# Patient Record
Sex: Male | Born: 1985 | Race: White | Hispanic: No | Marital: Single | Smoking: Current every day smoker
Health system: Southern US, Community
[De-identification: ages and names within clinical notes are randomized; demographics above are authoritative.]

## PROBLEM LIST (undated history)

## (undated) ENCOUNTER — Emergency Department (HOSPITAL_COMMUNITY): Admission: EM | Payer: MEDICAID | Source: Home / Self Care

## (undated) DIAGNOSIS — R4584 Anhedonia: Secondary | ICD-10-CM

## (undated) DIAGNOSIS — F419 Anxiety disorder, unspecified: Secondary | ICD-10-CM

## (undated) DIAGNOSIS — G4730 Sleep apnea, unspecified: Secondary | ICD-10-CM

## (undated) DIAGNOSIS — F32A Depression, unspecified: Secondary | ICD-10-CM

## (undated) DIAGNOSIS — N489 Disorder of penis, unspecified: Secondary | ICD-10-CM

## (undated) DIAGNOSIS — R410 Disorientation, unspecified: Secondary | ICD-10-CM

## (undated) DIAGNOSIS — K219 Gastro-esophageal reflux disease without esophagitis: Secondary | ICD-10-CM

## (undated) DIAGNOSIS — F909 Attention-deficit hyperactivity disorder, unspecified type: Secondary | ICD-10-CM

## (undated) DIAGNOSIS — F329 Major depressive disorder, single episode, unspecified: Secondary | ICD-10-CM

## (undated) DIAGNOSIS — R4189 Other symptoms and signs involving cognitive functions and awareness: Secondary | ICD-10-CM

## (undated) DIAGNOSIS — I10 Essential (primary) hypertension: Secondary | ICD-10-CM

## (undated) HISTORY — DX: Anxiety disorder, unspecified: F41.9

## (undated) HISTORY — DX: Disorder of penis, unspecified: N48.9

## (undated) HISTORY — PX: FRACTURE SURGERY: SHX138

## (undated) HISTORY — DX: Disorientation, unspecified: R41.0

## (undated) HISTORY — PX: HAND SURGERY: SHX662

---
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
## 2005-04-06 ENCOUNTER — Ambulatory Visit: Payer: Self-pay | Admitting: Internal Medicine

## 2005-04-12 ENCOUNTER — Ambulatory Visit: Payer: Self-pay | Admitting: Internal Medicine

## 2005-04-20 ENCOUNTER — Ambulatory Visit: Payer: Self-pay | Admitting: Internal Medicine

## 2009-07-07 ENCOUNTER — Emergency Department (HOSPITAL_COMMUNITY): Admission: EM | Admit: 2009-07-07 | Discharge: 2009-07-07 | Payer: Self-pay | Admitting: Emergency Medicine

## 2009-11-02 ENCOUNTER — Emergency Department (HOSPITAL_COMMUNITY): Admission: EM | Admit: 2009-11-02 | Discharge: 2009-11-03 | Payer: Self-pay | Admitting: Emergency Medicine

## 2009-11-03 ENCOUNTER — Inpatient Hospital Stay (HOSPITAL_COMMUNITY): Admission: AD | Admit: 2009-11-03 | Discharge: 2009-11-04 | Payer: Self-pay | Admitting: Psychiatry

## 2009-11-03 ENCOUNTER — Ambulatory Visit: Payer: Self-pay | Admitting: Psychiatry

## 2009-11-05 ENCOUNTER — Emergency Department (HOSPITAL_COMMUNITY): Admission: EM | Admit: 2009-11-05 | Discharge: 2009-11-05 | Payer: Self-pay | Admitting: Emergency Medicine

## 2010-01-25 ENCOUNTER — Ambulatory Visit: Payer: Self-pay | Admitting: Psychiatry

## 2010-05-17 ENCOUNTER — Encounter (INDEPENDENT_AMBULATORY_CARE_PROVIDER_SITE_OTHER): Payer: Self-pay | Admitting: *Deleted

## 2010-05-17 LAB — CONVERTED CEMR LAB
ALT: 19 units/L (ref 0–53)
AST: 17 units/L (ref 0–37)
Albumin: 4.5 g/dL (ref 3.5–5.2)
Alkaline Phosphatase: 68 units/L (ref 39–117)
BUN: 12 mg/dL (ref 6–23)
CO2: 30 meq/L (ref 19–32)
Calcium: 9.8 mg/dL (ref 8.4–10.5)
Chloride: 104 meq/L (ref 96–112)
Creatinine, Ser: 0.93 mg/dL (ref 0.40–1.50)
Glucose, Bld: 90 mg/dL (ref 70–99)
HCT: 47.3 % (ref 39.0–52.0)
Hemoglobin: 15.8 g/dL (ref 13.0–17.0)
MCHC: 33.4 g/dL (ref 30.0–36.0)
MCV: 94.2 fL (ref 78.0–100.0)
Platelets: 233 10*3/uL (ref 150–400)
Potassium: 4.6 meq/L (ref 3.5–5.3)
RBC: 5.02 M/uL (ref 4.22–5.81)
RDW: 14 % (ref 11.5–15.5)
Sodium: 143 meq/L (ref 135–145)
TSH: 1.214 microintl units/mL (ref 0.350–4.500)
Total Bilirubin: 0.6 mg/dL (ref 0.3–1.2)
Total Protein: 7.2 g/dL (ref 6.0–8.3)
Vit D, 25-Hydroxy: 15 ng/mL — ABNORMAL LOW (ref 30–89)
Vitamin B-12: 328 pg/mL (ref 211–911)
WBC: 10.5 10*3/uL (ref 4.0–10.5)

## 2010-05-18 ENCOUNTER — Encounter (INDEPENDENT_AMBULATORY_CARE_PROVIDER_SITE_OTHER): Payer: Self-pay | Admitting: *Deleted

## 2010-05-18 LAB — CONVERTED CEMR LAB: Testosterone: 207.45 ng/dL — ABNORMAL LOW (ref 250–890)

## 2010-05-20 ENCOUNTER — Encounter (INDEPENDENT_AMBULATORY_CARE_PROVIDER_SITE_OTHER): Payer: Self-pay | Admitting: *Deleted

## 2010-05-20 LAB — CONVERTED CEMR LAB: HIV: NONREACTIVE

## 2010-05-27 ENCOUNTER — Encounter (INDEPENDENT_AMBULATORY_CARE_PROVIDER_SITE_OTHER): Payer: Self-pay | Admitting: *Deleted

## 2010-05-27 LAB — CONVERTED CEMR LAB
Sex Hormone Binding: 39 nmol/L (ref 13–71)
Testosterone Free: 94.4 pg/mL (ref 47.0–244.0)
Testosterone-% Free: 1.9 % (ref 1.6–2.9)
Testosterone: 498.93 ng/dL (ref 250–890)

## 2010-06-11 LAB — DIFFERENTIAL
Basophils Absolute: 0 10*3/uL (ref 0.0–0.1)
Basophils Absolute: 0 K/uL (ref 0.0–0.1)
Basophils Relative: 0 % (ref 0–1)
Basophils Relative: 0 % (ref 0–1)
Eosinophils Absolute: 0 10*3/uL (ref 0.0–0.7)
Eosinophils Absolute: 0 K/uL (ref 0.0–0.7)
Eosinophils Relative: 0 % (ref 0–5)
Eosinophils Relative: 0 % (ref 0–5)
Lymphocytes Relative: 12 % (ref 12–46)
Lymphocytes Relative: 16 % (ref 12–46)
Lymphs Abs: 1.8 K/uL (ref 0.7–4.0)
Lymphs Abs: 1.9 10*3/uL (ref 0.7–4.0)
Monocytes Absolute: 1 10*3/uL (ref 0.1–1.0)
Monocytes Absolute: 1.1 K/uL — ABNORMAL HIGH (ref 0.1–1.0)
Monocytes Relative: 7 % (ref 3–12)
Monocytes Relative: 8 % (ref 3–12)
Neutro Abs: 12.4 K/uL — ABNORMAL HIGH (ref 1.7–7.7)
Neutro Abs: 8.8 10*3/uL — ABNORMAL HIGH (ref 1.7–7.7)
Neutrophils Relative %: 75 % (ref 43–77)
Neutrophils Relative %: 81 % — ABNORMAL HIGH (ref 43–77)

## 2010-06-11 LAB — CBC
HCT: 46.3 % (ref 39.0–52.0)
HCT: 48.1 % (ref 39.0–52.0)
Hemoglobin: 16.3 g/dL (ref 13.0–17.0)
Hemoglobin: 16.6 g/dL (ref 13.0–17.0)
MCH: 32.2 pg (ref 26.0–34.0)
MCH: 32.3 pg (ref 26.0–34.0)
MCHC: 34.6 g/dL (ref 30.0–36.0)
MCHC: 35.2 g/dL (ref 30.0–36.0)
MCV: 91.7 fL (ref 78.0–100.0)
MCV: 93.1 fL (ref 78.0–100.0)
Platelets: 215 K/uL (ref 150–400)
Platelets: 234 10*3/uL (ref 150–400)
RBC: 5.05 MIL/uL (ref 4.22–5.81)
RBC: 5.16 MIL/uL (ref 4.22–5.81)
RDW: 13.5 % (ref 11.5–15.5)
RDW: 13.7 % (ref 11.5–15.5)
WBC: 11.7 K/uL — ABNORMAL HIGH (ref 4.0–10.5)
WBC: 15.3 10*3/uL — ABNORMAL HIGH (ref 4.0–10.5)

## 2010-06-11 LAB — BASIC METABOLIC PANEL
BUN: 13 mg/dL (ref 6–23)
BUN: 8 mg/dL (ref 6–23)
CO2: 27 mEq/L (ref 19–32)
CO2: 28 mEq/L (ref 19–32)
Calcium: 9.5 mg/dL (ref 8.4–10.5)
Calcium: 9.5 mg/dL (ref 8.4–10.5)
Chloride: 101 mEq/L (ref 96–112)
Chloride: 102 mEq/L (ref 96–112)
Creatinine, Ser: 0.79 mg/dL (ref 0.4–1.5)
Creatinine, Ser: 1.04 mg/dL (ref 0.4–1.5)
GFR calc Af Amer: >60 mL/min (ref >60)
GFR calc Af Amer: >60 mL/min (ref >60)
GFR calc non Af Amer: >60 mL/min (ref >60)
GFR calc non Af Amer: >60 mL/min (ref >60)
Glucose, Bld: 103 mg/dL — ABNORMAL HIGH (ref 70–99)
Glucose, Bld: 99 mg/dL (ref 70–99)
Potassium: 3.8 mEq/L (ref 3.5–5.1)
Potassium: 4.1 mEq/L (ref 3.5–5.1)
Sodium: 138 mEq/L (ref 135–145)
Sodium: 139 mEq/L (ref 135–145)

## 2010-06-11 LAB — RAPID URINE DRUG SCREEN, HOSP PERFORMED
Amphetamines: NOT DETECTED
Amphetamines: NOT DETECTED
Barbiturates: NOT DETECTED
Barbiturates: NOT DETECTED
Benzodiazepines: NOT DETECTED
Benzodiazepines: NOT DETECTED
Cocaine: NOT DETECTED
Cocaine: NOT DETECTED
Opiates: NOT DETECTED
Opiates: NOT DETECTED
Tetrahydrocannabinol: POSITIVE — AB
Tetrahydrocannabinol: POSITIVE — AB

## 2010-06-11 LAB — TRICYCLICS SCREEN, URINE
TCA Scrn: NOT DETECTED
TCA Scrn: NOT DETECTED

## 2010-06-11 LAB — ACETAMINOPHEN LEVEL: Acetaminophen (Tylenol), Serum: <10 ug/mL — ABNORMAL LOW (ref 10–30)

## 2010-06-11 LAB — ETHANOL
Alcohol, Ethyl (B): 5 mg/dL (ref 0–10)
Alcohol, Ethyl (B): <5 mg/dL (ref 0–10)

## 2010-06-11 LAB — SALICYLATE LEVEL: Salicylate Lvl: <4 mg/dL (ref 2.8–20.0)

## 2010-07-30 ENCOUNTER — Emergency Department (HOSPITAL_COMMUNITY): Payer: Self-pay

## 2010-07-30 ENCOUNTER — Emergency Department (HOSPITAL_COMMUNITY)
Admission: EM | Admit: 2010-07-30 | Discharge: 2010-07-30 | Disposition: A | Discharge status: Home / Self Care | Payer: Self-pay | Attending: Emergency Medicine | Admitting: Emergency Medicine

## 2010-07-30 DIAGNOSIS — T07XXXA Unspecified multiple injuries, initial encounter: Secondary | ICD-10-CM | POA: Insufficient documentation

## 2010-07-30 DIAGNOSIS — S0010XA Contusion of unspecified eyelid and periocular area, initial encounter: Secondary | ICD-10-CM | POA: Insufficient documentation

## 2010-07-30 DIAGNOSIS — S62309A Unspecified fracture of unspecified metacarpal bone, initial encounter for closed fracture: Secondary | ICD-10-CM | POA: Insufficient documentation

## 2010-07-30 DIAGNOSIS — S43006A Unspecified dislocation of unspecified shoulder joint, initial encounter: Secondary | ICD-10-CM | POA: Insufficient documentation

## 2010-07-30 DIAGNOSIS — IMO0002 Reserved for concepts with insufficient information to code with codable children: Secondary | ICD-10-CM | POA: Insufficient documentation

## 2010-07-30 DIAGNOSIS — Y9229 Other specified public building as the place of occurrence of the external cause: Secondary | ICD-10-CM | POA: Insufficient documentation

## 2010-08-05 ENCOUNTER — Ambulatory Visit (HOSPITAL_BASED_OUTPATIENT_CLINIC_OR_DEPARTMENT_OTHER)
Admission: RE | Admit: 2010-08-05 | Discharge: 2010-08-05 | Disposition: A | Discharge status: Home / Self Care | Payer: Self-pay | Source: Ambulatory Visit | Attending: Orthopedic Surgery | Admitting: Orthopedic Surgery

## 2010-08-05 DIAGNOSIS — F17200 Nicotine dependence, unspecified, uncomplicated: Secondary | ICD-10-CM | POA: Insufficient documentation

## 2010-08-05 DIAGNOSIS — S62309A Unspecified fracture of unspecified metacarpal bone, initial encounter for closed fracture: Secondary | ICD-10-CM | POA: Insufficient documentation

## 2010-08-05 DIAGNOSIS — X58XXXA Exposure to other specified factors, initial encounter: Secondary | ICD-10-CM | POA: Insufficient documentation

## 2010-08-05 DIAGNOSIS — Z01812 Encounter for preprocedural laboratory examination: Secondary | ICD-10-CM | POA: Insufficient documentation

## 2010-08-05 LAB — BASIC METABOLIC PANEL
Calcium: 9.3 mg/dL (ref 8.4–10.5)
Creatinine, Ser: 0.56 mg/dL (ref 0.4–1.5)
GFR calc Af Amer: >60 mL/min (ref >60)
GFR calc non Af Amer: >60 mL/min (ref >60)
Sodium: 138 mEq/L (ref 135–145)

## 2010-08-05 LAB — HEPATIC FUNCTION PANEL
ALT: 29 U/L (ref 0–53)
AST: 26 U/L (ref 0–37)
Albumin: 3.7 g/dL (ref 3.5–5.2)
Alkaline Phosphatase: 77 U/L (ref 39–117)
Bilirubin, Direct: <0.1 mg/dL (ref 0.0–0.3)
Total Bilirubin: 0.2 mg/dL — ABNORMAL LOW (ref 0.3–1.2)
Total Protein: 7.5 g/dL (ref 6.0–8.3)
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
## 2010-08-13 ENCOUNTER — Ambulatory Visit: Payer: Self-pay | Attending: Orthopedic Surgery | Admitting: Occupational Therapy

## 2010-08-13 DIAGNOSIS — R5381 Other malaise: Secondary | ICD-10-CM | POA: Insufficient documentation

## 2010-08-13 DIAGNOSIS — IMO0001 Reserved for inherently not codable concepts without codable children: Secondary | ICD-10-CM | POA: Insufficient documentation

## 2010-08-13 DIAGNOSIS — M25549 Pain in joints of unspecified hand: Secondary | ICD-10-CM | POA: Insufficient documentation

## 2010-08-13 DIAGNOSIS — M2550 Pain in unspecified joint: Secondary | ICD-10-CM | POA: Insufficient documentation

## 2010-09-08 ENCOUNTER — Encounter: Payer: Self-pay | Admitting: Occupational Therapy

## 2010-10-01 ENCOUNTER — Emergency Department (HOSPITAL_COMMUNITY)
Admission: EM | Admit: 2010-10-01 | Discharge: 2010-10-01 | Disposition: A | Discharge status: Home / Self Care | Payer: Self-pay | Attending: Emergency Medicine | Admitting: Emergency Medicine

## 2010-10-01 DIAGNOSIS — I10 Essential (primary) hypertension: Secondary | ICD-10-CM | POA: Insufficient documentation

## 2010-10-01 DIAGNOSIS — L02419 Cutaneous abscess of limb, unspecified: Secondary | ICD-10-CM | POA: Insufficient documentation

## 2010-10-01 DIAGNOSIS — F341 Dysthymic disorder: Secondary | ICD-10-CM | POA: Insufficient documentation

## 2010-10-04 ENCOUNTER — Emergency Department (HOSPITAL_COMMUNITY)
Admission: EM | Admit: 2010-10-04 | Discharge: 2010-10-04 | Disposition: A | Discharge status: Home / Self Care | Payer: Self-pay | Attending: Emergency Medicine | Admitting: Emergency Medicine

## 2010-10-04 DIAGNOSIS — L02419 Cutaneous abscess of limb, unspecified: Secondary | ICD-10-CM | POA: Insufficient documentation

## 2010-10-04 DIAGNOSIS — Z09 Encounter for follow-up examination after completed treatment for conditions other than malignant neoplasm: Secondary | ICD-10-CM | POA: Insufficient documentation

## 2010-10-07 ENCOUNTER — Encounter: Payer: Self-pay | Admitting: Occupational Therapy

## 2010-10-29 ENCOUNTER — Inpatient Hospital Stay (INDEPENDENT_AMBULATORY_CARE_PROVIDER_SITE_OTHER)
Admission: RE | Admit: 2010-10-29 | Discharge: 2010-10-29 | Disposition: A | Discharge status: Home / Self Care | Payer: Self-pay | Source: Ambulatory Visit | Attending: Emergency Medicine | Admitting: Emergency Medicine

## 2010-10-29 DIAGNOSIS — L02429 Furuncle of limb, unspecified: Secondary | ICD-10-CM

## 2010-10-29 DIAGNOSIS — K069 Disorder of gingiva and edentulous alveolar ridge, unspecified: Secondary | ICD-10-CM

## 2010-11-15 ENCOUNTER — Emergency Department (HOSPITAL_COMMUNITY): Payer: Self-pay

## 2010-11-15 ENCOUNTER — Emergency Department (HOSPITAL_COMMUNITY)
Admission: EM | Admit: 2010-11-15 | Discharge: 2010-11-15 | Disposition: A | Discharge status: Home / Self Care | Payer: Self-pay | Attending: Emergency Medicine | Admitting: Emergency Medicine

## 2010-11-15 DIAGNOSIS — IMO0002 Reserved for concepts with insufficient information to code with codable children: Secondary | ICD-10-CM | POA: Insufficient documentation

## 2010-11-15 DIAGNOSIS — I10 Essential (primary) hypertension: Secondary | ICD-10-CM | POA: Insufficient documentation

## 2010-11-15 DIAGNOSIS — R0789 Other chest pain: Secondary | ICD-10-CM | POA: Insufficient documentation

## 2012-08-08 ENCOUNTER — Encounter (HOSPITAL_COMMUNITY): Payer: Self-pay | Admitting: Emergency Medicine

## 2012-08-08 ENCOUNTER — Emergency Department (HOSPITAL_COMMUNITY)
Admission: EM | Admit: 2012-08-08 | Discharge: 2012-08-08 | Disposition: A | Discharge status: Home / Self Care | Payer: Medicaid Other | Attending: Emergency Medicine | Admitting: Emergency Medicine

## 2012-08-08 DIAGNOSIS — Z8659 Personal history of other mental and behavioral disorders: Secondary | ICD-10-CM | POA: Insufficient documentation

## 2012-08-08 DIAGNOSIS — F411 Generalized anxiety disorder: Secondary | ICD-10-CM | POA: Insufficient documentation

## 2012-08-08 DIAGNOSIS — F329 Major depressive disorder, single episode, unspecified: Secondary | ICD-10-CM | POA: Insufficient documentation

## 2012-08-08 DIAGNOSIS — F17200 Nicotine dependence, unspecified, uncomplicated: Secondary | ICD-10-CM | POA: Insufficient documentation

## 2012-08-08 DIAGNOSIS — F419 Anxiety disorder, unspecified: Secondary | ICD-10-CM

## 2012-08-08 DIAGNOSIS — Z79899 Other long term (current) drug therapy: Secondary | ICD-10-CM | POA: Insufficient documentation

## 2012-08-08 DIAGNOSIS — F39 Unspecified mood [affective] disorder: Secondary | ICD-10-CM

## 2012-08-08 DIAGNOSIS — F3289 Other specified depressive episodes: Secondary | ICD-10-CM | POA: Insufficient documentation

## 2012-08-08 DIAGNOSIS — R4586 Emotional lability: Secondary | ICD-10-CM

## 2012-08-08 HISTORY — DX: Other symptoms and signs involving cognitive functions and awareness: R41.89

## 2012-08-08 HISTORY — DX: Attention-deficit hyperactivity disorder, unspecified type: F90.9

## 2012-08-08 HISTORY — DX: Depression, unspecified: F32.A

## 2012-08-08 HISTORY — DX: Major depressive disorder, single episode, unspecified: F32.9

## 2012-08-08 HISTORY — DX: Anxiety disorder, unspecified: F41.9

## 2012-08-08 LAB — COMPREHENSIVE METABOLIC PANEL
ALT: 38 U/L (ref 0–53)
AST: 25 U/L (ref 0–37)
Albumin: 4 g/dL (ref 3.5–5.2)
CO2: 24 mEq/L (ref 19–32)
Calcium: 9.2 mg/dL (ref 8.4–10.5)
Chloride: 100 mEq/L (ref 96–112)
Creatinine, Ser: 0.63 mg/dL (ref 0.50–1.35)
GFR calc non Af Amer: >90 mL/min (ref >90)
Sodium: 136 mEq/L (ref 135–145)

## 2012-08-08 LAB — CBC
MCH: 32.2 pg (ref 26.0–34.0)
Platelets: 251 10*3/uL (ref 150–400)
RBC: 5.15 MIL/uL (ref 4.22–5.81)
RDW: 12.6 % (ref 11.5–15.5)
WBC: 9.4 10*3/uL (ref 4.0–10.5)

## 2012-08-08 LAB — RAPID URINE DRUG SCREEN, HOSP PERFORMED
Amphetamines: NOT DETECTED
Barbiturates: NOT DETECTED
Cocaine: NOT DETECTED
Opiates: NOT DETECTED
Tetrahydrocannabinol: NOT DETECTED

## 2012-08-08 MED ORDER — ALUM & MAG HYDROXIDE-SIMETH 200-200-20 MG/5ML PO SUSP: 30.0000 mL | ORAL | Status: DC | PRN

## 2012-08-08 MED ORDER — SERTRALINE HCL 50 MG PO TABS
200.0000 mg | ORAL_TABLET | Freq: Every day | ORAL | Status: DC
  Administered 2012-08-08: 200 mg via ORAL
  Filled 2012-08-08: qty 4

## 2012-08-08 MED ORDER — LORAZEPAM 1 MG PO TABS
1.0000 mg | ORAL_TABLET | Freq: Once | ORAL | Status: AC
  Administered 2012-08-08: 1 mg via ORAL
  Filled 2012-08-08: qty 1

## 2012-08-08 MED ORDER — NICOTINE 21 MG/24HR TD PT24
21.0000 mg | MEDICATED_PATCH | Freq: Every day | TRANSDERMAL | Status: DC
  Administered 2012-08-08: 21 mg via TRANSDERMAL
  Filled 2012-08-08: qty 1

## 2012-08-08 MED ORDER — IBUPROFEN 600 MG PO TABS: 600.0000 mg | ORAL_TABLET | Freq: Three times a day (TID) | ORAL | Status: DC | PRN

## 2012-08-08 MED ORDER — GABAPENTIN 400 MG PO CAPS
800.0000 mg | ORAL_CAPSULE | Freq: Four times a day (QID) | ORAL | Status: DC
  Administered 2012-08-08: 800 mg via ORAL
  Filled 2012-08-08 (×5): qty 2

## 2012-08-08 MED ORDER — ZOLPIDEM TARTRATE 5 MG PO TABS: 5.0000 mg | ORAL_TABLET | Freq: Every evening | ORAL | Status: DC | PRN

## 2012-08-08 MED ORDER — LORAZEPAM 1 MG PO TABS: 1.0000 mg | ORAL_TABLET | Freq: Three times a day (TID) | ORAL | Status: DC | PRN

## 2012-08-08 MED ORDER — CLONAZEPAM 1 MG PO TABS: 1.0000 mg | ORAL_TABLET | Freq: Two times a day (BID) | ORAL | Status: DC | PRN

## 2012-08-08 MED ORDER — ONDANSETRON HCL 4 MG PO TABS: 4.0000 mg | ORAL_TABLET | Freq: Three times a day (TID) | ORAL | Status: DC | PRN

## 2012-08-08 MED ORDER — ACETAMINOPHEN 325 MG PO TABS: 650.0000 mg | ORAL_TABLET | ORAL | Status: DC | PRN

## 2012-08-08 NOTE — ED Provider Notes (Signed)
History     CSN: [id number]  Arrival date & time 08/08/12  4098   First MD Initiated Contact with Patient 08/08/12 [phone]      Chief Complaint  Patient presents with  . Medical Clearance    (Consider location/radiation/quality/duration/timing/severity/associated sxs/prior treatment) HPI  27 year old male with history of anxiety disorder, ADHD, and depression presents for evaluations of mood instability. Patient reports 9 months ago he took bathsalt and had a psychotic breakdown from it. Since then he has been having worsening mood instability. Describe sensations impending doom, having difficulty thinking, hearing popping noise from his head, feeling very anxious and constantly having suicidal thoughts without specific plan. States he has been living in a shed behind his aunt's house and having lack of motivation to do anything, in fear that it may brought on a psychosis episode.  He has been actively seeking for help with his doctors, and also have been on several different psychiatric medications that was prescribed to him which has not helped.  Pt sts he is having increase difficulty thinking and the "popping noise" from his head has increased in frequency, prompting him to come to ER for help.  Report having a CT scan for this popping noise 9 months ago, and it was normal.  Sts he is a social drinker, last alcohol use was yesterday. Denies any recent rec drug use.  Denies homicidal ideation.  Pt has been managed by [employer].    Past Medical History  Diagnosis Date  . Depression   . ADHD (attention deficit hyperactivity disorder)   . Anxiety disorder   . Disorganized thought process     Past Surgical History  Procedure Laterality Date  . Hand surgery      Family History  Problem Relation Age of Onset  . Hypertension Mother   . Diabetes Mother   . Cancer Father   . Arthritis Father   . Diabetes Other     History  Substance Use Topics  . Smoking status: Current Every Day  Smoker    Types: Cigarettes  . Smokeless tobacco: Not on file  . Alcohol Use: Yes     Comment: occ      Review of Systems  Constitutional:       A complete 10 system review of systems was obtained and all systems are negative except as noted in the HPI and PMH.    Allergies  Other  Home Medications   Current Outpatient Rx  Name  Route  Sig  Dispense  Refill  . gabapentin (NEURONTIN) 800 MG tablet   Oral   Take 800 mg by mouth 4 (four) times daily.         . sertraline (ZOLOFT) 100 MG tablet   Oral   Take 200 mg by mouth daily.         . Thiamine HCl (VITAMIN B-1 PO)   Oral   Take by mouth.           BP 161/109  Pulse 76  Temp(Src) 98.6 F (37 C) (Oral)  Resp 20  SpO2 98%  Physical Exam  Nursing note and vitals reviewed. Constitutional: He is oriented to person, place, and time. He appears well-developed and well-nourished. No distress.  Awake, alert, nontoxic appearance  HENT:  Head: Atraumatic.  Mouth/Throat: Oropharynx is clear and moist.  Eyes: Conjunctivae and EOM are normal. Pupils are equal, round, and reactive to light. Right eye exhibits no discharge. Left eye exhibits no discharge.  Neck: Normal range of  motion. Neck supple.  Cardiovascular: Normal rate and regular rhythm.   Pulmonary/Chest: Effort normal. No respiratory distress. He exhibits no tenderness.  Abdominal: Soft. There is no tenderness. There is no rebound.  Musculoskeletal: He exhibits no tenderness.  ROM appears intact, no obvious focal weakness  Neurological: He is alert and oriented to person, place, and time. He has normal strength. No cranial nerve deficit or sensory deficit. He displays a negative Romberg sign. Coordination and gait normal. GCS eye subscore is 4. GCS verbal subscore is 5. GCS motor subscore is 6.  Skin: Skin is warm and dry. No rash noted.  Psychiatric: He has a normal mood and affect. His speech is normal and behavior is normal. Thought content is paranoid  and delusional.    ED Course  Procedures (including critical care time)  7:59 AM Pt report not feeling himself since taking bathsalt 9 months ago. Is anxious, afraid of relapsing into another psychosis.  Having SI without plan, persistent.  Seeking help.    10:46 AM Psych hold and med rec filled.  Discussed with ACT.  My attending has evaluate pt.  Move to psych ED, telepsych placed.      Labs Reviewed  COMPREHENSIVE METABOLIC PANEL - Abnormal; Notable for the following:    Glucose, Bld 147 (*)    Total Bilirubin 0.2 (*)    All other components within normal limits  CBC  ETHANOL  URINE RAPID DRUG SCREEN (HOSP PERFORMED)   No results found.   1. Mood changes   2. Anxiety       MDM  BP 141/93  Pulse 67  Temp(Src) 98.2 F (36.8 C) (Oral)  Resp 18  SpO2 99%         Fayrene Helper, PA-C 08/08/12 1047

## 2012-08-08 NOTE — [facility] Suicide Risk Assessment (Signed)
Suicide Risk Assessment  Discharge Assessment     Demographic Factors:  Male, Adolescent or young adult, Caucasian, Low socioeconomic status and Unemployed  Mental Status Per Nursing Assessment::   On Admission:     Current Mental Status by Physician: Self-harm thoughts and NA  Loss Factors: Decrease in vocational status and Financial problems/change in socioeconomic status  Historical Factors: Impulsivity and NA  Risk Reduction Factors:   Sense of responsibility to family, Religious beliefs about death, Living with another person, especially a relative, Positive social support and Positive therapeutic relationship  Continued Clinical Symptoms:  Severe Anxiety and/or Agitation Depression:   Anhedonia Impulsivity Recent sense of peace/wellbeing Severe Alcohol/Substance Abuse/Dependencies Previous Psychiatric Diagnoses and Treatments  Cognitive Features That Contribute To Risk:  Closed-mindedness Polarized thinking    Suicide Risk:  Mild:  Suicidal ideation of limited frequency, intensity, duration, and specificity.  There are no identifiable plans, no associated intent, mild dysphoria and related symptoms, good self-control (both objective and subjective assessment), few other risk factors, and identifiable protective factors, including available and accessible social support.  Discharge Diagnoses:   AXIS I:  Major Depression, Recurrent severe, Panic Disorder, Substance Abuse and Substance Induced Mood Disorder AXIS II:  Cluster B Traits AXIS III:   Past Medical History  Diagnosis Date  . Depression   . ADHD (attention deficit hyperactivity disorder)   . Anxiety disorder   . Disorganized thought process    AXIS IV:  economic problems, occupational problems, other psychosocial or environmental problems, problems related to social environment and problems with primary support group AXIS V:  51-60 moderate symptoms  Plan Of Care/Follow-up recommendations:  Activity:   As tolerated Diet:  Regular  Is patient on multiple antipsychotic therapies at discharge:  No   Has Patient had three or more failed trials of antipsychotic monotherapy by history:  No  Recommended Plan for Multiple Antipsychotic Therapies: Not applicable  Carlisle Enke,JANARDHAHA R. 08/08/2012, 3:17 PM
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
## 2012-08-08 NOTE — ED Notes (Signed)
Waiting for MD evaluation.

## 2012-08-08 NOTE — ED Notes (Signed)
Pt states he had some kind of psychotic episode about ago  Pt states he overdosed on adderal at that time  Pt states from the past he has been living in a shed behind his aunts house and has no will to go anywhere or do anything  Pt states he has been thinking of suicide for the past 9 mths  Pt states he has been hearing "popping noises" in his head and has a pain in his head not like a headache but like he has an infection in his head  Pt speaks fast and states he is feeling very anxious  Pt is on medications and states they are managed by [facility]   Pt states he is seen somewhere else too but unclear where it is  Pt states he is having difficulty processing his thoughts and getting them organized

## 2012-08-08 NOTE — ED Provider Notes (Signed)
He is being assessed for anxiety and his own thoughts that he might have psychosis. He describes having trouble arranging his thoughts, knowing where he is going and is worried about having schizophrenia. He would like to have a brain scan to diagnose schizophrenia. He follows regularly at [facility] where he is treated with medication.  The patient has been medically cleared.  A tele- psychiatric consultation is in process.    Flint Melter, MD 08/08/12 [phone]

## 2012-08-08 NOTE — Progress Notes (Signed)
ED CM reviewed EPIC notes and imaging.  Noted pt c/o "popping noise" in his head. CM spoke with EDP, Effie Shy, questioning if popping noise is neurological in nature requiring imaging studies.  EDP voiced pt's "popping noise" is "old complaint", mentioned previously by pt and does not feel need for imaging

## 2012-08-08 NOTE — ED Notes (Signed)
Patient requesting something for anxiety, MD made aware

## 2012-08-08 NOTE — ED Notes (Signed)
Patient states it is hard for him to concentrate and remember things talked about minutes before; states he has thoughts of SI, denies HI and no VH, states he hears voices but thinks of times he used to impersonate others, he hears those voices talking to him when in the grocery store "why are you standing there", "why did you take that".... doesn't feel like getting up OOB and going outside, sun bothers him, he's not lazy, he just can't get up and get going. Doesn't have a job, doesn't go to school and is about to be homeless. UDS is negative. States he takes Zoloft and neurontin and this helped him prior to OD on Adderal 9 months ago but since OD has not helped him as well.

## 2012-08-08 NOTE — Consult Note (Signed)
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 Vivi Barrack is an 27 y.o. male.  HPI: Patient was seen and chart reviewed. Patient came to the [facility] long emergency department requesting medication management for depression and anxiety. Patient reported his anxiety was increased since his medication Zoloft was increased to 200 mg daily about a month ago. Patient endorses suicidal ideation without intentions or plans. Patient contracts for safety and wishes to get medications and follow up with outpatient care.   Pt reports he occasionally drinks a 6 pack once a month or less. 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 Patient reports symptoms of depression including: isolating, anhedonia, despondent, feelings of worthlessness, fatigue, decreased appetite with increased eating, and insomnia. Patient reports he has been compliant with his medications of zoloft and neurtonin as prescribed but feels that the medications do not seem to be working. Patient reports working for [employer] until October 2013.  Mental Status Examination: Patient appeared as per his stated age, dressed in hospital scrubs, and fairly groomed, and maintaining good eye contact. Patient has depressed and anxious mood and his affect was appropriate. He has normal rate, rhythm, and volume of speech. His thought process is linear and goal directed. Patient has chronic suicidal but denies intentions and plans. He has denied homicidal ideations, intentions or plans. Patient has no evidence of auditory or visual hallucinations, delusions, and paranoia. Patient has poor insight judgment and impulse control.  Past Medical History  Diagnosis Date  . Depression   .  ADHD (attention deficit hyperactivity disorder)   . Anxiety disorder   . Disorganized thought process     Past Surgical History  Procedure Laterality Date  . Hand surgery      Family History  Problem Relation Age of Onset  . Hypertension Mother   . Diabetes Mother   . Cancer Father   . Arthritis Father   . Diabetes Other     Social History:  reports that he has been smoking Cigarettes.  He has been smoking about 0.00 packs per day. He does not have any smokeless tobacco history on file. He reports that  drinks alcohol. He reports that he does not use illicit drugs.  Allergies:  Allergies  Allergen Reactions  . Other Other (See Comments)    Can not take adderall because of accidental overdose    Medications: I have reviewed the patient's current medications.  Results for orders placed during the hospital encounter of 08/08/12 (from the past 48 hour(s))  CBC     Status: None   Collection Time    08/08/12  3:21 AM      Result Value Range   WBC 9.4  4.0 - 10.5 K/uL   RBC 5.15  4.22 - 5.81 MIL/uL   Hemoglobin 16.6  13.0 - 17.0 g/dL   HCT 47.8  29.5 - 62.1 %   MCV 89.7  78.0 - 100.0 fL   MCH 32.2  26.0 - 34.0 pg   MCHC 35.9  30.0 - 36.0 g/dL   RDW 30.8  65.7 - 84.6 %   Platelets 251  150 - 400 K/uL  COMPREHENSIVE METABOLIC PANEL     Status: Abnormal   Collection Time    08/08/12  3:21 AM  Result Value Range   Sodium 136  135 - 145 mEq/L   Potassium 3.6  3.5 - 5.1 mEq/L   Chloride 100  96 - 112 mEq/L   CO2 24  19 - 32 mEq/L   Glucose, Bld 147 (*) 70 - 99 mg/dL   BUN 13  6 - 23 mg/dL   Creatinine, Ser 1.61  0.50 - 1.35 mg/dL   Calcium 9.2  8.4 - 09.6 mg/dL   Total Protein 7.9  6.0 - 8.3 g/dL   Albumin 4.0  3.5 - 5.2 g/dL   AST 25  0 - 37 U/L   ALT 38  0 - 53 U/L   Alkaline Phosphatase 85  39 - 117 U/L   Total Bilirubin 0.2 (*) 0.3 - 1.2 mg/dL   GFR calc non Af Amer >90  >90 mL/min   GFR calc Af Amer >90  >90 mL/min   Comment:            The eGFR has been  calculated     using the CKD EPI equation.     This calculation has not been     validated in all clinical     situations.     eGFR's persistently     <90 mL/min signify     possible Chronic Kidney Disease.  ETHANOL     Status: None   Collection Time    08/08/12  3:21 AM      Result Value Range   Alcohol, Ethyl (B) <11  0 - 11 mg/dL   Comment:            LOWEST DETECTABLE LIMIT FOR     SERUM ALCOHOL IS 11 mg/dL     FOR MEDICAL PURPOSES ONLY  URINE RAPID DRUG SCREEN (HOSP PERFORMED)     Status: None   Collection Time    08/08/12  6:30 AM      Result Value Range   Opiates NONE DETECTED  NONE DETECTED   Cocaine NONE DETECTED  NONE DETECTED   Benzodiazepines NONE DETECTED  NONE DETECTED   Amphetamines NONE DETECTED  NONE DETECTED   Tetrahydrocannabinol NONE DETECTED  NONE DETECTED   Barbiturates NONE DETECTED  NONE DETECTED   Comment:            DRUG SCREEN FOR MEDICAL PURPOSES     ONLY.  IF CONFIRMATION IS NEEDED     FOR ANY PURPOSE, NOTIFY LAB     WITHIN 5 DAYS.                LOWEST DETECTABLE LIMITS     FOR URINE DRUG SCREEN     Drug Class       Cutoff (ng/mL)     Amphetamine      1000     Barbiturate      200     Benzodiazepine   200     Tricyclics       300     Opiates          300     Cocaine          300     THC              50    No results found.  Positive for anxiety, bad mood, behavior problems, depression, illegal drug usage, learning difficulty and sleep disturbance Blood pressure 124/82, pulse 68, temperature 97.5 F (36.4 C), temperature source Oral, resp. rate 16, SpO2 97.00%.   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 Will start clonazepam 1 mg twice daily for anxiety and continue taking his current home  medication as it is.  Calisa Luckenbaugh,JANARDHAHA R. 08/08/2012, 12:46 PM

## 2012-08-08 NOTE — ED Notes (Signed)
Patient and his belongings (Clothes, shoes, two pill bottles, and insurance card) checked by security.

## 2012-08-08 NOTE — ED Notes (Signed)
Pt requesting for something to help with his nerves. Lauree Chandler PA aware. New orders given.

## 2012-08-09 NOTE — ED Provider Notes (Signed)
Medical screening examination/treatment/procedure(s) were performed by non-physician practitioner and as supervising physician I was immediately available for consultation/collaboration.  Sunnie Nielsen, MD 08/09/12 [phone]

## 2012-09-12 ENCOUNTER — Encounter (HOSPITAL_COMMUNITY): Payer: Self-pay | Admitting: *Deleted

## 2012-09-12 ENCOUNTER — Emergency Department (HOSPITAL_COMMUNITY)
Admission: EM | Admit: 2012-09-12 | Discharge: 2012-09-12 | Disposition: A | Discharge status: Home / Self Care | Payer: Medicaid Other | Attending: Emergency Medicine | Admitting: Emergency Medicine

## 2012-09-12 DIAGNOSIS — F411 Generalized anxiety disorder: Secondary | ICD-10-CM | POA: Diagnosis present

## 2012-09-12 DIAGNOSIS — F17200 Nicotine dependence, unspecified, uncomplicated: Secondary | ICD-10-CM | POA: Insufficient documentation

## 2012-09-12 DIAGNOSIS — R450 Nervousness: Secondary | ICD-10-CM | POA: Insufficient documentation

## 2012-09-12 DIAGNOSIS — F909 Attention-deficit hyperactivity disorder, unspecified type: Secondary | ICD-10-CM | POA: Insufficient documentation

## 2012-09-12 DIAGNOSIS — Z79899 Other long term (current) drug therapy: Secondary | ICD-10-CM | POA: Insufficient documentation

## 2012-09-12 DIAGNOSIS — I10 Essential (primary) hypertension: Secondary | ICD-10-CM | POA: Insufficient documentation

## 2012-09-12 DIAGNOSIS — R29818 Other symptoms and signs involving the nervous system: Secondary | ICD-10-CM | POA: Insufficient documentation

## 2012-09-12 DIAGNOSIS — F329 Major depressive disorder, single episode, unspecified: Secondary | ICD-10-CM

## 2012-09-12 DIAGNOSIS — F1910 Other psychoactive substance abuse, uncomplicated: Secondary | ICD-10-CM | POA: Insufficient documentation

## 2012-09-12 DIAGNOSIS — R45851 Suicidal ideations: Secondary | ICD-10-CM | POA: Insufficient documentation

## 2012-09-12 DIAGNOSIS — G4700 Insomnia, unspecified: Secondary | ICD-10-CM | POA: Insufficient documentation

## 2012-09-12 LAB — CBC WITH DIFFERENTIAL/PLATELET
Basophils Relative: 0 % (ref 0–1)
Eosinophils Absolute: 0.1 10*3/uL (ref 0.0–0.7)
Eosinophils Relative: 1 % (ref 0–5)
Hemoglobin: 16.6 g/dL (ref 13.0–17.0)
Lymphs Abs: 2.4 10*3/uL (ref 0.7–4.0)
MCH: 31.9 pg (ref 26.0–34.0)
MCHC: 35.9 g/dL (ref 30.0–36.0)
MCV: 89 fL (ref 78.0–100.0)
Monocytes Absolute: 1 10*3/uL (ref 0.1–1.0)
Monocytes Relative: 9 % (ref 3–12)
Neutrophils Relative %: 70 % (ref 43–77)
RBC: 5.2 MIL/uL (ref 4.22–5.81)

## 2012-09-12 LAB — SALICYLATE LEVEL: Salicylate Lvl: <2 mg/dL — ABNORMAL LOW (ref 2.8–20.0)

## 2012-09-12 LAB — BASIC METABOLIC PANEL
BUN: 9 mg/dL (ref 6–23)
Calcium: 9.9 mg/dL (ref 8.4–10.5)
Creatinine, Ser: 0.76 mg/dL (ref 0.50–1.35)
GFR calc Af Amer: >90 mL/min (ref >90)
GFR calc non Af Amer: >90 mL/min (ref >90)
Glucose, Bld: 116 mg/dL — ABNORMAL HIGH (ref 70–99)

## 2012-09-12 LAB — RAPID URINE DRUG SCREEN, HOSP PERFORMED
Barbiturates: NOT DETECTED
Cocaine: NOT DETECTED
Tetrahydrocannabinol: NOT DETECTED

## 2012-09-12 MED ORDER — LORAZEPAM 1 MG PO TABS
1.0000 mg | ORAL_TABLET | Freq: Three times a day (TID) | ORAL | Status: DC | PRN
  Administered 2012-09-12: 1 mg via ORAL
  Filled 2012-09-12: qty 1

## 2012-09-12 MED ORDER — ACETAMINOPHEN 325 MG PO TABS: 650.0000 mg | ORAL_TABLET | ORAL | Status: DC | PRN

## 2012-09-12 MED ORDER — ZOLPIDEM TARTRATE 5 MG PO TABS: 5.0000 mg | ORAL_TABLET | Freq: Every evening | ORAL | Status: DC | PRN

## 2012-09-12 MED ORDER — TRAZODONE 25 MG HALF TABLET: 25.0000 mg | ORAL_TABLET | Freq: Every evening | ORAL | Status: DC | PRN

## 2012-09-12 MED ORDER — ALUM & MAG HYDROXIDE-SIMETH 200-200-20 MG/5ML PO SUSP: 30.0000 mL | ORAL | Status: DC | PRN

## 2012-09-12 MED ORDER — VENLAFAXINE HCL 37.5 MG PO TABS: 37.5000 mg | ORAL_TABLET | Freq: Two times a day (BID) | ORAL | Status: DC

## 2012-09-12 MED ORDER — IBUPROFEN 600 MG PO TABS: 600.0000 mg | ORAL_TABLET | Freq: Three times a day (TID) | ORAL | Status: DC | PRN

## 2012-09-12 MED ORDER — TRAZODONE HCL 50 MG PO TABS: 25.0000 mg | ORAL_TABLET | Freq: Every evening | ORAL | Status: DC | PRN

## 2012-09-12 MED ORDER — ONDANSETRON HCL 4 MG PO TABS: 4.0000 mg | ORAL_TABLET | Freq: Three times a day (TID) | ORAL | Status: DC | PRN

## 2012-09-12 MED ORDER — VENLAFAXINE HCL 37.5 MG PO TABS
37.5000 mg | ORAL_TABLET | Freq: Two times a day (BID) | ORAL | Status: DC
  Filled 2012-09-12 (×2): qty 1

## 2012-09-12 NOTE — Consult Note (Signed)
Reason for Consult: Evaluation for inpatient treatment Referring Physician:  EDP  Ricky Wu is an 27 y.o. male.  HPI: Patient presents to [facility] with complaints of increased anxiety and feeling psychotic.  Patient states that he has been trying to taper himself off of his Zoloft and Gabapentin.  States that he is coming off of the medication related to medication causing sexual dysfunction.  Patient states since the taper he has been having night mares; unable to sleep; and anxiety greater that 10 on a scale of 10/10.  Patient states that he has tried multiple psychotropic medications and has not had one that did not cause sexual dysfunction.  Patient also states that he has had SI thoughts and dreams but no actual plan.  Patient denies HI/AVH, but is paranoid about the dreams and the anxiety is making him feel like he is psychotic.  Patient expresses that he feels that he can handle things on a outpatient basis just want to make sure he would have resources to help him with the taper of medications and a medication that would not cause sexual dysfunction that would also help with anxiety.  Patient states that being on a closed unit would probably increase his anxiety.   Patient willing to work with a ACT team that will see daily and Psychiatrist to manage medication at least weekly.    Past Medical History  Diagnosis Date  . Depression   . ADHD (attention deficit hyperactivity disorder)   . Anxiety disorder   . Disorganized thought process     Past Surgical History  Procedure Laterality Date  . Hand surgery      Family History  Problem Relation Age of Onset  . Hypertension Mother   . Diabetes Mother   . Cancer Father   . Arthritis Father   . Diabetes Other     Social History:  reports that he has been smoking Cigarettes.  He has been smoking about 0.00 packs per day. He does not have any smokeless tobacco history on file. He reports that  drinks alcohol. He reports that he does not  use illicit drugs.  Allergies:  Allergies  Allergen Reactions  . Other Other (See Comments)    Can not take adderall because of accidental overdose    Medications: I have reviewed the patient's current medications.  Results for orders placed during the hospital encounter of 09/12/12 (from the past 48 hour(s))  URINE RAPID DRUG SCREEN (HOSP PERFORMED)     Status: None   Collection Time    09/12/12  6:09 AM      Result Value Range   Opiates NONE DETECTED  NONE DETECTED   Cocaine NONE DETECTED  NONE DETECTED   Benzodiazepines NONE DETECTED  NONE DETECTED   Amphetamines NONE DETECTED  NONE DETECTED   Tetrahydrocannabinol NONE DETECTED  NONE DETECTED   Barbiturates NONE DETECTED  NONE DETECTED   Comment:            DRUG SCREEN FOR MEDICAL PURPOSES     ONLY.  IF CONFIRMATION IS NEEDED     FOR ANY PURPOSE, NOTIFY LAB     WITHIN 5 DAYS.                LOWEST DETECTABLE LIMITS     FOR URINE DRUG SCREEN     Drug Class       Cutoff (ng/mL)     Amphetamine      1000     Barbiturate  200     Benzodiazepine   200     Tricyclics       300     Opiates          300     Cocaine          300     THC              50  CBC WITH DIFFERENTIAL     Status: Abnormal   Collection Time    09/12/12  6:30 AM      Result Value Range   WBC 11.8 (*) 4.0 - 10.5 K/uL   RBC 5.20  4.22 - 5.81 MIL/uL   Hemoglobin 16.6  13.0 - 17.0 g/dL   HCT 30.8  65.7 - 84.6 %   MCV 89.0  78.0 - 100.0 fL   MCH 31.9  26.0 - 34.0 pg   MCHC 35.9  30.0 - 36.0 g/dL   RDW 96.2  95.2 - 84.1 %   Platelets 273  150 - 400 K/uL   Neutrophils Relative % 70  43 - 77 %   Neutro Abs 8.2 (*) 1.7 - 7.7 K/uL   Lymphocytes Relative 21  12 - 46 %   Lymphs Abs 2.4  0.7 - 4.0 K/uL   Monocytes Relative 9  3 - 12 %   Monocytes Absolute 1.0  0.1 - 1.0 K/uL   Eosinophils Relative 1  0 - 5 %   Eosinophils Absolute 0.1  0.0 - 0.7 K/uL   Basophils Relative 0  0 - 1 %   Basophils Absolute 0.0  0.0 - 0.1 K/uL  BASIC METABOLIC PANEL      Status: Abnormal   Collection Time    09/12/12  6:30 AM      Result Value Range   Sodium 137  135 - 145 mEq/L   Potassium 3.9  3.5 - 5.1 mEq/L   Chloride 100  96 - 112 mEq/L   CO2 24  19 - 32 mEq/L   Glucose, Bld 116 (*) 70 - 99 mg/dL   BUN 9  6 - 23 mg/dL   Creatinine, Ser 3.24  0.50 - 1.35 mg/dL   Calcium 9.9  8.4 - 40.1 mg/dL   GFR calc non Af Amer >90  >90 mL/min   GFR calc Af Amer >90  >90 mL/min   Comment:            The eGFR has been calculated     using the CKD EPI equation.     This calculation has not been     validated in all clinical     situations.     eGFR's persistently     <90 mL/min signify     possible Chronic Kidney Disease.  ETHANOL     Status: None   Collection Time    09/12/12  6:30 AM      Result Value Range   Alcohol, Ethyl (B) <11  0 - 11 mg/dL   Comment:            LOWEST DETECTABLE LIMIT FOR     SERUM ALCOHOL IS 11 mg/dL     FOR MEDICAL PURPOSES ONLY  SALICYLATE LEVEL     Status: Abnormal   Collection Time    09/12/12  6:30 AM      Result Value Range   Salicylate Lvl <2.0 (*) 2.8 - 20.0 mg/dL  ACETAMINOPHEN LEVEL     Status: None   Collection  Time    09/12/12  6:30 AM      Result Value Range   Acetaminophen (Tylenol), Serum <15.0  10 - 30 ug/mL   Comment:            THERAPEUTIC CONCENTRATIONS VARY     SIGNIFICANTLY. A RANGE OF 10-30     ug/mL MAY BE AN EFFECTIVE     CONCENTRATION FOR MANY PATIENTS.     HOWEVER, SOME ARE BEST TREATED     AT CONCENTRATIONS OUTSIDE THIS     RANGE.     ACETAMINOPHEN CONCENTRATIONS     >150 ug/mL AT 4 HOURS AFTER     INGESTION AND >50 ug/mL AT 12     HOURS AFTER INGESTION ARE     OFTEN ASSOCIATED WITH TOXIC     REACTIONS.    No results found.  Review of Systems  Constitutional: Negative.   HENT: Negative.   Eyes: Negative.   Respiratory: Negative.   Cardiovascular: Negative.   Gastrointestinal: Negative.   Genitourinary: Negative.   Musculoskeletal: Negative.   Neurological: Negative.    Psychiatric/Behavioral: Positive for depression, suicidal ideas and substance abuse. Negative for hallucinations. The patient is nervous/anxious and has insomnia.    Blood pressure 132/90, pulse 74, temperature 97.4 F (36.3 C), temperature source Oral, resp. rate 18, SpO2 97.00%. Physical Exam  Constitutional: He is oriented to person, place, and time. He appears well-developed.  HENT:  Head: Normocephalic.  Eyes: Pupils are equal, round, and reactive to light.  Neck: Normal range of motion.  Respiratory: Effort normal.  Musculoskeletal: Normal range of motion.  Neurological: He is alert and oriented to person, place, and time.  Skin: Skin is warm and dry.  Psychiatric: His speech is normal and behavior is normal. His mood appears anxious. Thought content is paranoid. He expresses suicidal ideation. He expresses no homicidal ideation. He expresses no suicidal plans.  Patient states that increased anxiety since he has started to trapper off of RX anxiety medication (Zoloft and Gabapentin).      Assessment/Plan:  Face to face interview and consult with Dr. Lolly Mustache Recommendation:  Outpatient treatment 1. CW will set up ACT or other outpatient service that will be able to follow patient daily for next couple      of weeks with psychiatrist to see at least weekly and manage patient medication.   2. Start Effexor 37.5 mg BID for anxiety and Trazodone 25 mg PRN QHS (may repeat dose once) for       Insomnia.  Patient accepted for [facility] with [facility] Appointment at his home 09/13/2012 10:00 AM Contact:  Risa Grill [phone]   Assunta Found, FNP-BC 09/12/2012, 12:48 PM     I personally seen the patient agreed with the findings and involved in the treatment plan.

## 2012-09-12 NOTE — ED Notes (Addendum)
Pt states having feelings of anxiety that make him feel psychotic; pt states he has been trying to reset his schedule because he is sleeping all day; tried not taking his neurontin for 3 doses to help him sleep--tried staying up all day; states slept a few hours and woke up with psychotic scary nightmares; pt states when it happens he feels unbearable and he starts to "freak out"; states anxiety/panic attacks will come in waves; he feels like he is going in and out of reality; pt states has been having thoughts of hurting himself because he can't stand this feeling when it wont go away; states about a month ago sat with a  Razor blade and considered hurting himself; pt states he is afraid his feeling will return and he will have to kill himself because he can't take the pain

## 2012-09-12 NOTE — Progress Notes (Signed)
Per discussion with NP, Patient was evaluated and psychiatrically stable for discharge with a community act team. CSW and [facility] spoke with patient who agreed to a community act team through [place] where patient is already a patient. Pt discharge pending arrangement of act team and appointment. [facility] arranging [facility] act team.   .Catha Gosselin, Theresia Majors  [phone] .09/12/2012 1036am

## 2012-09-12 NOTE — ED Notes (Signed)
Pt changed into scrubs; wanded by security 

## 2012-09-12 NOTE — ED Provider Notes (Signed)
History     CSN: [id number]  Arrival date & time 09/12/12  0540   First MD Initiated Contact with Patient 09/12/12 [phone]      Chief Complaint  Patient presents with  . Medical Clearance    (Consider location/radiation/quality/duration/timing/severity/associated sxs/prior treatment) HPI Comments: Patient is a 27 year old male with a past medical history of anxiety, depression, and ADHD who presents with unmanageable anxiety for the past 24 hours. Patient reports trying to "reset his sleep schedule" so he can sleep during the night and be awake during the day. He took 50mg  of benadryl last night to try to sleep and did not take any medications yesterday. Patient states he has been awake for 24 hours. Patient reports a history of "surreal" experiences where he is no longer in reality, which he felt like was going to happen tonight. He doesn't like these feelings because he states there is an "evil" component to them where he feels as though the devil is with him. He wants to avoid experiencing these feelings so he considered hurting himself. He reports he would shoot himself in the head with a shotgun, hang himself or cut himself with a razor blade. No homicidal ideations. Patient denies illicit drug use.    Past Medical History  Diagnosis Date  . Depression   . ADHD (attention deficit hyperactivity disorder)   . Anxiety disorder   . Disorganized thought process     Past Surgical History  Procedure Laterality Date  . Hand surgery      Family History  Problem Relation Age of Onset  . Hypertension Mother   . Diabetes Mother   . Cancer Father   . Arthritis Father   . Diabetes Other     History  Substance Use Topics  . Smoking status: Current Every Day Smoker    Types: Cigarettes  . Smokeless tobacco: Not on file  . Alcohol Use: Yes     Comment: occ      Review of Systems  Psychiatric/Behavioral: Positive for suicidal ideas. The patient is nervous/anxious.   All other  systems reviewed and are negative.    Allergies  Other  Home Medications   Current Outpatient Rx  Name  Route  Sig  Dispense  Refill  . diphenhydrAMINE (BENADRYL) 25 MG tablet   Oral   Take 50 mg by mouth every 6 (six) hours as needed for sleep.         Marland Kitchen gabapentin (NEURONTIN) 800 MG tablet   Oral   Take 800 mg by mouth 4 (four) times daily.         . Melatonin 3 MG TABS   Oral   Take 3 mg by mouth at bedtime.         . niacin 500 MG tablet   Oral   Take 500 mg by mouth daily with breakfast.         . sertraline (ZOLOFT) 100 MG tablet   Oral   Take 200 mg by mouth daily.           BP 130/97  Pulse 88  Temp(Src) 99.6 F (37.6 C)  Resp 20  SpO2 96%  Physical Exam  Nursing note and vitals reviewed. Constitutional: He is oriented to person, place, and time. He appears well-developed and well-nourished. No distress.  HENT:  Head: Normocephalic and atraumatic.  Eyes: Conjunctivae and EOM are normal.  Neck: Normal range of motion.  Cardiovascular: Normal rate and regular rhythm.  Exam reveals no  gallop and no friction rub.   No murmur heard. Pulmonary/Chest: Effort normal and breath sounds normal. He has no wheezes. He has no rales. He exhibits no tenderness.  Abdominal: Soft. He exhibits no distension. There is no tenderness. There is no rebound and no guarding.  Musculoskeletal: Normal range of motion.  Neurological: He is alert and oriented to person, place, and time.  Speech is goal-oriented. Moves limbs without ataxia.   Skin: Skin is warm and dry.  Psychiatric:  Dysphoric mood.     ED Course  Procedures (including critical care time)  Labs Reviewed  CBC WITH DIFFERENTIAL - Abnormal; Notable for the following:    WBC 11.8 (*)    Neutro Abs 8.2 (*)    All other components within normal limits  BASIC METABOLIC PANEL - Abnormal; Notable for the following:    Glucose, Bld 116 (*)    All other components within normal limits  SALICYLATE LEVEL  - Abnormal; Notable for the following:    Salicylate Lvl <2.0 (*)    All other components within normal limits  ETHANOL  URINE RAPID DRUG SCREEN (HOSP PERFORMED)  ACETAMINOPHEN LEVEL   No results found.   1. GAD (generalized anxiety disorder)   2. Suicidal ideation       MDM  6:17 AM Labs pending.        Emilia Beck, PA-C 09/12/12 1529

## 2012-09-12 NOTE — Progress Notes (Signed)
Pt states he has not seen a pcp in a long time No medicaid card scanned in [facility] ED CM spoke with pt on how to a obtain an in network medicaid pcp via DSS

## 2012-09-12 NOTE — ED Notes (Signed)
Pt transferred from triage, presents with SI, plan to strangle or hang himself, shoot himself with a gun.  Denies HI.  Pt reports hearing voices, stating he is not safe.. REports having nightmares. Admits to using bathsalts in the past.  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  States clean for past 10 mos.  Pt calm & cooperative.

## 2012-09-14 NOTE — ED Provider Notes (Signed)
Medical screening examination/treatment/procedure(s) were performed by non-physician practitioner and as supervising physician I was immediately available for consultation/collaboration.  Gregorey Nabor, MD 09/14/12 0743 

## 2012-12-18 ENCOUNTER — Encounter: Payer: Self-pay | Admitting: Neurology

## 2012-12-18 ENCOUNTER — Ambulatory Visit (INDEPENDENT_AMBULATORY_CARE_PROVIDER_SITE_OTHER): Payer: Medicaid Other | Admitting: Neurology

## 2012-12-18 VITALS — BP 122/80 | HR 72 | Ht 72.0 in | Wt 255.0 lb

## 2012-12-18 DIAGNOSIS — R410 Disorientation, unspecified: Secondary | ICD-10-CM | POA: Insufficient documentation

## 2012-12-18 DIAGNOSIS — F29 Unspecified psychosis not due to a substance or known physiological condition: Secondary | ICD-10-CM

## 2012-12-18 DIAGNOSIS — F411 Generalized anxiety disorder: Secondary | ICD-10-CM

## 2012-12-18 NOTE — Progress Notes (Signed)
[facility]  PATIENT: Ricky Wu DOB: 1985/09/09  HISTORICAL Ricky Wu is a 27 years old right-handed Caucasian male, referred by his primary care physician Truddie Coco for evaluation of constellation of complains    He had past medical history of depression, anxiety, also with diagnosis of ADHD, he was treated with Adderall in the summer of 2013, he has a history of substance abuse, including marijuana, street drugs, during that period of time, he went through a lot of stress, with worsening depression anxiety, he reported  tendency to abuse Adderall of escalating doses, up to 100 mg a day, he began to feel strange sensation, worsening anxiety,  Despite being clean from prescription, and street drug use, over the past few months, he continued to feel very depressed, word finding difficulties, difficulty with voice control, anxiety, he worry about the permanent brain damage from previous drug use  He is also evaluated by psychiatrist, is taking trazodone, also Neurontin 800 mg 4 times a day, propranolol 40 mg twice a day, which all failed to control his symptoms,  REVIEW OF SYSTEMS: Full 14 system review of systems performed and notable only for weight gain, fatigue, ringing ears, spinning sensation, blurred vision, double vision, shortness of breath, wheezing, constipation, easy bruising, feeling hot, increased thirst, flushing, joint pain, achy muscles, memory loss, confusion, numbness, weakness, dizziness, tremor, insomnia, sleepiness, restless leg, depression, anxiety, too much sleep, not enough sleep, decreased energy, disinterested in activities, suicidal thoughts, racing thoughts.  ALLERGIES: Allergies  Allergen Reactions  . Other Other (See Comments)    Can not take adderall because of accidental overdose    HOME MEDICATIONS: Outpatient Prescriptions Prior to Visit  Medication Sig Dispense Refill  . niacin 500 MG tablet Take 500 mg by mouth daily with  breakfast.      . traZODone (DESYREL) 25 mg TABS Take 0.5 tablets (25 mg total) by mouth at bedtime as needed for sleep (For insomnia.  May repeat dose once).  30 tablet  0  . venlafaxine (EFFEXOR) 37.5 MG tablet Take 1 tablet (37.5 mg total) by mouth 2 (two) times daily with a meal.  60 tablet  0   No facility-administered medications prior to visit.    PAST MEDICAL HISTORY: Past Medical History  Diagnosis Date  . Depression   . ADHD (attention deficit hyperactivity disorder)   . Anxiety disorder   . Disorganized thought process   . Penis disorder   . Anxiety   . Confusion     PAST SURGICAL HISTORY: Past Surgical History  Procedure Laterality Date  . Hand surgery      FAMILY HISTORY: Family History  Problem Relation Age of Onset  . Hypertension Mother   . Diabetes Mother   . Cancer Father   . Arthritis Father   . Diabetes Other     SOCIAL HISTORY:  History   Social History  . Marital Status: Single    Spouse Name: N/A    Number of Children: N/A  . Years of Education: 13   Occupational History  .      Does Not Work   Social History Main Topics  . Smoking status: Current Every Day Smoker -- 2.00 packs/day for 14 years    Types: Cigarettes  . Smokeless tobacco: Never Used  . Alcohol Use: 0.0 oz/week     Comment: occ  . Drug Use: No     Comment: Quit one year and two months.  . Sexual Activity: Not on  file   Other Topics Concern  . Not on file   Social History Narrative   Patient lives at home home alone sharing with his aunt and uncle. Patient has some college. Patient  is not working at this time. education.   Left handed.   Caffeine- two cokes.     PHYSICAL EXAM   Filed Vitals:   12/18/12 1513  BP: 122/80  Pulse: 72  Height: 6' (1.829 m)  Weight: 255 lb (115.667 kg)    Not recorded    Body mass index is 34.58 kg/(m^2).   Generalized: In no acute distress  Neck: Supple, no carotid bruits   Cardiac: Regular rate  rhythm  Pulmonary: Clear to auscultation bilaterally  Musculoskeletal: No deformity  Neurological examination  Mentation: Alert oriented to time, place, history taking, and causual conversation  Cranial nerve II-XII: Pupils were equal round reactive to light extraocular movements were full, visual field were full on confrontational test. facial sensation and Wu were normal. hearing was intact to finger rubbing bilaterally. Uvula tongue midline.  head turning and shoulder shrug and were normal and symmetric.Tongue protrusion into cheek Wu was normal.  Motor: normal tone, bulk and Wu.  Sensory: Intact to fine touch, pinprick, preserved vibratory sensation, and proprioception at toes.  Coordination: Normal finger to nose, heel-to-shin bilaterally there was no truncal ataxia  Gait: Rising up from seated position without assistance, normal stance, without trunk ataxia, moderate stride, good arm swing, smooth turning, able to perform tiptoe, and heel walking without difficulty.   Romberg signs: Negative  Deep tendon reflexes: Brachioradialis 2/2, biceps 2/2, triceps 2/2, patellar 2/2, Achilles 2/2, plantar responses were flexor bilaterally.   DIAGNOSTIC DATA (LABS, IMAGING, TESTING) - I reviewed patient records, labs, notes, testing and imaging myself where available.  Lab Results  Component Value Date   WBC 11.8* 09/12/2012   HGB 16.6 09/12/2012   HCT 46.3 09/12/2012   MCV 89.0 09/12/2012   PLT 273 09/12/2012      Component Value Date/Time   NA 137 09/12/2012 0630   K 3.9 09/12/2012 0630   CL 100 09/12/2012 0630   CO2 24 09/12/2012 0630   GLUCOSE 116* 09/12/2012 0630   BUN 9 09/12/2012 0630   CREATININE 0.76 09/12/2012 0630   CALCIUM 9.9 09/12/2012 0630   PROT 7.9 08/08/2012 0321   ALBUMIN 4.0 08/08/2012 0321   AST 25 08/08/2012 0321   ALT 38 08/08/2012 0321   ALKPHOS 85 08/08/2012 0321   BILITOT 0.2* 08/08/2012 0321   GFRNONAA >90 09/12/2012 0630   GFRAA >90 09/12/2012  0630   No results found for this basename: CHOL, HDL, LDLCALC, LDLDIRECT, TRIG, CHOLHDL   No results found for this basename: HGBA1C   Lab Results  Component Value Date   VITAMINB12 328 05/17/2010   Lab Results  Component Value Date   TSH 1.214 05/17/2010      ASSESSMENT AND PLAN   27 years old right-handed Caucasian male, with past medical history of mood disorder, now presenting with constellation of complaints, essentially normal neurological examination,  1.  most of his complaints are related to his mood disorder.    2.  I will proceed with MRI of the brain, EEG to complete evaluation. 3.   keep current medication her will s including Neurontin, trazodone, continue his evaluation by psychologist, will call him about result, he will only return to clinic if there is any abnormality found       Levert Feinstein, M.D. Ph.D.  [facility] [address] [place], [place] [address] [phone]

## 2012-12-26 ENCOUNTER — Ambulatory Visit (INDEPENDENT_AMBULATORY_CARE_PROVIDER_SITE_OTHER): Payer: Medicaid Other | Admitting: Radiology

## 2012-12-26 DIAGNOSIS — F29 Unspecified psychosis not due to a substance or known physiological condition: Secondary | ICD-10-CM

## 2012-12-26 DIAGNOSIS — F411 Generalized anxiety disorder: Secondary | ICD-10-CM

## 2012-12-26 DIAGNOSIS — R410 Disorientation, unspecified: Secondary | ICD-10-CM

## 2012-12-30 ENCOUNTER — Inpatient Hospital Stay: Admission: RE | Admit: 2012-12-30 | Payer: Self-pay | Source: Ambulatory Visit

## 2013-01-01 NOTE — Procedures (Signed)
HISTORY:  27 years old male, presenting with word finding difficulties, depression  TECHNIQUE:  16 channel EEG was performed based on standard 10-16 international system. One channel was dedicated to EKG, which has demonstrates normal sinus rhythm of 66 beats per minutes.  Upon awakening, the posterior background activity was well-developed, in alpha range 10 Hz, with amplitude of 45 microvoltage, reactive to eye opening and closure.  There was no evidence of epilepsy for discharge.  Photic stimulation was performed, which induced a symmetric photic driving.  Hyperventilation was performed, there was no abnormality elicit.  No sleep was achieved.  CONCLUSION: This is a  normal awake EEG.  There is no electrodiagnostic evidence of epileptiform discharge

## 2013-01-09 ENCOUNTER — Other Ambulatory Visit: Payer: Self-pay

## 2013-01-16 ENCOUNTER — Telehealth: Payer: Self-pay | Admitting: Neurology

## 2013-01-16 NOTE — Telephone Encounter (Signed)
Patient calling for results of EEG.

## 2013-01-16 NOTE — Telephone Encounter (Signed)
Please call patient for normal EEG 

## 2013-01-17 ENCOUNTER — Inpatient Hospital Stay: Admission: RE | Admit: 2013-01-17 | Payer: Self-pay | Source: Ambulatory Visit

## 2013-01-18 NOTE — Telephone Encounter (Signed)
Shared normal EEG results w/ patient thru VM message

## 2013-01-24 ENCOUNTER — Telehealth: Payer: Self-pay | Admitting: Neurology

## 2013-01-24 NOTE — Telephone Encounter (Signed)
Patient left message that he wanted to go over the normal EEG results.  I spoke to patient and he is concerned about his thoughts not being connected, visual memory, loss of pleasure, he said everything he talked about in his visit with the doctor.  He if afraid that the over use of Adderall may have caused permanent damage, and he wanted to come see the doctor.   He MRI is scheduled for 02-05-13, I told him it would be best to wait until results are in for MRI and then doctor can explain all the results at a follow up appointment.  He was in agreement.

## 2013-01-25 NOTE — Telephone Encounter (Signed)
Called patient and left message for patient and told him to call office back and we would get him scheduled for MRI to keep his apt for MRI and we would schedule follow with Dr.Yan.

## 2013-01-28 ENCOUNTER — Other Ambulatory Visit: Payer: Self-pay

## 2013-01-29 NOTE — Telephone Encounter (Signed)
Noted  

## 2013-02-05 ENCOUNTER — Inpatient Hospital Stay
Admission: RE | Admit: 2013-02-05 | Discharge: 2013-02-05 | Disposition: A | Discharge status: Home / Self Care | Payer: Self-pay | Source: Ambulatory Visit | Attending: Neurology | Admitting: Neurology

## 2013-02-15 ENCOUNTER — Inpatient Hospital Stay: Admission: RE | Admit: 2013-02-15 | Payer: Self-pay | Source: Ambulatory Visit

## 2013-02-27 ENCOUNTER — Ambulatory Visit
Admission: RE | Admit: 2013-02-27 | Discharge: 2013-02-27 | Disposition: A | Discharge status: Home / Self Care | Payer: Medicaid Other | Source: Ambulatory Visit | Attending: Neurology | Admitting: Neurology

## 2013-02-27 DIAGNOSIS — R410 Disorientation, unspecified: Secondary | ICD-10-CM

## 2013-02-27 DIAGNOSIS — F411 Generalized anxiety disorder: Secondary | ICD-10-CM

## 2013-02-27 DIAGNOSIS — F23 Brief psychotic disorder: Secondary | ICD-10-CM

## 2013-03-01 ENCOUNTER — Other Ambulatory Visit: Payer: Self-pay

## 2013-03-11 ENCOUNTER — Telehealth: Payer: Self-pay | Admitting: Neurology

## 2013-03-11 NOTE — Telephone Encounter (Signed)
Patient has further questions about his cognitive issues, even though MRI and EEG were normal.  He is interested in seeing a neurophysiologist, and is trying to find one that accepts medicaid.  He will address his concerns at his follow up visit on 03-13-13.

## 2013-03-13 ENCOUNTER — Telehealth: Payer: Self-pay | Admitting: Neurology

## 2013-03-13 ENCOUNTER — Encounter (INDEPENDENT_AMBULATORY_CARE_PROVIDER_SITE_OTHER): Payer: Self-pay

## 2013-03-13 ENCOUNTER — Encounter: Payer: Self-pay | Admitting: Neurology

## 2013-03-13 ENCOUNTER — Ambulatory Visit (INDEPENDENT_AMBULATORY_CARE_PROVIDER_SITE_OTHER): Payer: Medicaid Other | Admitting: Neurology

## 2013-03-13 VITALS — BP 131/91 | HR 67 | Ht 73.5 in | Wt 253.0 lb

## 2013-03-13 DIAGNOSIS — R410 Disorientation, unspecified: Secondary | ICD-10-CM

## 2013-03-13 DIAGNOSIS — F411 Generalized anxiety disorder: Secondary | ICD-10-CM

## 2013-03-13 DIAGNOSIS — R413 Other amnesia: Secondary | ICD-10-CM

## 2013-03-13 NOTE — Telephone Encounter (Signed)
Please advise 

## 2013-03-13 NOTE — Progress Notes (Signed)
[facility]  PATIENT: Ricky Wu DOB: 1985/12/22  HISTORICAL [name] is a 27 years old right-handed Caucasian male, referred by his primary care physician Ricky Wu for evaluation of constellation of complains    He had past medical history of depression, anxiety, also with diagnosis of ADHD, he was treated with Adderall in the summer of 2013, he has a history of substance abuse, including marijuana, street drugs, during that period of time, he went through a lot of stress, with worsening depression anxiety, he reported  tendency to abuse Adderall of escalating doses, up to 100 mg a day, he began to feel strange sensation, worsening anxiety,  Despite being clean from prescription, and street drug use, over the past few months, he continued to feel very depressed, word finding difficulties, difficulty with voice control, anxiety, he worry about the permanent brain damage from previous drug use  He is also evaluated by psychiatrist, is taking trazodone, also Neurontin 800 mg 4 times a day, propranolol 40 mg twice a day, which all failed to control his symptoms,  UPDATE 03/13/2013: " I am not doing good", he felt restless, lost pleasure in doing anything. He is seeing psychiatrist Ricky Wu, Ricky Wu.   He is not working, he lives in a building behind his uncle's house, he has food stamp,  He gets more restless close to others, he has to have TV, computer on, covered himself in blanket.  He also complains of confusion, memory loss. He has jail time coming up, sentence for 3 years.  He reported response to clonazepam 1mg  bid in the past, wants more prescription of clonazepam.   REVIEW OF SYSTEMS: Full 14 system review of systems performed and notable only for weight gain, fatigue, ringing ears, spinning sensation, blurred vision, double vision, shortness of breath, wheezing, constipation, easy bruising, feeling hot, increased thirst, flushing, joint pain, achy  muscles, memory loss, confusion, numbness, weakness, dizziness, tremor, insomnia, sleepiness, restless leg, depression, anxiety, too much sleep, not enough sleep, decreased energy, disinterested in activities, suicidal thoughts, racing thoughts.  ALLERGIES: Allergies  Allergen Reactions  . Other Other (See Comments)    Can not take adderall because of accidental overdose    HOME MEDICATIONS: Outpatient Prescriptions Prior to Visit  Medication Sig Dispense Refill  . gabapentin (NEURONTIN) 800 MG tablet Take 800 mg by mouth 4 (four) times Wu.       . propranolol (INDERAL) 20 MG tablet Take 40 mg by mouth 3 (three) times Wu.       . niacin 500 MG tablet Take 500 mg by mouth Wu with breakfast.      . traZODone (DESYREL) 25 mg TABS Take 0.5 tablets (25 mg total) by mouth at bedtime as needed for sleep (For insomnia.  May repeat dose once).  30 tablet  0   No facility-administered medications prior to visit.    PAST MEDICAL HISTORY: Past Medical History  Diagnosis Date  . Depression   . ADHD (attention deficit hyperactivity disorder)   . Anxiety disorder   . Disorganized thought process   . Penis disorder   . Anxiety   . Confusion     PAST SURGICAL HISTORY: Past Surgical History  Procedure Laterality Date  . Hand surgery      FAMILY HISTORY: Family History  Problem Relation Age of Onset  . Hypertension Mother   . Diabetes Mother   . Cancer Father   . Arthritis Father   . Diabetes Other  SOCIAL HISTORY:  History   Social History  . Marital Status: Single    Spouse Name: N/A    Number of Children: 0  . Years of Education: 13   Occupational History  .      Does Not Work   Social History Main Topics  . Smoking status: Current Every Day Smoker -- 2.00 packs/day for 14 years    Types: Cigarettes  . Smokeless tobacco: Never Used  . Alcohol Use: No     Comment: occ  . Drug Use: No     Comment: Quit one year and two months.  . Sexual Activity: Not on  file   Other Topics Concern  . Not on file   Social History Narrative   Patient lives at home home alone sharing with his aunt and uncle. Patient has some college. Patient  is not working at this time. education.   Left handed.   Caffeine- two cokes.     PHYSICAL EXAM   Filed Vitals:   03/13/13 1126  BP: 131/91  Pulse: 67  Height: 6' 1.5" (1.867 m)  Weight: 253 lb (114.76 kg)    Not recorded    Body mass index is 32.92 kg/(m^2).   Generalized: In no acute distress  Neck: Supple, no carotid bruits   Cardiac: Regular rate rhythm  Pulmonary: Clear to auscultation bilaterally  Musculoskeletal: No deformity  Neurological examination  Mentation: Alert oriented to time, place, history taking, and causual conversation  Cranial nerve II-XII: Pupils were equal round reactive to light extraocular movements were full, visual field were full on confrontational test. facial sensation and strength were normal. hearing was intact to finger rubbing bilaterally. Uvula tongue midline.  head turning and shoulder shrug and were normal and symmetric.Tongue protrusion into cheek strength was normal.  Motor: normal tone, bulk and strength.  Sensory: Intact to fine touch, pinprick, preserved vibratory sensation, and proprioception at toes.  Coordination: Normal finger to nose, heel-to-shin bilaterally there was no truncal ataxia  Gait: Rising up from seated position without assistance, normal stance, without trunk ataxia, moderate stride, good arm swing, smooth turning, able to perform tiptoe, and heel walking without difficulty.   Romberg signs: Negative  Deep tendon reflexes: Brachioradialis 2/2, biceps 2/2, triceps 2/2, patellar 2/2, Achilles 2/2, plantar responses were flexor bilaterally.   DIAGNOSTIC DATA (LABS, IMAGING, TESTING) - I reviewed patient records, labs, notes, testing and imaging myself where available.  Lab Results  Component Value Date   WBC 11.8* 09/12/2012    HGB 16.6 09/12/2012   HCT 46.3 09/12/2012   MCV 89.0 09/12/2012   PLT 273 09/12/2012      Component Value Date/Time   NA 137 09/12/2012 0630   K 3.9 09/12/2012 0630   CL 100 09/12/2012 0630   CO2 24 09/12/2012 0630   GLUCOSE 116* 09/12/2012 0630   BUN 9 09/12/2012 0630   CREATININE 0.76 09/12/2012 0630   CALCIUM 9.9 09/12/2012 0630   PROT 7.9 08/08/2012 0321   ALBUMIN 4.0 08/08/2012 0321   AST 25 08/08/2012 0321   ALT 38 08/08/2012 0321   ALKPHOS 85 08/08/2012 0321   BILITOT 0.2* 08/08/2012 0321   GFRNONAA >90 09/12/2012 0630   GFRAA >90 09/12/2012 0630   No results found for this basename: CHOL,  HDL,  LDLCALC,  LDLDIRECT,  TRIG,  CHOLHDL   No results found for this basename: HGBA1C   Lab Results  Component Value Date   VITAMINB12 328 05/17/2010   Lab Results  Component  Value Date   TSH 1.214 05/17/2010      ASSESSMENT AND PLAN   27 years old right-handed Caucasian male, with past medical history of mood disorder, now presenting with constellation of complaints, essentially normal neurological examination, his mood disorder likely play a major role in his constellation of complaints  1. I will refer him for neurosychology evaluation   2. RTC as needed.  3. He asks for clonazepam for anxiety, pending jail time   Levert Feinstein, M.D. Ph.D.  [facility] [address] [place], [place] [address] [phone]

## 2013-03-14 DIAGNOSIS — R413 Other amnesia: Secondary | ICD-10-CM | POA: Insufficient documentation

## 2013-03-14 NOTE — Telephone Encounter (Signed)
Chart, reviewed, asking for benzodiazepines  during last clinical visit, I do not think he needs sleep study, I will refer him to neuropsychology evaluation for his complaints of memory trouble confusion,,

## 2013-03-18 ENCOUNTER — Telehealth: Payer: Self-pay | Admitting: Neurology

## 2013-03-18 NOTE — Telephone Encounter (Signed)
I called patient. He wanted to know if a referral was going to be made for a sleep study. I let him know that I see Dr. Terrace Arabia made a referral for a neuro-psyc eval. She would want him to go to that and see what the neuro-psychologist found first. His symptoms may be from stress and anxiety. Patient concerned whether or not he will be approved through [place]. I see he has been authorized. I gave patient the psychologists pone number and asked him to call to schedule appointment. I asked patient to let me know how that goes and we can discuss with Dr. Terrace Arabia a sleep study after patient follows recommendations of psychologist.

## 2013-03-22 ENCOUNTER — Emergency Department (HOSPITAL_COMMUNITY)
Admission: EM | Admit: 2013-03-22 | Discharge: 2013-03-22 | Disposition: A | Discharge status: Home / Self Care | Payer: Medicaid Other | Attending: Emergency Medicine | Admitting: Emergency Medicine

## 2013-03-22 ENCOUNTER — Encounter (HOSPITAL_COMMUNITY): Payer: Self-pay | Admitting: Emergency Medicine

## 2013-03-22 DIAGNOSIS — F3289 Other specified depressive episodes: Secondary | ICD-10-CM | POA: Insufficient documentation

## 2013-03-22 DIAGNOSIS — F17200 Nicotine dependence, unspecified, uncomplicated: Secondary | ICD-10-CM | POA: Insufficient documentation

## 2013-03-22 DIAGNOSIS — Z87448 Personal history of other diseases of urinary system: Secondary | ICD-10-CM | POA: Insufficient documentation

## 2013-03-22 DIAGNOSIS — G479 Sleep disorder, unspecified: Secondary | ICD-10-CM | POA: Insufficient documentation

## 2013-03-22 DIAGNOSIS — F410 Panic disorder [episodic paroxysmal anxiety] without agoraphobia: Secondary | ICD-10-CM | POA: Insufficient documentation

## 2013-03-22 DIAGNOSIS — Z79899 Other long term (current) drug therapy: Secondary | ICD-10-CM | POA: Insufficient documentation

## 2013-03-22 DIAGNOSIS — F419 Anxiety disorder, unspecified: Secondary | ICD-10-CM

## 2013-03-22 DIAGNOSIS — F329 Major depressive disorder, single episode, unspecified: Secondary | ICD-10-CM | POA: Insufficient documentation

## 2013-03-22 MED ORDER — LORAZEPAM 1 MG PO TABS
1.0000 mg | ORAL_TABLET | Freq: Once | ORAL | Status: AC
  Administered 2013-03-22: 1 mg via ORAL
  Filled 2013-03-22: qty 1

## 2013-03-22 NOTE — ED Notes (Signed)
Called for room assignment, no answer °

## 2013-03-22 NOTE — ED Provider Notes (Signed)
CSN: [id number]     Arrival date & time 03/22/13  1135 History   First MD Initiated Contact with Patient 03/22/13 1317     Chief Complaint  Patient presents with  . Anxiety   (Consider location/radiation/quality/duration/timing/severity/associated sxs/prior Treatment) HPI Comments: 27 year old male with a past medical history of anxiety who presents to the emergency department complaining of increased anxiety and panic attacks beginning this morning. Patient states he has jail time coming up and is nervous for this, states he is very fidgety and restless, cannot sleep. He is afraid that this may happen in jail. States these facts are that he was started on about one month ago is not helping. He was given a prescription for Klonopin at that time as well, states he took that for 10 days straight and it is the only thing that helps him. States that he called the jail and was told he could not receive any medication similar to Klonopin in jail unless he enters with a prescription. States his psychiatrist at [facility] has left her practice and he has not been able to see her. Denies suicidal or homicidal ideations. He is going to jail for a DUI for about 30 days. Continues to state that he does not have any addictive tendencies and is not drug seeking.  Patient is a 27 y.o. male presenting with anxiety. The history is provided by the patient.  Anxiety    Past Medical History  Diagnosis Date  . Depression   . ADHD (attention deficit hyperactivity disorder)   . Anxiety disorder   . Disorganized thought process   . Penis disorder   . Anxiety   . Confusion    Past Surgical History  Procedure Laterality Date  . Hand surgery     Family History  Problem Relation Age of Onset  . Hypertension Mother   . Diabetes Mother   . Cancer Father   . Arthritis Father   . Diabetes Other    History  Substance Use Topics  . Smoking status: Current Every Day Smoker -- 2.00 packs/day for 14 years    Types:  Cigarettes  . Smokeless tobacco: Never Used  . Alcohol Use: No     Comment: occ    Review of Systems  Psychiatric/Behavioral: Positive for sleep disturbance. The patient is nervous/anxious.   All other systems reviewed and are negative.    Allergies  Other  Home Medications   Current Outpatient Rx  Name  Route  Sig  Dispense  Refill  . gabapentin (NEURONTIN) 800 MG tablet   Oral   Take 800 mg by mouth 4 (four) times daily.          . propranolol (INDERAL) 20 MG tablet   Oral   Take 20 mg by mouth as needed (agitation/restlessness).          . venlafaxine (EFFEXOR) 37.5 MG tablet   Oral   Take 37.5 mg by mouth daily.          BP 142/96  Pulse 73  Temp(Src) 98 F (36.7 C) (Oral)  Resp 20  Wt 260 lb (117.935 kg)  SpO2 100% Physical Exam  Nursing note and vitals reviewed. Constitutional: He is oriented to person, place, and time. He appears well-developed and well-nourished. No distress.  HENT:  Head: Normocephalic and atraumatic.  Mouth/Throat: Oropharynx is clear and moist.  Eyes: Conjunctivae are normal.  Neck: Normal range of motion. Neck supple.  Cardiovascular: Normal rate, regular rhythm and normal heart sounds.  Pulmonary/Chest: Effort normal and breath sounds normal.  Abdominal: Soft. Bowel sounds are normal. There is no tenderness.  Musculoskeletal: Normal range of motion. He exhibits no edema.  Neurological: He is alert and oriented to person, place, and time.  Skin: Skin is warm and dry. He is not diaphoretic.  Psychiatric: His behavior is normal. His mood appears anxious. He expresses no homicidal and no suicidal ideation.    ED Course  Procedures (including critical care time) Labs Review Labs Reviewed - No data to display Imaging Review No results found.  EKG Interpretation   None       MDM   1. Anxiety    Patient requesting Klonopin prescription before he goes to jail. I discussed that this is a medication prescribed by  psychiatrist or PCP, not refilled by the ED. He has both PCP and psychiatrist. I treated his anxiety today with Ativan. Patient argumentative about not receiving Klonopin prescription. No suicidal homicidal ideations. Stable for discharge.    Trevor Mace, PA-C 03/22/13 1416

## 2013-03-22 NOTE — ED Notes (Signed)
Pt in c/o anxiety attack this am, states that he has jail time coming up and is having a lot of anxiety related to that, states his home medication for anxiety is not working, states that [employer] works for him but he doesn't have a prescription right now.

## 2013-03-28 NOTE — ED Provider Notes (Signed)
Medical screening examination/treatment/procedure(s) were performed by non-physician practitioner and as supervising physician I was immediately available for consultation/collaboration.  EKG Interpretation   None         Richardean Canalavid H Kalanie Fewell, MD 03/28/13 [phone]

## 2013-04-01 DIAGNOSIS — F418 Other specified anxiety disorders: Secondary | ICD-10-CM | POA: Insufficient documentation

## 2013-07-14 ENCOUNTER — Encounter (HOSPITAL_BASED_OUTPATIENT_CLINIC_OR_DEPARTMENT_OTHER): Payer: Self-pay | Admitting: Emergency Medicine

## 2013-07-14 ENCOUNTER — Emergency Department (HOSPITAL_BASED_OUTPATIENT_CLINIC_OR_DEPARTMENT_OTHER)
Admission: EM | Admit: 2013-07-14 | Discharge: 2013-07-14 | Disposition: A | Discharge status: Home / Self Care | Payer: Medicaid Other | Attending: Emergency Medicine | Admitting: Emergency Medicine

## 2013-07-14 ENCOUNTER — Emergency Department (HOSPITAL_BASED_OUTPATIENT_CLINIC_OR_DEPARTMENT_OTHER): Payer: Medicaid Other

## 2013-07-14 DIAGNOSIS — F329 Major depressive disorder, single episode, unspecified: Secondary | ICD-10-CM | POA: Insufficient documentation

## 2013-07-14 DIAGNOSIS — S0181XA Laceration without foreign body of other part of head, initial encounter: Secondary | ICD-10-CM

## 2013-07-14 DIAGNOSIS — Z79899 Other long term (current) drug therapy: Secondary | ICD-10-CM | POA: Insufficient documentation

## 2013-07-14 DIAGNOSIS — F17200 Nicotine dependence, unspecified, uncomplicated: Secondary | ICD-10-CM | POA: Insufficient documentation

## 2013-07-14 DIAGNOSIS — S0180XA Unspecified open wound of other part of head, initial encounter: Secondary | ICD-10-CM | POA: Insufficient documentation

## 2013-07-14 DIAGNOSIS — F3289 Other specified depressive episodes: Secondary | ICD-10-CM | POA: Insufficient documentation

## 2013-07-14 DIAGNOSIS — F411 Generalized anxiety disorder: Secondary | ICD-10-CM | POA: Insufficient documentation

## 2013-07-14 DIAGNOSIS — Z87448 Personal history of other diseases of urinary system: Secondary | ICD-10-CM | POA: Insufficient documentation
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
## 2013-07-14 NOTE — Discharge Instructions (Signed)
Facial Laceration °A facial laceration is a cut on the face. These injuries can be painful and cause bleeding. Some cuts may need to be closed with stitches (sutures), skin adhesive strips, or wound glue. Cuts usually heal quickly but can leave a scar. It can take 1 2 years for the scar to go away completely. °HOME CARE  °· Only take medicines as told by your doctor. °· Follow your doctor's instructions for wound care. °For Stitches: °· Keep the cut clean and dry. °· If you have a bandage (dressing), change it at least once a day. Change the bandage if it gets wet or dirty, or as told by your doctor. °· Wash the cut with soap and water 2 times a day. Rinse the cut with water. Pat it dry with a clean towel. °· Put a thin layer of medicated cream on the cut as told by your doctor. °· You may shower after the first 24 hours. Do not soak the cut in water until the stitches are removed. °· Have your stitches removed as told by your doctor. °· Do not wear any makeup until a few days after your stitches are removed. °For Skin Adhesive Strips: °· Keep the cut clean and dry. °· Do not get the strips wet. You may take a bath, but be careful to keep the cut dry. °· If the cut gets wet, pat it dry with a clean towel. °· The strips will fall off on their own. Do not remove the strips that are still stuck to the cut. °For Wound Glue: °· You may shower or take baths. Do not soak or scrub the cut. Do not swim. Avoid heavy sweating until the glue falls off on its own. After a shower or bath, pat the cut dry with a clean towel. °· Do not put medicine or makeup on your cut until the glue falls off. °· If you have a bandage, do not put tape over the glue. °· Avoid lots of sunlight or tanning lamps until the glue falls off. °· The glue will fall off on its own in 5 10 days. Do not pick at the glue. °After Healing: °Put sunscreen on the cut for the first year to reduce your scar. °GET HELP RIGHT AWAY IF:  °· Your cut area gets red,  painful, or puffy (swollen). °· You see a yellowish-white fluid (pus) coming from the cut. °· You have chills or a fever. °MAKE SURE YOU:  °· Understand these instructions. °· Will watch your condition. °· Will get help right away if you are not doing well or get worse. °Document Released: 08/31/2007 Document Revised: 01/02/2013 Document Reviewed: 10/25/2012 °ExitCare® Patient Information ©2014 [employer]. ° °

## 2013-07-14 NOTE — ED Notes (Signed)
I completed wound care, I applied bacitracin to suture site, cleaned all dried blood on face, then applied 2x2's and secured with head wrap of kerlix, then tape. I gave patient additional supplies to continue care at home.

## 2013-07-14 NOTE — ED Notes (Signed)
I irrigated patient laceration with saline and "safety cleans" under pressure from i.v.tubing and syringe. I cleaned dried blood from around eye, then softly wiped into wound with same solution, repeated irrigation.
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
## 2013-07-14 NOTE — ED Notes (Signed)
Patient asked for something to eat at triage, I told him Doctor must see him before he is given any food or drink.

## 2013-07-22 ENCOUNTER — Emergency Department (HOSPITAL_COMMUNITY)
Admission: EM | Admit: 2013-07-22 | Discharge: 2013-07-22 | Disposition: A | Discharge status: Home / Self Care | Payer: Medicaid Other | Attending: Emergency Medicine | Admitting: Emergency Medicine

## 2013-07-22 ENCOUNTER — Encounter (HOSPITAL_COMMUNITY): Payer: Self-pay | Admitting: Emergency Medicine

## 2013-07-22 DIAGNOSIS — Z87448 Personal history of other diseases of urinary system: Secondary | ICD-10-CM | POA: Insufficient documentation

## 2013-07-22 DIAGNOSIS — Z79899 Other long term (current) drug therapy: Secondary | ICD-10-CM | POA: Insufficient documentation

## 2013-07-22 DIAGNOSIS — F3289 Other specified depressive episodes: Secondary | ICD-10-CM | POA: Insufficient documentation

## 2013-07-22 DIAGNOSIS — Z4802 Encounter for removal of sutures: Secondary | ICD-10-CM | POA: Insufficient documentation

## 2013-07-22 DIAGNOSIS — F17200 Nicotine dependence, unspecified, uncomplicated: Secondary | ICD-10-CM | POA: Insufficient documentation

## 2013-07-22 DIAGNOSIS — F411 Generalized anxiety disorder: Secondary | ICD-10-CM | POA: Insufficient documentation

## 2013-07-22 DIAGNOSIS — F329 Major depressive disorder, single episode, unspecified: Secondary | ICD-10-CM | POA: Insufficient documentation

## 2013-07-22 NOTE — Discharge Instructions (Signed)
Suture Removal, Care After Refer to this sheet in the next few weeks. These instructions provide you with information on caring for yourself after your procedure. Your health care provider may also give you more specific instructions. Your treatment has been planned according to current medical practices, but problems sometimes occur. Call your health care provider if you have any problems or questions after your procedure. WHAT TO EXPECT AFTER THE PROCEDURE After your stitches (sutures) are removed, it is typical to have the following:  Some discomfort and swelling in the wound area.  Slight redness in the area. HOME CARE INSTRUCTIONS   If you have skin adhesive strips over the wound area, do not take the strips off. They will fall off on their own in a few days. If the strips remain in place after 14 days, you may remove them.  Change any bandages (dressings) at least once a day or as directed by your health care provider. If the bandage sticks, soak it off with warm, soapy water.  Apply cream or ointment only as directed by your health care provider. If using cream or ointment, wash the area with soap and water 2 times a day to remove all the cream or ointment. Rinse off the soap and pat the area dry with a clean towel.  Keep the wound area dry and clean. If the bandage becomes wet or dirty, or if it develops a bad smell, change it as soon as possible.  Continue to protect the wound from injury.  Use sunscreen when out in the sun. New scars become sunburned easily. SEEK MEDICAL CARE IF:  You have increasing redness, swelling, or pain in the wound.  You see pus coming from the wound.  You have a fever.  You notice a bad smell coming from the wound or dressing.  Your wound breaks open (edges not staying together). Document Released: 12/07/2000 Document Revised: 01/02/2013 Document Reviewed: 10/24/2012 ExitCare Patient Information 2014 [employer].  

## 2013-07-22 NOTE — ED Notes (Signed)
Patient has sutures that were [facility] ago [facility] and is here today to have them removed.
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
## 2013-07-23 NOTE — ED Provider Notes (Signed)
Medical screening examination/treatment/procedure(s) were performed by non-physician practitioner and as supervising physician I was immediately available for consultation/collaboration.   EKG Interpretation None        Layla MawKristen N Munachimso Palin, DO 07/23/13 1503

## 2013-09-26 ENCOUNTER — Emergency Department (HOSPITAL_COMMUNITY): Payer: Medicaid Other

## 2013-09-26 ENCOUNTER — Emergency Department (HOSPITAL_COMMUNITY)
Admission: EM | Admit: 2013-09-26 | Discharge: 2013-09-26 | Disposition: A | Discharge status: Home / Self Care | Payer: Medicaid Other | Attending: Emergency Medicine | Admitting: Emergency Medicine

## 2013-09-26 ENCOUNTER — Encounter (HOSPITAL_COMMUNITY): Payer: Self-pay | Admitting: Emergency Medicine

## 2013-09-26 DIAGNOSIS — R0602 Shortness of breath: Secondary | ICD-10-CM | POA: Diagnosis not present

## 2013-09-26 DIAGNOSIS — Y9389 Activity, other specified: Secondary | ICD-10-CM | POA: Insufficient documentation

## 2013-09-26 DIAGNOSIS — F411 Generalized anxiety disorder: Secondary | ICD-10-CM | POA: Insufficient documentation

## 2013-09-26 DIAGNOSIS — F3289 Other specified depressive episodes: Secondary | ICD-10-CM | POA: Insufficient documentation

## 2013-09-26 DIAGNOSIS — Z79899 Other long term (current) drug therapy: Secondary | ICD-10-CM | POA: Insufficient documentation

## 2013-09-26 DIAGNOSIS — IMO0002 Reserved for concepts with insufficient information to code with codable children: Secondary | ICD-10-CM | POA: Diagnosis not present

## 2013-09-26 DIAGNOSIS — S2239XA Fracture of one rib, unspecified side, initial encounter for closed fracture: Secondary | ICD-10-CM | POA: Diagnosis not present

## 2013-09-26 DIAGNOSIS — Z7982 Long term (current) use of aspirin: Secondary | ICD-10-CM | POA: Insufficient documentation

## 2013-09-26 DIAGNOSIS — S298XXA Other specified injuries of thorax, initial encounter: Secondary | ICD-10-CM | POA: Diagnosis present

## 2013-09-26 DIAGNOSIS — Y929 Unspecified place or not applicable: Secondary | ICD-10-CM | POA: Insufficient documentation

## 2013-09-26 DIAGNOSIS — F329 Major depressive disorder, single episode, unspecified: Secondary | ICD-10-CM | POA: Insufficient documentation

## 2013-09-26 DIAGNOSIS — S2231XA Fracture of one rib, right side, initial encounter for closed fracture: Secondary | ICD-10-CM

## 2013-09-26 DIAGNOSIS — F17200 Nicotine dependence, unspecified, uncomplicated: Secondary | ICD-10-CM | POA: Diagnosis not present

## 2013-09-26 MED ORDER — OXYCODONE-ACETAMINOPHEN 5-325 MG PO TABS: 1.0000 | ORAL_TABLET | Freq: Four times a day (QID) | ORAL | Status: DC | PRN
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
## 2013-09-26 NOTE — ED Notes (Signed)
Insent/ Cleda DaubSpiro -given to pt. Instructions and return demo completed
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
## 2013-09-26 NOTE — Discharge Instructions (Signed)
Take pain medication as prescribed.  Do not drive or operate heavy machinery for 4 hours after taking pain medication. °

## 2013-09-27 NOTE — ED Provider Notes (Signed)
Medical screening examination/treatment/procedure(s) were performed by non-physician practitioner and as supervising physician I was immediately available for consultation/collaboration.   EKG Interpretation None       Antoninette Lerner R. Rontae Inglett, MD 09/27/13 0024 

## 2013-10-17 ENCOUNTER — Encounter (HOSPITAL_COMMUNITY): Payer: Self-pay | Admitting: Emergency Medicine

## 2013-10-17 ENCOUNTER — Emergency Department (HOSPITAL_COMMUNITY)
Admission: EM | Admit: 2013-10-17 | Discharge: 2013-10-17 | Disposition: A | Discharge status: Home / Self Care | Payer: Medicaid Other | Attending: Emergency Medicine | Admitting: Emergency Medicine

## 2013-10-17 ENCOUNTER — Emergency Department (HOSPITAL_COMMUNITY): Payer: Medicaid Other

## 2013-10-17 DIAGNOSIS — F329 Major depressive disorder, single episode, unspecified: Secondary | ICD-10-CM | POA: Diagnosis not present

## 2013-10-17 DIAGNOSIS — F17200 Nicotine dependence, unspecified, uncomplicated: Secondary | ICD-10-CM | POA: Insufficient documentation

## 2013-10-17 DIAGNOSIS — K029 Dental caries, unspecified: Secondary | ICD-10-CM | POA: Diagnosis not present

## 2013-10-17 DIAGNOSIS — G8911 Acute pain due to trauma: Secondary | ICD-10-CM | POA: Diagnosis not present

## 2013-10-17 DIAGNOSIS — Z7982 Long term (current) use of aspirin: Secondary | ICD-10-CM | POA: Insufficient documentation

## 2013-10-17 DIAGNOSIS — Z79899 Other long term (current) drug therapy: Secondary | ICD-10-CM | POA: Diagnosis not present

## 2013-10-17 DIAGNOSIS — R079 Chest pain, unspecified: Secondary | ICD-10-CM | POA: Insufficient documentation

## 2013-10-17 DIAGNOSIS — F3289 Other specified depressive episodes: Secondary | ICD-10-CM | POA: Insufficient documentation

## 2013-10-17 DIAGNOSIS — K089 Disorder of teeth and supporting structures, unspecified: Secondary | ICD-10-CM | POA: Insufficient documentation

## 2013-10-17 DIAGNOSIS — Z87448 Personal history of other diseases of urinary system: Secondary | ICD-10-CM | POA: Diagnosis not present

## 2013-10-17 DIAGNOSIS — F411 Generalized anxiety disorder: Secondary | ICD-10-CM | POA: Insufficient documentation

## 2013-10-17 DIAGNOSIS — K006 Disturbances in tooth eruption: Secondary | ICD-10-CM | POA: Insufficient documentation

## 2013-10-17 DIAGNOSIS — K0889 Other specified disorders of teeth and supporting structures: Secondary | ICD-10-CM

## 2013-10-17 DIAGNOSIS — S20211D Contusion of right front wall of thorax, subsequent encounter: Secondary | ICD-10-CM

## 2013-10-17 MED ORDER — IBUPROFEN 600 MG PO TABS: 600.0000 mg | ORAL_TABLET | Freq: Four times a day (QID) | ORAL | Status: DC | PRN

## 2013-10-17 MED ORDER — TRAMADOL HCL 50 MG PO TABS: 50.0000 mg | ORAL_TABLET | Freq: Four times a day (QID) | ORAL | Status: DC | PRN

## 2013-10-17 MED ORDER — PENICILLIN V POTASSIUM 500 MG PO TABS: 500.0000 mg | ORAL_TABLET | Freq: Three times a day (TID) | ORAL | Status: DC

## 2013-10-17 NOTE — ED Provider Notes (Signed)
Medical screening examination/treatment/procedure(s) were performed by non-physician practitioner and as supervising physician I was immediately available for consultation/collaboration.  Gurinder Toral T Amiayah Giebel, MD 10/17/13 2137 
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
## 2013-10-17 NOTE — Discharge Instructions (Signed)
Please read and follow all provided instructions.  Your diagnoses today include:  1. Rib contusion, right, subsequent encounter   2. Pain, dental     The exam and treatment you received today has been provided on an emergency basis only. This is not a substitute for complete medical or dental care.  Tests performed today include:  Vital signs. See below for your results today.   Chest x-ray - no broken bones, normal appearing lungs.   Medications prescribed:   Vicodin (hydrocodone/acetaminophen) - narcotic pain medication  DO NOT drive or perform any activities that require you to be awake and alert because this medicine can make you drowsy. BE VERY CAREFUL not to take multiple medicines containing Tylenol (also called acetaminophen). Doing so can lead to an overdose which can damage your liver and cause liver failure and possibly death.   Ibuprofen (Motrin, Advil) - anti-inflammatory pain medication  Do not exceed 600mg  ibuprofen every 6 hours, take with food  You have been prescribed an anti-inflammatory medication or NSAID. Take with food. Take smallest effective dose for the shortest duration needed for your pain. Stop taking if you experience stomach pain or vomiting.    Penicillin - antibiotic  You have been prescribed an antibiotic medicine: take the entire course of medicine even if you are feeling better. Stopping early can cause the antibiotic not to work.  Take any prescribed medications only as directed.  Home care instructions:  Follow any educational materials contained in this packet.  Follow-up instructions: Please follow-up with your dentist for further evaluation of your symptoms.   Dental Assistance: See below for dental referrals  Return instructions:   Please return to the Emergency Department if you experience worsening symptoms.  Please return if you develop a fever, you develop more swelling in your face or neck, you have trouble breathing or  swallowing food.  Please return if you have any other emergent concerns.  Additional Information:  Your vital signs today were: BP 136/86   Pulse 69   Temp(Src) 98.3 F (36.8 C) (Oral)   Resp 16   SpO2 97% If your blood pressure (BP) was elevated above 135/85 this visit, please have this repeated by your doctor within one month. -------------- Dental Care: Organization         Address  Phone  Notes  [facility] [address], [place] [phone] Accepts children up to age 221 who are enrolled in [place] or [place] Health Choice; pregnant women with a Medicaid card; and children who have applied for Medicaid or [place] Health Choice, but were declined, whose parents can pay a reduced fee at time of service.  [facility] [place]  [address], [place] [phone] Accepts children up to age 28 who are enrolled in [place] or [place] Health Choice; pregnant women with a Medicaid card; and children who have applied for Medicaid or [place] Health Choice, but were declined, whose parents can pay a reduced fee at time of service.  [facility]  [address], [place] [phone] Patients are seen by appointment only. Walk-ins are not accepted. [facility] will see patients 28 years of age and older. Monday - Tuesday (8am-5pm) Most Wednesdays (8:30-5pm) $30 per visit, cash only  [facility]  [address], [place] [phone] Patients are seen by appointment only. Walk-ins are not accepted. [facility] will see patients 64 years of age and older. One Wednesday Evening (Monthly: Volunteer Based).  $30 per visit, cash only  [employer]  [phone] for adults; Children under age 34, call Graduate Pediatric Dentistry at [phone]. Children aged 49-14, please call [phone] to request a pediatric  application.  Dental services are provided in all areas of dental care including fillings, crowns and bridges, complete and partial dentures, implants, gum treatment, root canals, and extractions. Preventive care is also provided. Treatment is provided to both adults and children. Patients are selected via a lottery and there is often a waiting list.   [facility] [address] [place]  [phone] [email or web address]   [facility] [address] [place], [place] [phone], Ext. [phone] Second and Fourth Thursday of each month, opens at 6:30 AM; Clinic ends at 9 AM.  Patients are seen on a first-come first-served basis, and a limited number are seen during each clinic.   [facility]  [address] [place], [place] [phone]   Eligibility Requirements You must have lived in [place], [place], or [place] counties for at least the last three months.   You cannot be eligible for state or federal sponsored [place], including [employer], [place], or [employer].   You generally cannot be eligible for healthcare insurance through your employer.    How to apply: Eligibility screenings are held every Tuesday and Wednesday afternoon from 1:00 pm until 4:00 pm. You do not need an appointment for the interview!  [facility] [address], [place], [place] [phone]   [facility]  [phone]   [facility] Health Department  [phone]   [facility] Department  [phone]

## 2013-10-31 ENCOUNTER — Ambulatory Visit: Payer: Self-pay | Admitting: Internal Medicine

## 2013-11-08 ENCOUNTER — Ambulatory Visit: Payer: Self-pay | Admitting: Internal Medicine

## 2013-11-22 NOTE — Telephone Encounter (Signed)
Noted  

## 2014-03-22 ENCOUNTER — Emergency Department (HOSPITAL_COMMUNITY)
Admission: EM | Admit: 2014-03-22 | Discharge: 2014-03-23 | Disposition: A | Discharge status: Home / Self Care | Payer: Medicaid Other | Attending: Emergency Medicine | Admitting: Emergency Medicine

## 2014-03-22 ENCOUNTER — Encounter (HOSPITAL_COMMUNITY): Payer: Self-pay | Admitting: Emergency Medicine

## 2014-03-22 DIAGNOSIS — Z79899 Other long term (current) drug therapy: Secondary | ICD-10-CM | POA: Insufficient documentation

## 2014-03-22 DIAGNOSIS — Z7982 Long term (current) use of aspirin: Secondary | ICD-10-CM | POA: Insufficient documentation

## 2014-03-22 DIAGNOSIS — Z720 Tobacco use: Secondary | ICD-10-CM | POA: Insufficient documentation

## 2014-03-22 DIAGNOSIS — Z87438 Personal history of other diseases of male genital organs: Secondary | ICD-10-CM | POA: Insufficient documentation

## 2014-03-22 DIAGNOSIS — F329 Major depressive disorder, single episode, unspecified: Secondary | ICD-10-CM | POA: Insufficient documentation

## 2014-03-22 DIAGNOSIS — F909 Attention-deficit hyperactivity disorder, unspecified type: Secondary | ICD-10-CM | POA: Diagnosis not present

## 2014-03-22 DIAGNOSIS — F419 Anxiety disorder, unspecified: Secondary | ICD-10-CM | POA: Insufficient documentation

## 2014-03-22 DIAGNOSIS — R51 Headache: Secondary | ICD-10-CM | POA: Diagnosis not present

## 2014-03-22 DIAGNOSIS — Z792 Long term (current) use of antibiotics: Secondary | ICD-10-CM | POA: Diagnosis not present

## 2014-03-22 DIAGNOSIS — Z760 Encounter for issue of repeat prescription: Secondary | ICD-10-CM | POA: Insufficient documentation

## 2014-03-22 MED ORDER — GABAPENTIN 400 MG PO CAPS: 800.0000 mg | ORAL_CAPSULE | Freq: Four times a day (QID) | ORAL | Status: DC

## 2014-03-22 MED ORDER — VENLAFAXINE HCL 75 MG PO TABS
225.0000 mg | ORAL_TABLET | Freq: Once | ORAL | Status: DC
  Filled 2014-03-22: qty 3

## 2014-03-22 MED ORDER — VENLAFAXINE HCL ER 75 MG PO CP24
225.0000 mg | ORAL_CAPSULE | Freq: Once | ORAL | Status: AC
  Administered 2014-03-23: 225 mg via ORAL
  Filled 2014-03-22: qty 1

## 2014-03-22 MED ORDER — VENLAFAXINE HCL ER 75 MG PO CP24: 225.0000 mg | ORAL_CAPSULE | Freq: Every day | ORAL | Status: DC

## 2014-03-22 MED ORDER — VENLAFAXINE HCL 75 MG PO TABS: 225.0000 mg | ORAL_TABLET | Freq: Every day | ORAL | Status: DC

## 2014-03-22 MED ORDER — GABAPENTIN 400 MG PO CAPS
800.0000 mg | ORAL_CAPSULE | Freq: Four times a day (QID) | ORAL | Status: DC
  Filled 2014-03-22: qty 16

## 2014-03-22 MED ORDER — GABAPENTIN 400 MG PO CAPS
800.0000 mg | ORAL_CAPSULE | Freq: Once | ORAL | Status: AC
  Administered 2014-03-23: 800 mg via ORAL
  Filled 2014-03-22 (×2): qty 2

## 2014-03-22 NOTE — ED Provider Notes (Signed)
CSN: [id number]     Arrival date & time 03/22/14  2139 History   This chart was scribed for non-physician practitioner Elpidio AnisShari Jessyka Austria, PA-C working with Tomasita CrumbleAdeleke Oni, MD by Evon Slackerrance Branch, ED Scribe. This patient was seen in room WTR8/WTR8 and the patient's care was started at 11:06 PM.    Chief Complaint  Patient presents with  . Medication Refill   The history is provided by the patient. No language interpreter was used.   HPI Comments: Ricky Wu is a 28 y.o. male who presents to the Emergency Department for medication refill. Pt states that he has ran out of his Effexor and Neurontin 2 days prior. Pt states that he is not able to get this medications filled until monday. PT states that he takes the medication for anxiety and anxiety depression. Pt states that he is developing a HA from not taking the medication. Denies fever, nausea, vomiting, SI or HI.   Past Medical History  Diagnosis Date  . Depression   . ADHD (attention deficit hyperactivity disorder)   . Anxiety disorder   . Disorganized thought process   . Penis disorder   . Anxiety   . Confusion    Past Surgical History  Procedure Laterality Date  . Hand surgery     Family History  Problem Relation Age of Onset  . Hypertension Mother   . Diabetes Mother   . Cancer Father   . Arthritis Father   . Diabetes Other    History  Substance Use Topics  . Smoking status: Current Every Day Smoker -- 2.00 packs/day for 14 years    Types: Cigarettes  . Smokeless tobacco: Never Used  . Alcohol Use: No    Review of Systems  Constitutional: Negative for fever.  Gastrointestinal: Negative for nausea and vomiting.  Neurological: Positive for headaches.  Psychiatric/Behavioral: Negative for suicidal ideas.  All other systems reviewed and are negative.   Allergies  Other  Home Medications   Prior to Admission medications   Medication Sig Start Date End Date Taking? Authorizing Provider  aspirin 325 MG tablet Take  650 mg by mouth daily.    Historical Provider, MD  clotrimazole (LOTRIMIN) 1 % cream Apply 1 application topically as needed (athletes foot.).    Historical Provider, MD  gabapentin (NEURONTIN) 800 MG tablet Take 800 mg by mouth 4 (four) times daily.     Historical Provider, MD  hydrOXYzine (ATARAX/VISTARIL) 50 MG tablet Take 100 mg by mouth 3 (three) times daily as needed (anxiety).     Historical Provider, MD  ibuprofen (ADVIL,MOTRIN) 600 MG tablet Take 1 tablet (600 mg total) by mouth every 6 (six) hours as needed. 10/17/13   Renne CriglerJoshua Geiple, PA-C  Misc Natural Products (ENERGY FOCUS) TABS Take 1 tablet by mouth daily.    Historical Provider, MD  penicillin v potassium (VEETID) 500 MG tablet Take 1 tablet (500 mg total) by mouth 3 (three) times daily. 10/17/13   Renne CriglerJoshua Geiple, PA-C  traMADol (ULTRAM) 50 MG tablet Take 1 tablet (50 mg total) by mouth every 6 (six) hours as needed. 10/17/13   Renne CriglerJoshua Geiple, PA-C  venlafaxine (EFFEXOR) 75 MG tablet Take 225 mg by mouth daily.    Historical Provider, MD  white petrolatum (VASELINE) GEL Apply 1 application topically as needed for dry skin.    Historical Provider, MD   Triage Vitals: BP 134/94 mmHg  Pulse 78  Temp(Src) 98 F (36.7 C) (Oral)  Resp 16  Ht 6\' 2"  (1.88 m)  Wt 217 lb (98.431 kg)  BMI 27.85 kg/m2  SpO2 100%  Physical Exam  Constitutional: He is oriented to person, place, and time. He appears well-developed and well-nourished. No distress.  HENT:  Head: Normocephalic and atraumatic.  Eyes: Conjunctivae and EOM are normal.  Neck: Neck supple. No tracheal deviation present.  Cardiovascular: Normal rate.   Pulmonary/Chest: Effort normal. No respiratory distress.  Musculoskeletal: Normal range of motion.  Neurological: He is alert and oriented to person, place, and time.  Skin: Skin is warm and dry.  Psychiatric: He has a normal mood and affect. His behavior is normal.  Nursing note and vitals reviewed.   ED Course  Procedures  (including critical care time) DIAGNOSTIC STUDIES: Oxygen Saturation is 100% on RA, normal by my interpretation.    COORDINATION OF CARE: 11:31 PM-Discussed treatment plan with pt at bedside and pt agreed to plan.     Labs Review Labs Reviewed - No data to display  Imaging Review No results found.   EKG Interpretation None      MDM   Final diagnoses:  None  1. Medication refill 2. Nonspecific headache  He reports similar headache when he missed doses of Effexor and feels certain this headache can be attributed to the missed doses. No neurologic deficits. Pharmacy willing to fill doses for tomorrow. Patient dosed tonight. He reports he will be able to fill his prescriptions on Monday.    I personally performed the services described in this documentation, which was scribed in my presence. The recorded information has been reviewed and is accurate.       Arnoldo HookerShari A Lucinda Spells, PA-C 03/23/14 0003  Tomasita CrumbleAdeleke Oni, MD 03/23/14 [phone]

## 2014-03-22 NOTE — ED Notes (Signed)
No answer x1

## 2014-03-22 NOTE — ED Notes (Signed)
Pt reports that he has ran out of his Effexor and Neurontin for the past 2 days and is unable to have these filled until Monday d/t funds. Pt states that he actually needs the medication, and he has a prescription already.

## 2014-03-23 MED ORDER — VENLAFAXINE HCL ER 75 MG PO CP24
225.0000 mg | ORAL_CAPSULE | Freq: Every day | ORAL | Status: DC
  Filled 2014-03-23: qty 3

## 2014-03-23 NOTE — Discharge Instructions (Signed)
Medication Refill, Emergency Department °We have refilled your medication today as a courtesy to you. It is best for your medical care, however, to take care of getting refills done through your primary caregiver's office. They have your records and can do a better job of follow-up than we can in the emergency department. °On maintenance medications, we often only prescribe enough medications to get you by until you are able to see your regular caregiver. This is a more expensive way to refill medications. °In the future, please plan for refills so that you will not have to use the emergency department for this. °Thank you for your help. Your help allows us to better take care of the daily emergencies that enter our department. °Document Released: 07/01/2003 Document Revised: 06/06/2011 Document Reviewed: 06/21/2013 °ExitCare® Patient Information ©2015 [employer]. This information is not intended to replace advice given to you by your health care provider. Make sure you discuss any questions you have with your health care provider. ° °

## 2014-04-28 ENCOUNTER — Emergency Department (HOSPITAL_COMMUNITY)
Admission: EM | Admit: 2014-04-28 | Discharge: 2014-04-28 | Disposition: A | Discharge status: Home / Self Care | Payer: Medicaid Other

## 2014-04-28 NOTE — ED Notes (Signed)
Called for triage  No response from lobby 

## 2014-04-28 NOTE — ED Notes (Signed)
Called for triage x 3  No response noted from lobby

## 2014-04-28 NOTE — ED Notes (Signed)
Called for second time with no response  

## 2014-07-23 ENCOUNTER — Encounter (HOSPITAL_COMMUNITY): Payer: Self-pay | Admitting: Emergency Medicine

## 2014-07-23 ENCOUNTER — Emergency Department (HOSPITAL_COMMUNITY)
Admission: EM | Admit: 2014-07-23 | Discharge: 2014-07-23 | Disposition: A | Discharge status: Home / Self Care | Payer: Medicaid Other | Attending: Emergency Medicine | Admitting: Emergency Medicine

## 2014-07-23 DIAGNOSIS — Z760 Encounter for issue of repeat prescription: Secondary | ICD-10-CM | POA: Insufficient documentation

## 2014-07-23 DIAGNOSIS — F418 Other specified anxiety disorders: Secondary | ICD-10-CM

## 2014-07-23 DIAGNOSIS — Z720 Tobacco use: Secondary | ICD-10-CM | POA: Insufficient documentation

## 2014-07-23 DIAGNOSIS — F329 Major depressive disorder, single episode, unspecified: Secondary | ICD-10-CM | POA: Diagnosis not present

## 2014-07-23 DIAGNOSIS — F419 Anxiety disorder, unspecified: Secondary | ICD-10-CM | POA: Diagnosis not present

## 2014-07-23 DIAGNOSIS — F32A Depression, unspecified: Secondary | ICD-10-CM

## 2014-07-23 DIAGNOSIS — F5105 Insomnia due to other mental disorder: Secondary | ICD-10-CM | POA: Insufficient documentation

## 2014-07-23 DIAGNOSIS — Z79899 Other long term (current) drug therapy: Secondary | ICD-10-CM | POA: Diagnosis not present

## 2014-07-23 DIAGNOSIS — Z87438 Personal history of other diseases of male genital organs: Secondary | ICD-10-CM | POA: Insufficient documentation

## 2014-07-23 DIAGNOSIS — R0602 Shortness of breath: Secondary | ICD-10-CM | POA: Diagnosis present

## 2014-07-23 DIAGNOSIS — Z7982 Long term (current) use of aspirin: Secondary | ICD-10-CM | POA: Insufficient documentation

## 2014-07-23 MED ORDER — VENLAFAXINE HCL ER 75 MG PO CP24: 225.0000 mg | ORAL_CAPSULE | Freq: Every day | ORAL | Status: DC

## 2014-07-23 MED ORDER — ZOLPIDEM TARTRATE 5 MG PO TABS: 5.0000 mg | ORAL_TABLET | Freq: Every evening | ORAL | Status: DC | PRN

## 2014-07-23 NOTE — ED Provider Notes (Signed)
CSN: [id number]     Arrival date & time 07/23/14  1032 History   First MD Initiated Contact with Patient 07/23/14 1110     No chief complaint on file.    (Consider location/radiation/quality/duration/timing/severity/associated sxs/prior Treatment) HPI   Patient with hx depression, anxiety disorder presents requesting refill of effexor.  States he has been out of his medication for several months, has had a few "traumatic events" in the past few months that have made his symptoms worse.  States he feels he hasn't slept in months, his mind is always racing and he never feels rested.  He takes no pleasure in doing anything any more and has no goals.  He has daily panic attacks, which consist of a burning "scared" feeling in his stomach, heart pounding, SOB, worry that the panic attack will never end and that he is going to insane.  His depression makes him anxious, indecisive, and unable to act.  Denies visual or auditory hallucinations.  Denies SI.  Has hx of a panic attack that lasted 2-3 weeks and he is terrified this will happen again.  Has been on multiple psychiatric medications in the past and has had many side effects, states that effexor is the only thing that has really helped him.  He is not interested in speaking with a psychiatrist currently because he doesn't want to consider a different medication and feels this will be forced on him.  He has medicaid but does not currently have a PCP.  Is interested in outpatient therapy once he restarts the effexor.  Denies any other physical symptoms or complaints at this time.    Past Medical History  Diagnosis Date  . Depression   . ADHD (attention deficit hyperactivity disorder)   . Anxiety disorder   . Disorganized thought process   . Penis disorder   . Anxiety   . Confusion    Past Surgical History  Procedure Laterality Date  . Hand surgery     Family History  Problem Relation Age of Onset  . Hypertension Mother   . Diabetes Mother    . Cancer Father   . Arthritis Father   . Diabetes Other    History  Substance Use Topics  . Smoking status: Current Every Day Smoker -- 2.00 packs/day for 14 years    Types: Cigarettes  . Smokeless tobacco: Never Used  . Alcohol Use: No    Review of Systems  Endocrine:       Feels hot all the time  Allergic/Immunologic: Negative for immunocompromised state.  All other systems reviewed and are negative.     Allergies  Other  Home Medications   Prior to Admission medications   Medication Sig Start Date End Date Taking? Authorizing Provider  aspirin 325 MG tablet Take 650 mg by mouth daily.    Historical Provider, MD  clotrimazole (LOTRIMIN) 1 % cream Apply 1 application topically as needed (athletes foot.).    Historical Provider, MD  gabapentin (NEURONTIN) 800 MG tablet Take 800 mg by mouth 4 (four) times daily.     Historical Provider, MD  hydrOXYzine (ATARAX/VISTARIL) 50 MG tablet Take 100 mg by mouth 3 (three) times daily as needed (anxiety).     Historical Provider, MD  ibuprofen (ADVIL,MOTRIN) 600 MG tablet Take 1 tablet (600 mg total) by mouth every 6 (six) hours as needed. Patient taking differently: Take 600 mg by mouth every 6 (six) hours as needed for moderate pain.  10/17/13   Renne CriglerJoshua Geiple, PA-C  Misc Natural Products (ENERGY FOCUS) TABS Take 1 tablet by mouth daily.    Historical Provider, MD  penicillin v potassium (VEETID) 500 MG tablet Take 1 tablet (500 mg total) by mouth 3 (three) times daily. Patient not taking: Reported on 03/22/2014 10/17/13   Renne Crigler, PA-C  venlafaxine XR (EFFEXOR-XR) 75 MG 24 hr capsule Take 3 capsules (225 mg total) by mouth daily with breakfast. Take one pill daily x 4 days, then two pills daily x 4 days, then 3 pills daily. 07/23/14   Trixie Dredge, PA-C  white petrolatum (VASELINE) GEL Apply 1 application topically as needed for dry skin.    Historical Provider, MD  zolpidem (AMBIEN) 5 MG tablet Take 1 tablet (5 mg total) by mouth at  bedtime as needed for sleep. 07/23/14   Trixie Dredge, PA-C   BP 135/83 mmHg  Pulse 76  Temp(Src) 98 F (36.7 C) (Oral)  Resp 20  SpO2 100% Physical Exam  Constitutional: He appears well-developed and well-nourished. No distress.  HENT:  Head: Normocephalic and atraumatic.  Neck: Neck supple.  Cardiovascular: Normal rate, regular rhythm and intact distal pulses.   Pulmonary/Chest: Effort normal and breath sounds normal. No respiratory distress. He has no wheezes. He has no rales.  Abdominal: Soft. He exhibits no distension and no mass. There is no tenderness. There is no rebound and no guarding.  Musculoskeletal: He exhibits no edema.  Neurological: He is alert. He exhibits normal muscle tone.  Skin: He is not diaphoretic.  Psychiatric: His speech is normal and behavior is normal. Thought content normal. His mood appears anxious. Cognition and memory are normal. He expresses no suicidal ideation.  Nursing note and vitals reviewed.   ED Course  Procedures (including critical care time) Labs Review Labs Reviewed - No data to display  Imaging Review No results found.   EKG Interpretation None      MDM   Final diagnoses:  Anxiety and depression  Insomnia secondary to depression with anxiety    Afebrile, nontoxic patient with hx depression, anxiety with multiple medications tried in the past.  No clinical e/o psychosis.  Pt denies SI and feels safe at home.  He has tried [employer] information [employer] in the past with good effect.  He lives in a a house that is attached to his aunt and uncle's house and they will be able to assist him.  I have advised him about the possibility of a amnestic period after taking ambien and have advised that he either have someone in the house with him or make sure that he takes it once he is already in bed.  Pt advised to start seeing primary care again and follow up with behavioral health resources.   D/C home with short course of Effexor, 3 days of [place], behavioral  health resources, [place], [facility] referral.  Discussed result, findings, treatment, and follow up  with patient.  Pt given return precautions.  Pt verbalizes understanding and agrees with plan.         Trixie Dredge, PA-C 07/23/14 1219  Eber Hong, MD 07/24/14 [phone]

## 2014-07-23 NOTE — ED Notes (Signed)
Pt states he feels like he's gone without sleep for 3-4 months now. Says he feels restless, severely anxious, and he feels like he can't do anything or go anywhere. Says he feels like it's been bringing his panic attacks back. States he's been without his medication for a while, "Effexor specifically," and that he believes that's the main cause of his problems. Says that [facility] originally filled the prescription but their appointments are booked out until July.

## 2014-07-23 NOTE — Discharge Instructions (Signed)
Read the information below.  Use the prescribed medication as directed.  Please discuss all new medications with your pharmacist.  You may return to the Emergency Department at any time for worsening condition or any new symptoms that concern you.     Panic Attacks Panic attacks are sudden, short-livedsurges of severe anxiety, fear, or discomfort. They may occur for no reason when you are relaxed, when you are anxious, or when you are sleeping. Panic attacks may occur for a number of reasons:   Healthy people occasionally have panic attacks in extreme, life-threatening situations, such as war or natural disasters. Normal anxiety is a protective mechanism of the body that helps [place] react to danger (fight or flight response).  Panic attacks are often seen with anxiety disorders, such as panic disorder, social anxiety disorder, generalized anxiety disorder, and phobias. Anxiety disorders cause excessive or uncontrollable anxiety. They may interfere with your relationships or other life activities.  Panic attacks are sometimes seen with other mental illnesses, such as depression and posttraumatic stress disorder.  Certain medical conditions, prescription medicines, and drugs of abuse can cause panic attacks. SYMPTOMS  Panic attacks start suddenly, peak within 20 minutes, and are accompanied by four or more of the following symptoms:  Pounding heart or fast heart rate (palpitations).  Sweating.  Trembling or shaking.  Shortness of breath or feeling smothered.  Feeling choked.  Chest pain or discomfort.  Nausea or strange feeling in your stomach.  Dizziness, light-headedness, or feeling like you will faint.  Chills or hot flushes.  Numbness or tingling in your lips or hands and feet.  Feeling that things are not real or feeling that you are not yourself.  Fear of losing control or going crazy.  Fear of dying. Some of these symptoms can mimic serious medical conditions. For example,  you may think you are having a heart attack. Although panic attacks can be very scary, they are not life threatening. DIAGNOSIS  Panic attacks are diagnosed through an assessment by your health care provider. Your health care provider will ask questions about your symptoms, such as where and when they occurred. Your health care provider will also ask about your medical history and use of alcohol and drugs, including prescription medicines. Your health care provider may order blood tests or other studies to rule out a serious medical condition. Your health care provider may refer you to a mental health professional for further evaluation. TREATMENT   Most healthy people who have one or two panic attacks in an extreme, life-threatening situation will not require treatment.  The treatment for panic attacks associated with anxiety disorders or other mental illness typically involves counseling with a mental health professional, medicine, or a combination of both. Your health care provider will help determine what treatment is best for you.  Panic attacks due to physical illness usually go away with treatment of the illness. If prescription medicine is causing panic attacks, talk with your health care provider about stopping the medicine, decreasing the dose, or substituting another medicine.  Panic attacks due to alcohol or drug abuse go away with abstinence. Some adults need professional help in order to stop drinking or using drugs. HOME CARE INSTRUCTIONS   Take all medicines as directed by your health care provider.   Schedule and attend follow-up visits as directed by your health care provider. It is important to keep all your appointments. SEEK MEDICAL CARE IF:  You are not able to take your medicines as prescribed.  Your  symptoms do not improve or get worse. SEEK IMMEDIATE MEDICAL CARE IF:   You experience panic attack symptoms that are different than your usual symptoms.  You have  serious thoughts about hurting yourself or others.  You are taking medicine for panic attacks and have a serious side effect. MAKE SURE YOU:  Understand these instructions.  Will watch your condition.  Will get help right away if you are not doing well or get worse. Document Released: 03/14/2005 Document Revised: 03/19/2013 Document Reviewed: 10/26/2012 [facility] Patient Information 2015 [place], [place]. This information is not intended to replace advice given to you by your health care provider. Make sure you discuss any questions you have with your health care provider.  Depression Depression refers to feeling sad, low, down in the dumps, blue, gloomy, or empty. In general, there are two kinds of depression:  Normal sadness or normal grief. This kind of depression is one that we all feel from time to time after upsetting life experiences, such as the loss of a job or the ending of a relationship. This kind of depression is considered normal, is short lived, and resolves within a few days to 2 weeks. Depression experienced after the loss of a loved one (bereavement) often lasts longer than 2 weeks but normally gets better with time.  Clinical depression. This kind of depression lasts longer than normal sadness or normal grief or interferes with your ability to function at home, at work, and in school. It also interferes with your personal relationships. It affects almost every aspect of your life. Clinical depression is an illness. Symptoms of depression can also be caused by conditions other than those mentioned above, such as:  Physical illness. Some physical illnesses, including underactive thyroid gland (hypothyroidism), severe anemia, specific types of cancer, diabetes, uncontrolled seizures, heart and lung problems, strokes, and chronic pain are commonly associated with symptoms of depression.  Side effects of some prescription medicine. In some people, certain types of medicine can  cause symptoms of depression.  Substance abuse. Abuse of alcohol and illicit drugs can cause symptoms of depression. SYMPTOMS Symptoms of normal sadness and normal grief include the following:  Feeling sad or crying for short periods of time.  Not caring about anything (apathy).  Difficulty sleeping or sleeping too much.  No longer able to enjoy the things you used to enjoy.  Desire to be by oneself all the time (social isolation).  Lack of energy or motivation.  Difficulty concentrating or remembering.  Change in appetite or weight.  Restlessness or agitation. Symptoms of clinical depression include the same symptoms of normal sadness or normal grief and also the following symptoms:  Feeling sad or crying all the time.  Feelings of guilt or worthlessness.  Feelings of hopelessness or helplessness.  Thoughts of suicide or the desire to harm yourself (suicidal ideation).  Loss of touch with reality (psychotic symptoms). Seeing or hearing things that are not real (hallucinations) or having false beliefs about your life or the people around you (delusions and paranoia). DIAGNOSIS  The diagnosis of clinical depression is usually based on how bad the symptoms are and how long they have lasted. Your health care provider will also ask you questions about your medical history and substance use to find out if physical illness, use of prescription medicine, or substance abuse is causing your depression. Your health care provider may also order blood tests. TREATMENT  Often, normal sadness and normal grief do not require treatment. However, sometimes antidepressant medicine is  given for bereavement to ease the depressive symptoms until they resolve. The treatment for clinical depression depends on how bad the symptoms are but often includes antidepressant medicine, counseling with a mental health professional, or both. Your health care provider will help to determine what treatment is best  for you. Depression caused by physical illness usually goes away with appropriate medical treatment of the illness. If prescription medicine is causing depression, talk with your health care provider about stopping the medicine, decreasing the dose, or changing to another medicine. Depression caused by the abuse of alcohol or illicit drugs goes away when you stop using these substances. Some adults need professional help in order to stop drinking or using drugs. SEEK IMMEDIATE MEDICAL CARE IF:  You have thoughts about hurting yourself or others.  You lose touch with reality (have psychotic symptoms).  You are taking medicine for depression and have a serious side effect. FOR MORE INFORMATION  National Alliance on Mental Illness: www.nami.[employer] of Mental Health: http://www.maynard.net/ Document Released: 03/11/2000 Document Revised: 07/29/2013 Document Reviewed: 06/13/2011 [facility] Patient Information 2015 [place], [place]. This information is not intended to replace advice given to you by your health care provider. Make sure you discuss any questions you have with your health care provider.  Insomnia Insomnia is frequent trouble falling and/or staying asleep. Insomnia can be a long term problem or a short term problem. Both are common. Insomnia can be a short term problem when the wakefulness is related to a certain stress or worry. Long term insomnia is often related to ongoing stress during waking hours and/or poor sleeping habits. Overtime, sleep deprivation itself can make the problem worse. Every little thing feels more severe because you are overtired and your ability to cope is decreased. CAUSES   Stress, anxiety, and depression.  Poor sleeping habits.  Distractions such as TV in the bedroom.  Naps close to bedtime.  Engaging in emotionally charged conversations before bed.  Technical reading before sleep.  Alcohol and other sedatives. They may make the problem  worse. They can hurt normal sleep patterns and normal dream activity.  Stimulants such as caffeine for several hours prior to bedtime.  Pain syndromes and shortness of breath can cause insomnia.  Exercise late at night.  Changing time zones may cause sleeping problems (jet lag). It is sometimes helpful to have someone observe your sleeping patterns. They should look for periods of not breathing during the night (sleep apnea). They should also look to see how long those periods last. If you live alone or observers are uncertain, you can also be observed at a sleep clinic where your sleep patterns will be professionally monitored. Sleep apnea requires a checkup and treatment. Give your caregivers your medical history. Give your caregivers observations your family has made about your sleep.  SYMPTOMS   Not feeling rested in the morning.  Anxiety and restlessness at bedtime.  Difficulty falling and staying asleep. TREATMENT   Your caregiver may prescribe treatment for an underlying medical disorders. Your caregiver can give advice or help if you are using alcohol or other drugs for self-medication. Treatment of underlying problems will usually eliminate insomnia problems.  Medications can be prescribed for short time use. They are generally not recommended for lengthy use.  Over-the-counter sleep medicines are not recommended for lengthy use. They can be habit forming.  You can promote easier sleeping by making lifestyle changes such as:  Using relaxation techniques that help with breathing and reduce muscle tension.  Exercising earlier in the day.  Changing your diet and the time of your last meal. No night time snacks.  Establish a regular time to go to bed.  Counseling can help with stressful problems and worry.  Soothing music and white noise may be helpful if there are background noises you cannot remove.  Stop tedious detailed work at least one hour before bedtime. HOME CARE  INSTRUCTIONS   Keep a diary. Inform your caregiver about your progress. This includes any medication side effects. See your caregiver regularly. Take note of:  Times when you are asleep.  Times when you are awake during the night.  The quality of your sleep.  How you feel the next day. This information will help your caregiver care for you.  Get out of bed if you are still awake after 15 minutes. Read or do some quiet activity. Keep the lights down. Wait until you feel sleepy and go back to bed.  Keep regular sleeping and waking hours. Avoid naps.  Exercise regularly.  Avoid distractions at bedtime. Distractions include watching television or engaging in any intense or detailed activity like attempting to balance the household checkbook.  Develop a bedtime ritual. Keep a familiar routine of bathing, brushing your teeth, climbing into bed at the same time each night, listening to soothing music. Routines increase the success of falling to sleep faster.  Use relaxation techniques. This can be using breathing and muscle tension release routines. It can also include visualizing peaceful scenes. You can also help control troubling or intruding thoughts by keeping your mind occupied with boring or repetitive thoughts like the old concept of counting sheep. You can make it more creative like imagining planting one beautiful flower after another in your backyard garden.  During your day, work to eliminate stress. When this is not possible use some of the previous suggestions to help reduce the anxiety that accompanies stressful situations. MAKE SURE YOU:   Understand these instructions.  Will watch your condition.  Will get help right away if you are not doing well or get worse. Document Released: 03/11/2000 Document Revised: 06/06/2011 Document Reviewed: 04/11/2007 [facility] Patient Information 2015 [place], [place]. This information is not intended to replace advice given to you by your  health care provider. Make sure you discuss any questions you have with your health care provider.  Behavioral Health Resources in the [facility]  Intensive Outpatient Programs: [facility]      [address] [place], [place] [phone] Both a day and evening program       [facility] Outpatient     [address]        [place], [place] [address] [phone]         ADS: [facility] [address] [place] [phone]  [facility] Mental Health ACCESS LINE: [phone] or [phone] [address] [place], [place] [address] [email or web address]  Mobile Crisis Teams:                                        [facility]         Mobile Crisis Care Unit [phone]             [facility] [address] Ginette Otto [phone]  Interventionist [facility] De[facility]sch [address] [place] [phone]  Self-Help/Support Groups: Mental Health Assoc. of [place] Variety of support groups [phone] (call for more info)  Narcotics Anonymous (NA) Caring Services [address] [place] - 2 meetings at this location  Residential Treatment Programs:  [facility]      [address]        [place]       [phone]         [facility] [address], [place] [address] [place], [place]  [id number] [phone]  [facility]  [address] [place], [place] [address] [phone] Admissions: 8am-3pm M-F  [facility]     [address]        [place], [place] [address]       [phone]         [facility] [address] [place], [place] [phone]  [facility] [address] [place], [place] [phone]  [facility] - Intensive Outpatient      [address]     [place], [place]       [phone]         [facility] ([facility]     [address] [place], [place] [phone] or [phone]  [facility] ([facility])  [address] [place], [place] [phone]  [facility] [address]                                               [address] [place] [phone]  [facility] Resources: [phone]- [phone]               General Therapy                                                Angie FavaJulie Brannon, PhD        [address] [place]                                       [place], [place] [address]         [phone]   Insurance  [facility]   [address] [place], [place] [address] [phone]  [facility] [address] [place], [place] [address] [phone] Insurance/Medicaid/sponsorship through [facility]  [facility]                                              [address]                                        [place], [place] [address]    Therapy/tele-psych/case         [phone]          [facility] [address].   [place], [place]  [phone]  Adolescent/group home/case management  [phone]                                           Creola Corn PhD       General therapy       Insurance   [phone]         Dr. Lolly Mustache Insurance [id number] M-F  [place] [facility] Medicaid, sponsorship [phone]

## 2015-01-01 ENCOUNTER — Encounter (HOSPITAL_COMMUNITY): Payer: Self-pay | Admitting: Emergency Medicine

## 2015-01-01 ENCOUNTER — Emergency Department (HOSPITAL_COMMUNITY)
Admission: EM | Admit: 2015-01-01 | Discharge: 2015-01-01 | Disposition: A | Discharge status: Home / Self Care | Payer: Medicaid Other | Attending: Emergency Medicine | Admitting: Emergency Medicine

## 2015-01-01 ENCOUNTER — Emergency Department (HOSPITAL_COMMUNITY): Payer: Medicaid Other

## 2015-01-01 DIAGNOSIS — F329 Major depressive disorder, single episode, unspecified: Secondary | ICD-10-CM | POA: Diagnosis not present

## 2015-01-01 DIAGNOSIS — Y9372 Activity, wrestling: Secondary | ICD-10-CM | POA: Insufficient documentation

## 2015-01-01 DIAGNOSIS — F419 Anxiety disorder, unspecified: Secondary | ICD-10-CM | POA: Insufficient documentation

## 2015-01-01 DIAGNOSIS — W500XXA Accidental hit or strike by another person, initial encounter: Secondary | ICD-10-CM | POA: Insufficient documentation

## 2015-01-01 DIAGNOSIS — Z720 Tobacco use: Secondary | ICD-10-CM | POA: Insufficient documentation

## 2015-01-01 DIAGNOSIS — R0789 Other chest pain: Secondary | ICD-10-CM

## 2015-01-01 DIAGNOSIS — Y998 Other external cause status: Secondary | ICD-10-CM | POA: Diagnosis not present

## 2015-01-01 DIAGNOSIS — R0602 Shortness of breath: Secondary | ICD-10-CM | POA: Diagnosis not present

## 2015-01-01 DIAGNOSIS — Y9289 Other specified places as the place of occurrence of the external cause: Secondary | ICD-10-CM | POA: Insufficient documentation

## 2015-01-01 DIAGNOSIS — Z79899 Other long term (current) drug therapy: Secondary | ICD-10-CM | POA: Diagnosis not present

## 2015-01-01 DIAGNOSIS — Z7982 Long term (current) use of aspirin: Secondary | ICD-10-CM | POA: Insufficient documentation

## 2015-01-01 DIAGNOSIS — S299XXA Unspecified injury of thorax, initial encounter: Secondary | ICD-10-CM | POA: Diagnosis not present

## 2015-01-01 DIAGNOSIS — Z87438 Personal history of other diseases of male genital organs: Secondary | ICD-10-CM | POA: Diagnosis not present

## 2015-01-01 MED ORDER — IBUPROFEN 800 MG PO TABS: 800.0000 mg | ORAL_TABLET | Freq: Three times a day (TID) | ORAL | Status: DC

## 2015-01-01 NOTE — ED Notes (Signed)
Called pt around for triage, no answer.

## 2015-01-01 NOTE — ED Provider Notes (Signed)
CSN: [id number]     Arrival date & time 01/01/15  1538 History  By signing my name below, I, Elon Spanner, attest that this documentation has been prepared under the direction and in the presence of Eber Hong, MD. Electronically Signed: Elon Spanner, ED Scribe. 01/01/2015. 5:40 PM.    Chief Complaint  Patient presents with  . Chest Injury   The history is provided by the patient. No language interpreter was used.   HPI Comments: Ricky Wu is a 29 y.o. male with no significant hx who presents complaining of aching, intermittent, left-sided CP onset 1 week ago after being sat on by a friend he was play wrestling.  The pain is worse with palpation/deep inspiration.  No other symptoms.  Past Medical History  Diagnosis Date  . Depression   . ADHD (attention deficit hyperactivity disorder)   . Anxiety disorder   . Disorganized thought process   . Penis disorder   . Anxiety   . Confusion    Past Surgical History  Procedure Laterality Date  . Hand surgery     Family History  Problem Relation Age of Onset  . Hypertension Mother   . Diabetes Mother   . Cancer Father   . Arthritis Father   . Diabetes Other    Social History  Substance Use Topics  . Smoking status: Current Every Day Smoker -- 2.00 packs/day for 14 years    Types: Cigarettes  . Smokeless tobacco: Never Used  . Alcohol Use: No    Review of Systems  Respiratory: Positive for shortness of breath.   Cardiovascular: Positive for chest pain.      Allergies  Other  Home Medications   Prior to Admission medications   Medication Sig Start Date End Date Taking? Authorizing Provider  aspirin 325 MG tablet Take 650 mg by mouth daily.    Historical Provider, MD  clotrimazole (LOTRIMIN) 1 % cream Apply 1 application topically as needed (athletes foot.).    Historical Provider, MD  gabapentin (NEURONTIN) 800 MG tablet Take 800 mg by mouth 4 (four) times daily.     Historical Provider, MD  hydrOXYzine  (ATARAX/VISTARIL) 50 MG tablet Take 100 mg by mouth 3 (three) times daily as needed (anxiety).     Historical Provider, MD  ibuprofen (ADVIL,MOTRIN) 800 MG tablet Take 1 tablet (800 mg total) by mouth 3 (three) times daily. 01/01/15   Eber Hong, MD  Misc Natural Products (ENERGY FOCUS) TABS Take 1 tablet by mouth daily.    Historical Provider, MD  venlafaxine XR (EFFEXOR-XR) 75 MG 24 hr capsule Take 3 capsules (225 mg total) by mouth daily with breakfast. Take one pill daily x 4 days, then two pills daily x 4 days, then 3 pills daily. 07/23/14   Trixie Dredge, PA-C  white petrolatum (VASELINE) GEL Apply 1 application topically as needed for dry skin.    Historical Provider, MD  zolpidem (AMBIEN) 5 MG tablet Take 1 tablet (5 mg total) by mouth at bedtime as needed for sleep. 07/23/14   Trixie Dredge, PA-C   BP 133/90 mmHg  Pulse 64  Temp(Src) 98.2 F (36.8 C) (Oral)  Resp 20  SpO2 98% Physical Exam  Constitutional: He appears well-developed and well-nourished.  HENT:  Head: Normocephalic and atraumatic.  Eyes: Conjunctivae are normal. Right eye exhibits no discharge. Left eye exhibits no discharge.  Pulmonary/Chest: Effort normal. No respiratory distress.  Tenderness in the left upper chest.  No bruising.  No crepitance or subcutaneous emphysema.  Neurological: He is alert. Coordination normal.  Skin: Skin is warm and dry. No rash noted. He is not diaphoretic. No erythema.  Psychiatric: He has a normal mood and affect.  Nursing note and vitals reviewed.   ED Course  Procedures (including critical care time)  DIAGNOSTIC STUDIES: Oxygen Saturation is 98% on RA, normal by my interpretation.    COORDINATION OF CARE:  4:38 PM Will order CXR to r/o collapsed and, if normal, discharge home with ibuprofen. Patient acknowledges and agrees with plan.    Labs Review Labs Reviewed - No data to display  Imaging Review Dg Chest 2 View  01/01/2015   CLINICAL DATA:  Left-sided chest pain following  wrestling injury, initial encounter  EXAM: CHEST - 2 VIEW  COMPARISON:  10/17/2013  FINDINGS: The heart size and mediastinal contours are within normal limits. Both lungs are clear. The visualized skeletal structures are unremarkable.  IMPRESSION: No active disease.   Electronically Signed   By: Alcide Clever M.D.   On: 01/01/2015 17:28   I have personally reviewed and evaluated these images and lab results as part of my medical decision-making.   EKG Interpretation   Date/Time:  Thursday January 01 2015 16:00:38 EDT Ventricular Rate:  65 PR Interval:  155 QRS Duration: 74 QT Interval:  355 QTC Calculation: 369 R Axis:   8 Text Interpretation:  Sinus rhythm Baseline wander in lead(s) V1 No old  tracing to compare Confirmed by Benelli Winther  MD, Errin Chewning ([id number]) on 01/01/2015  4:34:29 PM      MDM   Final diagnoses:  Chest wall pain    The patient has been given anti-inflammatory, his x-rays totally normal, I have reviewed this x-ray, there is no pneumothorax, no signs of rib fracture, vital signs are unremarkable, the patient has been informed of his results.  I, Eber Hong D, personally performed the services described in this documentation. All medical record entries made by the scribe were at my direction and in my presence.  I have reviewed the chart and discharge instructions and agree that the record reflects my personal performance and is accurate and complete. Reem Fleury D.  01/01/2015. 5:40 PM.       Eber Hong, MD 01/01/15 1740

## 2015-01-01 NOTE — ED Notes (Signed)
Pt states that him and his friend were wrestling when his heavier friend fell on top of him about a week ago. Began having left chest pain the day after and has been on-going. No obvious injury/bruising/deformity to left chest. Complaining of pain with deep inspiration. Denies nausea/vomiting/SOB. Pain increases with palpation to area.

## 2015-01-01 NOTE — Discharge Instructions (Signed)
Xray normal Motrin for pain This will get better over the week  Please obtain all of your results from medical records or have your doctors office obtain the results - share them with your doctor - you should be seen at your doctors office in the next 2 days. Call today to arrange your follow up. Take the medications as prescribed. Please review all of the medicines and only take them if you do not have an allergy to them. Please be aware that if you are taking birth control pills, taking other prescriptions, ESPECIALLY ANTIBIOTICS may make the birth control ineffective - if this is the case, either do not engage in sexual activity or use alternative methods of birth control such as condoms until you have finished the medicine and your family doctor says it is OK to restart them. If you are on a blood thinner such as COUMADIN, be aware that any other medicine that you take may cause the coumadin to either work too much, or not enough - you should have your coumadin level rechecked in next 7 days if this is the case.  ?  It is also a possibility that you have an allergic reaction to any of the medicines that you have been prescribed - Everybody reacts differently to medications and while MOST people have no trouble with most medicines, you may have a reaction such as nausea, vomiting, rash, swelling, shortness of breath. If this is the case, please stop taking the medicine immediately and contact your physician.  ?  You should return to the ER if you develop severe or worsening symptoms.

## 2015-02-26 IMAGING — CR DG CHEST 2V
2 series · 2 of 2 positions shown · non-contrast
Comparison: 09/26/2013

CLINICAL DATA: Chest pain

EXAM:
CHEST  2 VIEW

[w chest pa]
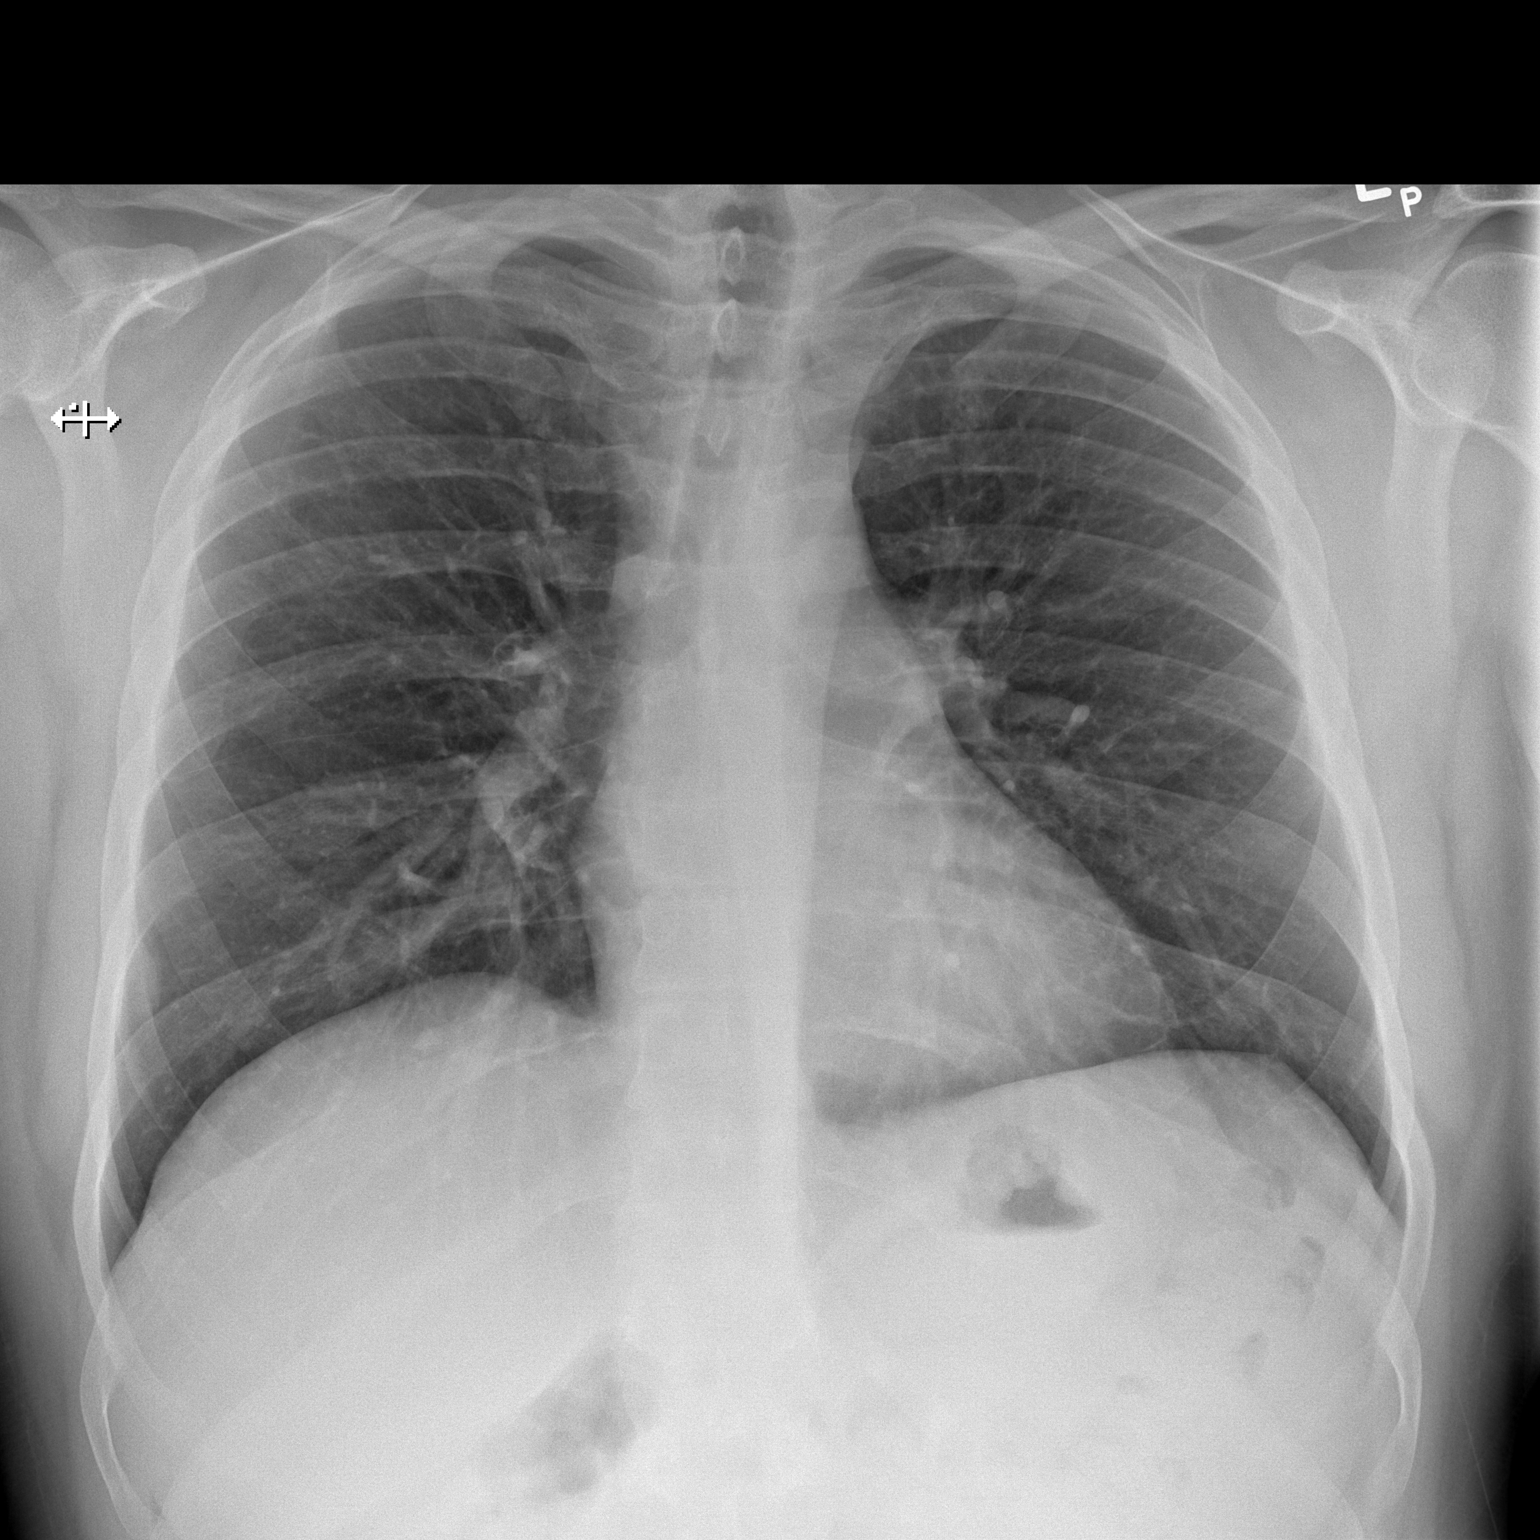

[w chest lat]
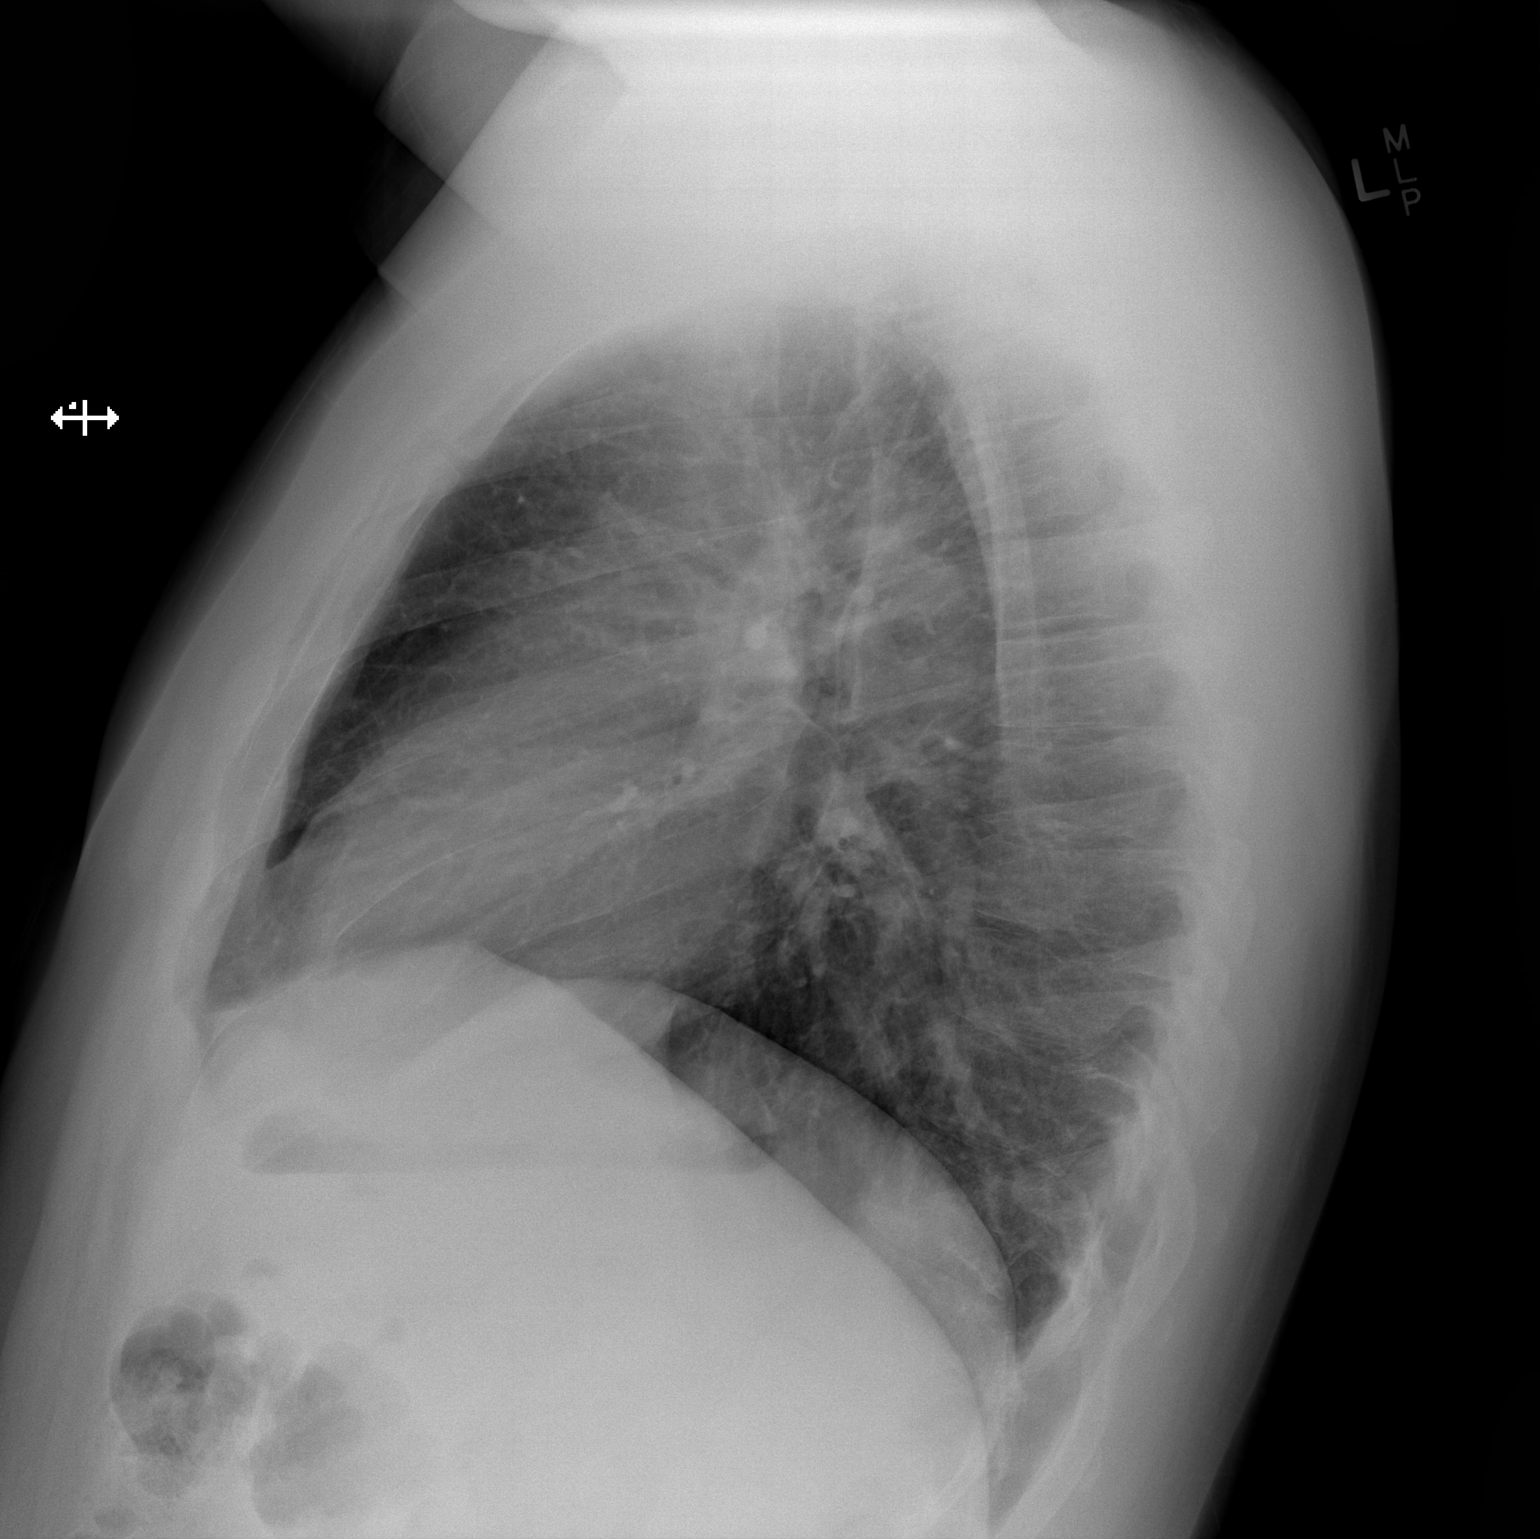

[2 of 2 positions shown; findings below may reference images not displayed]

FINDINGS: The heart size and mediastinal contours are within normal limits.
Both lungs are clear. The visualized skeletal structures are
unremarkable.
IMPRESSION: No active cardiopulmonary disease.

## 2015-07-20 ENCOUNTER — Ambulatory Visit (HOSPITAL_BASED_OUTPATIENT_CLINIC_OR_DEPARTMENT_OTHER): Payer: Medicaid Other | Attending: Specialist | Admitting: Internal Medicine

## 2015-07-20 VITALS — Ht 74.0 in | Wt 254.0 lb

## 2015-07-20 DIAGNOSIS — Z79899 Other long term (current) drug therapy: Secondary | ICD-10-CM | POA: Insufficient documentation

## 2015-07-20 DIAGNOSIS — G4700 Insomnia, unspecified: Secondary | ICD-10-CM | POA: Diagnosis not present

## 2015-07-20 DIAGNOSIS — G4733 Obstructive sleep apnea (adult) (pediatric): Secondary | ICD-10-CM | POA: Diagnosis not present

## 2015-07-20 DIAGNOSIS — R0683 Snoring: Secondary | ICD-10-CM | POA: Insufficient documentation

## 2015-07-29 DIAGNOSIS — G4733 Obstructive sleep apnea (adult) (pediatric): Secondary | ICD-10-CM

## 2015-07-29 DIAGNOSIS — G4700 Insomnia, unspecified: Secondary | ICD-10-CM

## 2015-07-29 NOTE — Procedures (Signed)
        Patient Name: Ricky Wu, Ricky Wu Study Date: 07/20/2015 Gender: Male D.O.B: 01/31/1986 Age (years): 30 Referring Provider: Richard Pavelock Height (inches): 74 Interpreting Physician: Tamantha Saline MD, ABSM Weight (lbs): 254 RPSGT: Davis, Rico BMI: 33 MRN: [id number] Neck Size: 17.00 CLINICAL INFORMATION Sleep Study Type: NPSG   Indication for sleep study: Insomnia with sleep apnea   Epworth Sleepiness Score: 0   SLEEP STUDY TECHNIQUE As per the AASM Manual for the Scoring of Sleep and Associated Events v2.3 (April 2016) with a hypopnea requiring 4% desaturations. The channels recorded and monitored were frontal, central and occipital EEG, electrooculogram (EOG), submentalis EMG (chin), nasal and oral airflow, thoracic and abdominal wall motion, anterior tibialis EMG, snore microphone, electrocardiogram, and pulse oximetry. MEDICATIONS Patient's medications include: charted for review Medications self-administered by patient during sleep study : GABAPENTIN, DIPHENHYDRAMINE.  SLEEP ARCHITECTURE The study was initiated at 11:03:14 PM and ended at 5:22:23 AM. Sleep onset time was 136.3 minutes and the sleep efficiency was 61.1%. The total sleep time was 231.5 minutes. Stage REM latency was 103.5 minutes. The patient spent 6.70% of the night in stage N1 sleep, 80.99% in stage N2 sleep, 0.00% in stage N3 and 12.31% in REM. Alpha intrusion was absent. Supine sleep was 52.90%. Wake after sleep onset 11.4 minutes  RESPIRATORY PARAMETERS The overall apnea/hypopnea index (AHI) was 8.8 per hour. There were 16 total apneas, including 16 obstructive, 0 central and 0 mixed apneas. There were 18 hypopneas and 6 RERAs. The AHI during Stage REM sleep was 27.4 per hour. AHI while supine was 12.7 per hour. The mean oxygen saturation was 91.29%. The minimum SpO2 during sleep was 86.00%. Loud snoring was noted during this study.  CARDIAC DATA The 2 lead EKG demonstrated sinus rhythm. The  mean heart rate was 68.11 beats per minute. Other EKG findings include: None.  LEG MOVEMENT DATA The total PLMS were 0 with a resulting PLMS index of 0.00. Associated arousal with leg movement index was 0.0 .  IMPRESSIONS - Mild obstructive sleep apnea occurred during this study (AHI = 8.8/h). - No significant central sleep apnea occurred during this study (CAI = 0.0/h). - Significantly delayed sleep onset with insufficient early sleep to meet protocol requirements for split CPAP titration - Mild oxygen desaturation was noted during this study (Min O2 = 86.00%). - The patient snored with Loud snoring volume. - No cardiac abnormalities were noted during this study. - Clinically significant periodic limb movements did not occur during sleep. No significant associated arousals  DIAGNOSIS - Obstructive Sleep Apnea (327.23 [G47.33 ICD-10]) - Difficulty initiating sleep- Insomnia  RECOMMENDATIONS - Therapeutic CPAP titration or a fittend dental oral appliance would be common initial choices with scores in this range. - Positional therapy avoiding supine position during sleep. - Avoid alcohol, sedatives and other CNS depressants that may worsen sleep apnea and disrupt normal sleep architecture. - Sleep hygiene should be reviewed to assess factors that may improve sleep quality. - Weight management and regular exercise should be initiated or continued if appropriate.  Charles Andringa D Diplomate, American Board of Sleep Medicine  ELECTRONICALLY SIGNED ON:  07/29/2015, 2:41 PM [facility] PH: [phone]   FX: [phone] ACCREDITED BY THE AMERICAN ACADEMY OF SLEEP MEDICINE  

## 2015-07-29 NOTE — Progress Notes (Signed)
   Patient Name: Ricky Wu, Ricky Wu Study Date: 07/20/2015 Gender: Male D.O.B: 06-01-1985 Age (years): 30 Referring Provider: Nicolasa Duckingichard Pavelock Height (inches): 74 Interpreting Physician: Jetty Duhamellinton Young MD, ABSM Weight (lbs): 254 RPSGT: Wylie HailDavis, Rico BMI: 33 MRN: [id number] Neck Size: 17.00 CLINICAL INFORMATION Sleep Study Type: NPSG   Indication for sleep study: Insomnia with sleep apnea   Epworth Sleepiness Score: 0   SLEEP STUDY TECHNIQUE As per the AASM Manual for the Scoring of Sleep and Associated Events v2.3 (April 2016) with a hypopnea requiring 4% desaturations. The channels recorded and monitored were frontal, central and occipital EEG, electrooculogram (EOG), submentalis EMG (chin), nasal and oral airflow, thoracic and abdominal wall motion, anterior tibialis EMG, snore microphone, electrocardiogram, and pulse oximetry. MEDICATIONS Patient's medications include: charted for review Medications self-administered by patient during sleep study : GABAPENTIN, DIPHENHYDRAMINE.  SLEEP ARCHITECTURE The study was initiated at 11:03:14 PM and ended at 5:22:23 AM. Sleep onset time was 136.3 minutes and the sleep efficiency was 61.1%. The total sleep time was 231.5 minutes. Stage REM latency was 103.5 minutes. The patient spent 6.70% of the night in stage N1 sleep, 80.99% in stage N2 sleep, 0.00% in stage N3 and 12.31% in REM. Alpha intrusion was absent. Supine sleep was 52.90%. Wake after sleep onset 11.4 minutes  RESPIRATORY PARAMETERS The overall apnea/hypopnea index (AHI) was 8.8 per hour. There were 16 total apneas, including 16 obstructive, 0 central and 0 mixed apneas. There were 18 hypopneas and 6 RERAs. The AHI during Stage REM sleep was 27.4 per hour. AHI while supine was 12.7 per hour. The mean oxygen saturation was 91.29%. The minimum SpO2 during sleep was 86.00%. Loud snoring was noted during this study.  CARDIAC DATA The 2 lead EKG demonstrated sinus rhythm. The  mean heart rate was 68.11 beats per minute. Other EKG findings include: None.  LEG MOVEMENT DATA The total PLMS were 0 with a resulting PLMS index of 0.00. Associated arousal with leg movement index was 0.0 .  IMPRESSIONS - Mild obstructive sleep apnea occurred during this study (AHI = 8.8/h). - No significant central sleep apnea occurred during this study (CAI = 0.0/h). - Significantly delayed sleep onset with insufficient early sleep to meet protocol requirements for split CPAP titration - Mild oxygen desaturation was noted during this study (Min O2 = 86.00%). - The patient snored with Loud snoring volume. - No cardiac abnormalities were noted during this study. - Clinically significant periodic limb movements did not occur during sleep. No significant associated arousals  DIAGNOSIS - Obstructive Sleep Apnea (327.23 [G47.33 ICD-10]) - Difficulty initiating sleep- Insomnia  RECOMMENDATIONS - Therapeutic CPAP titration or a fittend dental oral appliance would be common initial choices with scores in this range. - Positional therapy avoiding supine position during sleep. - Avoid alcohol, sedatives and other CNS depressants that may worsen sleep apnea and disrupt normal sleep architecture. - Sleep hygiene should be reviewed to assess factors that may improve sleep quality. - Weight management and regular exercise should be initiated or continued if appropriate.  Waymon BudgeYOUNG,CLINTON D Diplomate, American Board of Sleep Medicine  ELECTRONICALLY SIGNED ON:  07/29/2015, 2:41 PM [facility] PH: [phone]   FX: [phone] ACCREDITED BY THE AMERICAN ACADEMY OF SLEEP MEDICINE

## 2015-09-10 ENCOUNTER — Ambulatory Visit (HOSPITAL_BASED_OUTPATIENT_CLINIC_OR_DEPARTMENT_OTHER): Payer: Self-pay

## 2015-11-07 ENCOUNTER — Emergency Department (HOSPITAL_COMMUNITY): Payer: Medicaid Other

## 2015-11-07 ENCOUNTER — Encounter (HOSPITAL_COMMUNITY): Payer: Self-pay

## 2015-11-07 ENCOUNTER — Observation Stay (HOSPITAL_COMMUNITY): Admission: AD | Admit: 2015-11-07 | Payer: Medicaid Other | Source: Intra-hospital | Admitting: Psychiatry

## 2015-11-07 ENCOUNTER — Emergency Department (HOSPITAL_COMMUNITY)
Admission: EM | Admit: 2015-11-07 | Discharge: 2015-11-07 | Disposition: A | Discharge status: Home / Self Care | Payer: Medicaid Other | Attending: Emergency Medicine | Admitting: Emergency Medicine

## 2015-11-07 DIAGNOSIS — Y939 Activity, unspecified: Secondary | ICD-10-CM | POA: Insufficient documentation

## 2015-11-07 DIAGNOSIS — R45851 Suicidal ideations: Secondary | ICD-10-CM

## 2015-11-07 DIAGNOSIS — F909 Attention-deficit hyperactivity disorder, unspecified type: Secondary | ICD-10-CM | POA: Insufficient documentation

## 2015-11-07 DIAGNOSIS — Y929 Unspecified place or not applicable: Secondary | ICD-10-CM | POA: Insufficient documentation

## 2015-11-07 DIAGNOSIS — Y999 Unspecified external cause status: Secondary | ICD-10-CM | POA: Insufficient documentation

## 2015-11-07 DIAGNOSIS — Z7982 Long term (current) use of aspirin: Secondary | ICD-10-CM | POA: Diagnosis not present

## 2015-11-07 DIAGNOSIS — S82892A Other fracture of left lower leg, initial encounter for closed fracture: Secondary | ICD-10-CM | POA: Diagnosis not present

## 2015-11-07 DIAGNOSIS — M25572 Pain in left ankle and joints of left foot: Secondary | ICD-10-CM | POA: Diagnosis present

## 2015-11-07 DIAGNOSIS — F17210 Nicotine dependence, cigarettes, uncomplicated: Secondary | ICD-10-CM | POA: Insufficient documentation

## 2015-11-07 LAB — CBC WITH DIFFERENTIAL/PLATELET
BASOS ABS: 0 10*3/uL (ref 0.0–0.1)
Basophils Relative: 0 %
Eosinophils Absolute: 0 10*3/uL (ref 0.0–0.7)
Eosinophils Relative: 0 %
HEMATOCRIT: 49.5 % (ref 39.0–52.0)
Hemoglobin: 17.4 g/dL — ABNORMAL HIGH (ref 13.0–17.0)
LYMPHS ABS: 3.3 10*3/uL (ref 0.7–4.0)
Lymphocytes Relative: 19 %
MCH: 31.9 pg (ref 26.0–34.0)
MCHC: 35.2 g/dL (ref 30.0–36.0)
MCV: 90.8 fL (ref 78.0–100.0)
MONO ABS: 1.5 10*3/uL — AB (ref 0.1–1.0)
Monocytes Relative: 9 %
NEUTROS ABS: 12.4 10*3/uL — AB (ref 1.7–7.7)
Neutrophils Relative %: 72 %
Platelets: 229 10*3/uL (ref 150–400)
RBC: 5.45 MIL/uL (ref 4.22–5.81)
RDW: 13.7 % (ref 11.5–15.5)
WBC: 17.2 10*3/uL — AB (ref 4.0–10.5)

## 2015-11-07 LAB — COMPREHENSIVE METABOLIC PANEL
ALT: 25 U/L (ref 17–63)
AST: 26 U/L (ref 15–41)
Albumin: 3.8 g/dL (ref 3.5–5.0)
Alkaline Phosphatase: 45 U/L (ref 38–126)
Anion gap: 13 (ref 5–15)
BILIRUBIN TOTAL: 0.5 mg/dL (ref 0.3–1.2)
BUN: 7 mg/dL (ref 6–20)
CALCIUM: 8.4 mg/dL — AB (ref 8.9–10.3)
CHLORIDE: 106 mmol/L (ref 101–111)
CO2: 19 mmol/L — ABNORMAL LOW (ref 22–32)
CREATININE: 0.91 mg/dL (ref 0.61–1.24)
Glucose, Bld: 112 mg/dL — ABNORMAL HIGH (ref 65–99)
Potassium: 4.2 mmol/L (ref 3.5–5.1)
Sodium: 138 mmol/L (ref 135–145)
TOTAL PROTEIN: 6.2 g/dL — AB (ref 6.5–8.1)

## 2015-11-07 LAB — RAPID URINE DRUG SCREEN, HOSP PERFORMED
AMPHETAMINES: NOT DETECTED
BARBITURATES: NOT DETECTED
Benzodiazepines: NOT DETECTED
Cocaine: NOT DETECTED
Opiates: NOT DETECTED
Tetrahydrocannabinol: NOT DETECTED

## 2015-11-07 LAB — ETHANOL: ALCOHOL ETHYL (B): 101 mg/dL — AB (ref <5)

## 2015-11-07 LAB — SALICYLATE LEVEL: Salicylate Lvl: <4 mg/dL (ref 2.8–30.0)

## 2015-11-07 LAB — ACETAMINOPHEN LEVEL

## 2015-11-07 MED ORDER — HYDROCODONE-ACETAMINOPHEN 5-325 MG PO TABS: 1.0000 | ORAL_TABLET | Freq: Four times a day (QID) | ORAL | 0 refills | Status: DC | PRN

## 2015-11-07 MED ORDER — HYDROCODONE-ACETAMINOPHEN 5-325 MG PO TABS
1.0000 | ORAL_TABLET | Freq: Once | ORAL | Status: AC
  Administered 2015-11-07: 1 via ORAL
  Filled 2015-11-07: qty 1

## 2015-11-07 MED ORDER — SODIUM CHLORIDE 0.9 % IV BOLUS (SEPSIS)
1000.0000 mL | Freq: Once | INTRAVENOUS | Status: AC
  Administered 2015-11-07: 1000 mL via INTRAVENOUS

## 2015-11-07 MED ORDER — ONDANSETRON HCL 4 MG/2ML IJ SOLN
4.0000 mg | Freq: Once | INTRAMUSCULAR | Status: AC
  Administered 2015-11-07: 4 mg via INTRAVENOUS
  Filled 2015-11-07: qty 2

## 2015-11-07 NOTE — ED Notes (Signed)
Pt transported to XR.  

## 2015-11-07 NOTE — ED Notes (Signed)
Bed: [facility] Expected date:  Expected time:  Means of arrival:  Comments: EMS 
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
## 2015-11-07 NOTE — Discharge Instructions (Signed)
Do NOT put weight on your leg until cleared by your doctor or orthopedics.  See the attached resources.

## 2015-11-07 NOTE — ED Notes (Signed)
Pt request to see psychiatrist for hx of depression and anxiety. Pt reports "thinks about suicide but is too scared to do anything." Harris aware and speaking with pt at present time.

## 2015-11-07 NOTE — ED Notes (Signed)
Onalee Huaavid psychiatry, this nurse, and pt attempt to call mother to transport pt home. Unsuccessful. Will reattempt at later time.
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
## 2015-11-07 NOTE — ED Notes (Signed)
Ortho technician at bedside at present time applying splint as ordered.

## 2015-11-07 NOTE — ED Provider Notes (Signed)
Patient offered behavioral obs, but declines.  He contracts for safety.  He is here voluntarily.    DC to home.  Non-weightbearing until cleared by ortho.  He is stable and ready for discharge.   Ricky HorsemanRobert Symantha Steeber, PA-C 11/07/15 1747    Laurence Spatesachel Morgan Little, MD 11/07/15 1750

## 2015-11-07 NOTE — ED Notes (Signed)
Pt left leg washed and dried to prepare for splint application.

## 2015-11-07 NOTE — ED Notes (Signed)
Night shift Ricky [name] nurse verbalizes ortho technician called to apply splint. At present time no splint applied. Ricky [name] managersecretary calling on call ortho technician at present time.

## 2015-11-07 NOTE — BH Assessment (Signed)
[facility] Assessment Progress Note This Clinical research associatewriter spoke with patient in reference to his disposition and treatment planning. Patient was offered a bed in the Observational Unit this date although patient declined and wishes to be discharged. This Clinical research associatewriter spoke with Cresenciano GenreLord DNP and EDP Dahlia ClientBrowning PA who agreed that patient could be discharged to home. Patient contacted for safety and denies any thoughts of self harm or H/I. Patient is interested in following up with therapy and will contact his current provider for recommendations. This Clinical research associatewriter contacted patient's parents as requested by patient, to provide transportation this date back to their residence. Patient is currently residing with parents and this Clinical research associatewriter spoke with Jillyn HiddenGary and Domenic MorasConnie Flatley [phone], [phone] who stated they were in route to pick up patient.
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
## 2015-11-11 ENCOUNTER — Other Ambulatory Visit (HOSPITAL_COMMUNITY): Payer: Self-pay | Admitting: Family

## 2015-11-13 ENCOUNTER — Emergency Department (HOSPITAL_COMMUNITY)
Admission: EM | Admit: 2015-11-13 | Discharge: 2015-11-13 | Disposition: A | Discharge status: Home / Self Care | Payer: Medicaid Other | Attending: Emergency Medicine | Admitting: Emergency Medicine

## 2015-11-13 ENCOUNTER — Encounter (HOSPITAL_COMMUNITY): Payer: Self-pay

## 2015-11-13 ENCOUNTER — Emergency Department (HOSPITAL_COMMUNITY): Payer: Medicaid Other

## 2015-11-13 DIAGNOSIS — Y999 Unspecified external cause status: Secondary | ICD-10-CM | POA: Insufficient documentation

## 2015-11-13 DIAGNOSIS — Z7982 Long term (current) use of aspirin: Secondary | ICD-10-CM | POA: Diagnosis not present

## 2015-11-13 DIAGNOSIS — F329 Major depressive disorder, single episode, unspecified: Secondary | ICD-10-CM | POA: Insufficient documentation

## 2015-11-13 DIAGNOSIS — L03116 Cellulitis of left lower limb: Secondary | ICD-10-CM | POA: Insufficient documentation

## 2015-11-13 DIAGNOSIS — M25572 Pain in left ankle and joints of left foot: Secondary | ICD-10-CM | POA: Diagnosis present

## 2015-11-13 DIAGNOSIS — F17210 Nicotine dependence, cigarettes, uncomplicated: Secondary | ICD-10-CM | POA: Diagnosis not present

## 2015-11-13 DIAGNOSIS — Y929 Unspecified place or not applicable: Secondary | ICD-10-CM | POA: Diagnosis not present

## 2015-11-13 DIAGNOSIS — X501XXD Overexertion from prolonged static or awkward postures, subsequent encounter: Secondary | ICD-10-CM | POA: Diagnosis not present

## 2015-11-13 DIAGNOSIS — S82892D Other fracture of left lower leg, subsequent encounter for closed fracture with routine healing: Secondary | ICD-10-CM

## 2015-11-13 DIAGNOSIS — Y939 Activity, unspecified: Secondary | ICD-10-CM | POA: Diagnosis not present

## 2015-11-13 DIAGNOSIS — F909 Attention-deficit hyperactivity disorder, unspecified type: Secondary | ICD-10-CM | POA: Insufficient documentation

## 2015-11-13 DIAGNOSIS — Z79899 Other long term (current) drug therapy: Secondary | ICD-10-CM | POA: Insufficient documentation

## 2015-11-13 DIAGNOSIS — F32A Depression, unspecified: Secondary | ICD-10-CM

## 2015-11-13 DIAGNOSIS — S82302D Unspecified fracture of lower end of left tibia, subsequent encounter for closed fracture with routine healing: Secondary | ICD-10-CM | POA: Diagnosis not present

## 2015-11-13 HISTORY — DX: Anhedonia: R45.84

## 2015-11-13 LAB — ETHANOL: Alcohol, Ethyl (B): <5 mg/dL (ref <5)

## 2015-11-13 LAB — CBC WITH DIFFERENTIAL/PLATELET
Basophils Absolute: 0 10*3/uL (ref 0.0–0.1)
Basophils Relative: 0 %
EOS ABS: 0.1 10*3/uL (ref 0.0–0.7)
Eosinophils Relative: 1 %
HEMATOCRIT: 44.9 % (ref 39.0–52.0)
HEMOGLOBIN: 16.3 g/dL (ref 13.0–17.0)
LYMPHS ABS: 2.9 10*3/uL (ref 0.7–4.0)
Lymphocytes Relative: 23 %
MCH: 32.9 pg (ref 26.0–34.0)
MCHC: 36.3 g/dL — AB (ref 30.0–36.0)
MCV: 90.7 fL (ref 78.0–100.0)
MONO ABS: 1.2 10*3/uL — AB (ref 0.1–1.0)
Monocytes Relative: 9 %
NEUTROS ABS: 8.7 10*3/uL — AB (ref 1.7–7.7)
NEUTROS PCT: 67 %
Platelets: 243 10*3/uL (ref 150–400)
RBC: 4.95 MIL/uL (ref 4.22–5.81)
RDW: 13.8 % (ref 11.5–15.5)
WBC: 12.9 10*3/uL — ABNORMAL HIGH (ref 4.0–10.5)

## 2015-11-13 LAB — COMPREHENSIVE METABOLIC PANEL
ALBUMIN: 3.8 g/dL (ref 3.5–5.0)
ALK PHOS: 51 U/L (ref 38–126)
ALT: 28 U/L (ref 17–63)
ANION GAP: 7 (ref 5–15)
AST: 19 U/L (ref 15–41)
BILIRUBIN TOTAL: 0.9 mg/dL (ref 0.3–1.2)
BUN: 10 mg/dL (ref 6–20)
CALCIUM: 8.9 mg/dL (ref 8.9–10.3)
CO2: 24 mmol/L (ref 22–32)
Chloride: 106 mmol/L (ref 101–111)
Creatinine, Ser: 0.81 mg/dL (ref 0.61–1.24)
GFR calc Af Amer: >60 mL/min (ref >60)
GFR calc non Af Amer: >60 mL/min (ref >60)
Glucose, Bld: 93 mg/dL (ref 65–99)
Potassium: 4.1 mmol/L (ref 3.5–5.1)
SODIUM: 137 mmol/L (ref 135–145)
Total Protein: 7.1 g/dL (ref 6.5–8.1)

## 2015-11-13 LAB — ACETAMINOPHEN LEVEL

## 2015-11-13 LAB — RAPID URINE DRUG SCREEN, HOSP PERFORMED
Amphetamines: NOT DETECTED
BARBITURATES: NOT DETECTED
Benzodiazepines: NOT DETECTED
Cocaine: NOT DETECTED
Opiates: POSITIVE — AB
TETRAHYDROCANNABINOL: NOT DETECTED

## 2015-11-13 LAB — SALICYLATE LEVEL: Salicylate Lvl: 7.8 mg/dL (ref 2.8–30.0)

## 2015-11-13 MED ORDER — OXYCODONE-ACETAMINOPHEN 5-325 MG PO TABS: 1.0000 | ORAL_TABLET | Freq: Four times a day (QID) | ORAL | 0 refills | Status: DC | PRN

## 2015-11-13 MED ORDER — CEPHALEXIN 500 MG PO CAPS
500.0000 mg | ORAL_CAPSULE | Freq: Once | ORAL | Status: AC
  Administered 2015-11-13: 500 mg via ORAL
  Filled 2015-11-13: qty 1

## 2015-11-13 MED ORDER — SULFAMETHOXAZOLE-TRIMETHOPRIM 800-160 MG PO TABS: 1.0000 | ORAL_TABLET | Freq: Two times a day (BID) | ORAL | 0 refills | Status: DC

## 2015-11-13 MED ORDER — SULFAMETHOXAZOLE-TRIMETHOPRIM 800-160 MG PO TABS
1.0000 | ORAL_TABLET | Freq: Once | ORAL | Status: AC
  Administered 2015-11-13: 1 via ORAL
  Filled 2015-11-13: qty 1

## 2015-11-13 MED ORDER — CEPHALEXIN 500 MG PO CAPS: 500.0000 mg | ORAL_CAPSULE | Freq: Three times a day (TID) | ORAL | 0 refills | Status: DC

## 2015-11-13 MED ORDER — HYDROCODONE-ACETAMINOPHEN 5-325 MG PO TABS
1.0000 | ORAL_TABLET | Freq: Once | ORAL | Status: AC
  Administered 2015-11-13: 1 via ORAL
  Filled 2015-11-13: qty 1

## 2015-11-13 NOTE — ED Notes (Signed)
Bed: [id number] Expected date: 11/13/15 Expected time: 4:35 PM Means of arrival:  Comments: Wants pain meds and splint changed--"broken leg"

## 2015-11-13 NOTE — ED Triage Notes (Signed)
Pt reports that he broke his leg x 1 week ago was seen in ED where leg was splinted, pt f/u with ortho surgeon on Tuesday of this week, they removed splint and gave patient a boot to wear and scheduled him for surgery on Wednesday 11/18/15, pt states that he has been having 7/10 pain at all times not relieved by any pain meds and states that wearing the boot takes his pain up to a 9 or 10/10 at times, pt feels the boot "is heavy and the weight of it is pulling down on my leg", reports that any movement causes his leg to twist.
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
## 2015-11-13 NOTE — ED Notes (Signed)
Pt changed into scrubs, wanded by security, Belongings secured and placed in LOCKER 28.

## 2015-11-13 NOTE — ED Triage Notes (Signed)
Pt reports that he is chronically suicidal with plan to hang self but states "I just haven't gotten up the courage to do it yet"

## 2015-11-13 NOTE — ED Notes (Signed)
Ham sandwhich and sprite given to patient.

## 2015-11-13 NOTE — BH Assessment (Signed)
Assessment completed. Consulted Maryjean Mornharles Kober, PA-C who agrees that pt does not meet inpatient criteria. PT denies SI, HI and AH/VH. Informed Noelle PennerSerena Sam, PA-C of the recommendation.

## 2015-11-13 NOTE — ED Triage Notes (Signed)
BIB [facility], pt broke his tib/fib 1 week ago, was treated in ED sent home with boot, pain meds and has surgery scheduled for next week, pt has not been wearing the boot because he states that it does not fit properly and causes pain when he wears it.
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
## 2015-11-13 NOTE — ED Notes (Signed)
Pt transported to xray 

## 2015-11-17 ENCOUNTER — Encounter (HOSPITAL_COMMUNITY)
Admission: RE | Admit: 2015-11-17 | Discharge: 2015-11-17 | Disposition: A | Discharge status: Home / Self Care | Payer: Medicaid Other | Source: Ambulatory Visit | Attending: Orthopedic Surgery | Admitting: Orthopedic Surgery

## 2015-11-17 ENCOUNTER — Encounter (HOSPITAL_COMMUNITY): Payer: Self-pay

## 2015-11-17 DIAGNOSIS — Z01812 Encounter for preprocedural laboratory examination: Secondary | ICD-10-CM | POA: Insufficient documentation

## 2015-11-17 DIAGNOSIS — X58XXXA Exposure to other specified factors, initial encounter: Secondary | ICD-10-CM | POA: Diagnosis not present

## 2015-11-17 DIAGNOSIS — S82842A Displaced bimalleolar fracture of left lower leg, initial encounter for closed fracture: Secondary | ICD-10-CM | POA: Insufficient documentation

## 2015-11-17 HISTORY — DX: Sleep apnea, unspecified: G47.30

## 2015-11-17 LAB — CBC
HEMATOCRIT: 49.6 % (ref 39.0–52.0)
HEMOGLOBIN: 17.3 g/dL — AB (ref 13.0–17.0)
MCH: 32.4 pg (ref 26.0–34.0)
MCHC: 34.9 g/dL (ref 30.0–36.0)
MCV: 92.9 fL (ref 78.0–100.0)
Platelets: 287 10*3/uL (ref 150–400)
RBC: 5.34 MIL/uL (ref 4.22–5.81)
RDW: 14 % (ref 11.5–15.5)
WBC: 8.6 10*3/uL (ref 4.0–10.5)

## 2015-11-17 LAB — COMPREHENSIVE METABOLIC PANEL
ALBUMIN: 3.9 g/dL (ref 3.5–5.0)
ALK PHOS: 57 U/L (ref 38–126)
ALT: 37 U/L (ref 17–63)
ANION GAP: 7 (ref 5–15)
AST: 28 U/L (ref 15–41)
BUN: 10 mg/dL (ref 6–20)
CHLORIDE: 106 mmol/L (ref 101–111)
CO2: 23 mmol/L (ref 22–32)
Calcium: 9.3 mg/dL (ref 8.9–10.3)
Creatinine, Ser: 0.95 mg/dL (ref 0.61–1.24)
GFR calc non Af Amer: >60 mL/min (ref >60)
GLUCOSE: 110 mg/dL — AB (ref 65–99)
POTASSIUM: 4 mmol/L (ref 3.5–5.1)
SODIUM: 136 mmol/L (ref 135–145)
Total Bilirubin: 0.5 mg/dL (ref 0.3–1.2)
Total Protein: 7 g/dL (ref 6.5–8.1)

## 2015-11-17 LAB — SURGICAL PCR SCREEN
MRSA, PCR: NEGATIVE
Staphylococcus aureus: NEGATIVE

## 2015-11-17 NOTE — Pre-Procedure Instructions (Signed)
Ricky ElseDonnie R Aquilar  11/17/2015      [facility] [place] [address] [place] [id number] Phone: [phone] Fax: [phone]    Your procedure is scheduled on Aug 23.  [facility].M.  Call this number if you have problems the morning of surgery:  [phone]   Remember:  Do not eat food or drink liquids after midnight.  Take these medicines the morning of surgery with A SIP OF WATER  Gabapentin (Neurontin), Hydrocodone (norco) or Oxycodone (Percocet) if needed, venlafaxine (Effexor)  Stop taking aspirin, Ibuprofen, Advil, Motrin, Aleve, Vitamins, Herbal medications, Fish Oil   Do not wear jewelry, make-up or nail polish.  Do not wear lotions, powders, or perfumes.  You may wear deoderant.  Do not shave 48 hours prior to surgery.  Men may shave face and neck.  Do not bring valuables to the hospital.  [facility] is not responsible for any belongings or valuables.  Contacts, dentures or bridgework may not be worn into surgery.  Leave your suitcase in the car.  After surgery it may be brought to your room.  For patients admitted to the hospital, discharge time will be determined by your treatment team.  Patients discharged the day of surgery will not be allowed to drive home.   Special instructions:  [place] - Preparing for Surgery  Before surgery, you can play an important role.  Because skin is not sterile, your skin needs to be as free of germs as possible.  You can reduce the number of germs on you skin by washing with CHG (chlorahexidine gluconate) soap before surgery.  CHG is an antiseptic cleaner which kills germs and bonds with the skin to continue killing germs even after washing.  Please DO NOT use if you have an allergy to CHG or antibacterial soaps.  If your skin becomes reddened/irritated stop using the CHG and inform your nurse when you arrive at Short Stay.  Do  not shave (including legs and underarms) for at least 48 hours prior to the first CHG shower.  You may shave your face.  Please follow these instructions carefully:   1.  Shower with CHG Soap the night before surgery and the    morning of Surgery.  2.  If you choose to wash your hair, wash your hair first as usual with your  normal shampoo.  3.  After you shampoo, rinse your hair and body thoroughly to remove the  Shampoo.  4.  Use CHG as you would any other liquid soap.  You can apply chg directly  to the skin and wash gently with scrungie or a clean washcloth.  5.  Apply the CHG Soap to your body ONLY FROM THE NECK DOWN.   Do not use on open wounds or open sores.  Avoid contact with your eyes,   ears, mouth and genitals (private parts).  Wash genitals (private parts)   with your normal soap.  6.  Wash thoroughly, paying special attention to the area where your surgery  will be performed.  7.  Thoroughly rinse your body with warm water from the neck down.  8.  DO NOT shower/wash with your normal soap after using and rinsing off   the CHG Soap.  9.  Pat yourself dry with a clean towel.            10.  Wear clean pajamas.  11.  Place clean sheets on your bed the night of your first shower and do not   sleep with pets.  Day of Surgery  Do not apply any lotions/deoderants the morning of surgery.  Please wear clean clothes to the hospital/surgery center.     Please read over the following fact sheets that you were given. Pain Booklet, Coughing and Deep Breathing, MRSA Information and Surgical Site Infection Prevention

## 2015-11-17 NOTE — Progress Notes (Signed)
PCP is Dr. Midge AverPavelock Denies ever seeing a cardiologist. Denies ever having a stress test, echo, or card cath. Instructed not to smoke on the day of surgery.- voices understanding States he has mild sleep apnea, but doesn't have a cpap or bipap. Pt states he doesn't have an adult to be with him for 24 hours after surgery. His mom is at home, but no one to be here with him to hear discharge instructions and for the ride home- he uses medicaid transportation. Dr Audrie Liauda's office called and informed of above.

## 2015-11-18 ENCOUNTER — Observation Stay (HOSPITAL_COMMUNITY)
Admission: RE | Admit: 2015-11-18 | Discharge: 2015-11-18 | Disposition: A | Discharge status: Home / Self Care | Payer: Medicaid Other | Source: Ambulatory Visit | Attending: Orthopedic Surgery | Admitting: Orthopedic Surgery

## 2015-11-18 ENCOUNTER — Encounter (HOSPITAL_COMMUNITY): Payer: Self-pay | Admitting: *Deleted

## 2015-11-18 ENCOUNTER — Encounter (HOSPITAL_COMMUNITY)
Admission: RE | Disposition: A | Discharge status: Home / Self Care | Payer: Self-pay | Source: Ambulatory Visit | Attending: Orthopedic Surgery

## 2015-11-18 ENCOUNTER — Ambulatory Visit (HOSPITAL_COMMUNITY): Payer: Medicaid Other | Admitting: Anesthesiology

## 2015-11-18 ENCOUNTER — Ambulatory Visit (HOSPITAL_COMMUNITY): Payer: Medicaid Other | Admitting: Vascular Surgery

## 2015-11-18 DIAGNOSIS — F17210 Nicotine dependence, cigarettes, uncomplicated: Secondary | ICD-10-CM | POA: Diagnosis not present

## 2015-11-18 DIAGNOSIS — S82842A Displaced bimalleolar fracture of left lower leg, initial encounter for closed fracture: Secondary | ICD-10-CM | POA: Diagnosis not present

## 2015-11-18 DIAGNOSIS — Z7982 Long term (current) use of aspirin: Secondary | ICD-10-CM | POA: Diagnosis not present

## 2015-11-18 DIAGNOSIS — G4730 Sleep apnea, unspecified: Secondary | ICD-10-CM | POA: Insufficient documentation

## 2015-11-18 DIAGNOSIS — S82842S Displaced bimalleolar fracture of left lower leg, sequela: Secondary | ICD-10-CM

## 2015-11-18 DIAGNOSIS — F329 Major depressive disorder, single episode, unspecified: Secondary | ICD-10-CM | POA: Diagnosis not present

## 2015-11-18 DIAGNOSIS — X58XXXA Exposure to other specified factors, initial encounter: Secondary | ICD-10-CM | POA: Diagnosis not present

## 2015-11-18 DIAGNOSIS — Z79899 Other long term (current) drug therapy: Secondary | ICD-10-CM | POA: Diagnosis not present

## 2015-11-18 DIAGNOSIS — S82843A Displaced bimalleolar fracture of unspecified lower leg, initial encounter for closed fracture: Secondary | ICD-10-CM | POA: Diagnosis present

## 2015-11-18 HISTORY — PX: ORIF ANKLE FRACTURE: SHX5408

## 2015-11-18 SURGERY — OPEN REDUCTION INTERNAL FIXATION (ORIF) ANKLE FRACTURE
Anesthesia: General | Laterality: Left

## 2015-11-18 MED ORDER — METHOCARBAMOL 1000 MG/10ML IJ SOLN
500.0000 mg | Freq: Four times a day (QID) | INTRAVENOUS | Status: DC | PRN
  Filled 2015-11-18: qty 5

## 2015-11-18 MED ORDER — ONDANSETRON HCL 4 MG/2ML IJ SOLN
INTRAMUSCULAR | Status: DC | PRN
  Administered 2015-11-18: 4 mg via INTRAVENOUS

## 2015-11-18 MED ORDER — LIDOCAINE HCL (CARDIAC) 20 MG/ML IV SOLN
INTRAVENOUS | Status: DC | PRN
  Administered 2015-11-18: 60 mg via INTRAVENOUS

## 2015-11-18 MED ORDER — METHOCARBAMOL 500 MG PO TABS: 500.0000 mg | ORAL_TABLET | Freq: Four times a day (QID) | ORAL | Status: DC | PRN

## 2015-11-18 MED ORDER — METOCLOPRAMIDE HCL 5 MG PO TABS: 5.0000 mg | ORAL_TABLET | Freq: Three times a day (TID) | ORAL | Status: DC | PRN

## 2015-11-18 MED ORDER — CHLORHEXIDINE GLUCONATE 4 % EX LIQD: 60.0000 mL | Freq: Once | CUTANEOUS | Status: DC

## 2015-11-18 MED ORDER — HYDROMORPHONE HCL 1 MG/ML IJ SOLN: 0.2500 mg | INTRAMUSCULAR | Status: DC | PRN

## 2015-11-18 MED ORDER — PROPOFOL 10 MG/ML IV BOLUS
INTRAVENOUS | Status: DC | PRN
  Administered 2015-11-18: 200 mg via INTRAVENOUS

## 2015-11-18 MED ORDER — CEFAZOLIN IN D5W 1 GM/50ML IV SOLN
1.0000 g | Freq: Four times a day (QID) | INTRAVENOUS | Status: DC
  Filled 2015-11-18 (×3): qty 50

## 2015-11-18 MED ORDER — SODIUM CHLORIDE 0.9 % IV SOLN: INTRAVENOUS | Status: DC

## 2015-11-18 MED ORDER — FENTANYL CITRATE (PF) 100 MCG/2ML IJ SOLN
INTRAMUSCULAR | Status: DC | PRN
  Administered 2015-11-18 (×8): 25 ug via INTRAVENOUS

## 2015-11-18 MED ORDER — LACTATED RINGERS IV SOLN: INTRAVENOUS | Status: DC

## 2015-11-18 MED ORDER — LACTATED RINGERS IV SOLN
INTRAVENOUS | Status: DC
  Administered 2015-11-18 (×2): via INTRAVENOUS

## 2015-11-18 MED ORDER — PROPOFOL 10 MG/ML IV BOLUS
INTRAVENOUS | Status: AC
  Filled 2015-11-18: qty 40

## 2015-11-18 MED ORDER — ACETAMINOPHEN 650 MG RE SUPP
650.0000 mg | Freq: Four times a day (QID) | RECTAL | Status: DC | PRN
Start: 2015-11-18 — End: 2015-11-18

## 2015-11-18 MED ORDER — HYDROMORPHONE HCL 1 MG/ML IJ SOLN: 1.0000 mg | INTRAMUSCULAR | Status: DC | PRN

## 2015-11-18 MED ORDER — ASPIRIN EC 325 MG PO TBEC: 1300.0000 mg | DELAYED_RELEASE_TABLET | Freq: Four times a day (QID) | ORAL | Status: DC | PRN

## 2015-11-18 MED ORDER — OXYCODONE HCL 5 MG PO TABS: 5.0000 mg | ORAL_TABLET | ORAL | Status: DC | PRN

## 2015-11-18 MED ORDER — CEFAZOLIN SODIUM-DEXTROSE 2-4 GM/100ML-% IV SOLN
2.0000 g | INTRAVENOUS | Status: AC
  Administered 2015-11-18: 2 g via INTRAVENOUS
  Filled 2015-11-18: qty 100

## 2015-11-18 MED ORDER — MIDAZOLAM HCL 2 MG/2ML IJ SOLN
INTRAMUSCULAR | Status: AC
  Filled 2015-11-18: qty 2

## 2015-11-18 MED ORDER — VENLAFAXINE HCL ER 75 MG PO CP24: 75.0000 mg | ORAL_CAPSULE | Freq: Every day | ORAL | Status: DC

## 2015-11-18 MED ORDER — METOCLOPRAMIDE HCL 5 MG/ML IJ SOLN: 5.0000 mg | Freq: Three times a day (TID) | INTRAMUSCULAR | Status: DC | PRN

## 2015-11-18 MED ORDER — PROMETHAZINE HCL 25 MG/ML IJ SOLN: 6.2500 mg | INTRAMUSCULAR | Status: DC | PRN

## 2015-11-18 MED ORDER — BUPIVACAINE-EPINEPHRINE (PF) 0.5% -1:200000 IJ SOLN
INTRAMUSCULAR | Status: DC | PRN
  Administered 2015-11-18: 50 mL via PERINEURAL

## 2015-11-18 MED ORDER — MIDAZOLAM HCL 5 MG/5ML IJ SOLN
INTRAMUSCULAR | Status: DC | PRN
  Administered 2015-11-18: 2 mg via INTRAVENOUS

## 2015-11-18 MED ORDER — ONDANSETRON HCL 4 MG/2ML IJ SOLN: 4.0000 mg | Freq: Four times a day (QID) | INTRAMUSCULAR | Status: DC | PRN

## 2015-11-18 MED ORDER — ACETAMINOPHEN 325 MG PO TABS: 650.0000 mg | ORAL_TABLET | Freq: Four times a day (QID) | ORAL | Status: DC | PRN

## 2015-11-18 MED ORDER — FENTANYL CITRATE (PF) 100 MCG/2ML IJ SOLN
INTRAMUSCULAR | Status: AC
  Filled 2015-11-18: qty 4

## 2015-11-18 MED ORDER — ONDANSETRON HCL 4 MG PO TABS: 4.0000 mg | ORAL_TABLET | Freq: Four times a day (QID) | ORAL | Status: DC | PRN

## 2015-11-18 MED ORDER — MEPERIDINE HCL 25 MG/ML IJ SOLN: 6.2500 mg | INTRAMUSCULAR | Status: DC | PRN

## 2015-11-18 SURGICAL SUPPLY — 60 items
BANDAGE ESMARK 6X9 LF (GAUZE/BANDAGES/DRESSINGS) IMPLANT
BIT DRILL 2.5X110 QC LCP DISP (BIT) ×3 IMPLANT
BNDG CMPR 9X6 STRL LF SNTH (GAUZE/BANDAGES/DRESSINGS)
BNDG COHESIVE 4X5 TAN STRL (GAUZE/BANDAGES/DRESSINGS) ×3 IMPLANT
BNDG ESMARK 6X9 LF (GAUZE/BANDAGES/DRESSINGS)
BNDG GAUZE ELAST 4 BULKY (GAUZE/BANDAGES/DRESSINGS) ×5 IMPLANT
COVER SURGICAL LIGHT HANDLE (MISCELLANEOUS) ×6 IMPLANT
CUFF TOURNIQUET SINGLE 34IN LL (TOURNIQUET CUFF) IMPLANT
CUFF TOURNIQUET SINGLE 44IN (TOURNIQUET CUFF) IMPLANT
DRAPE INCISE IOBAN 66X45 STRL (DRAPES) ×3 IMPLANT
DRAPE OEC MINIVIEW 54X84 (DRAPES) ×2 IMPLANT
DRAPE PROXIMA HALF (DRAPES) ×3 IMPLANT
DRAPE U-SHAPE 47X51 STRL (DRAPES) ×3 IMPLANT
DRSG ADAPTIC 3X8 NADH LF (GAUZE/BANDAGES/DRESSINGS) ×3 IMPLANT
DRSG PAD ABDOMINAL 8X10 ST (GAUZE/BANDAGES/DRESSINGS) ×6 IMPLANT
DURAPREP 26ML APPLICATOR (WOUND CARE) ×3 IMPLANT
ELECT REM PT RETURN 9FT ADLT (ELECTROSURGICAL) ×3
ELECTRODE REM PT RTRN 9FT ADLT (ELECTROSURGICAL) ×1 IMPLANT
GAUZE SPONGE 4X4 12PLY STRL (GAUZE/BANDAGES/DRESSINGS) ×3 IMPLANT
GLOVE BIO SURGEON STRL SZ 6.5 (GLOVE) ×1 IMPLANT
GLOVE BIO SURGEONS STRL SZ 6.5 (GLOVE) ×1
GLOVE BIOGEL PI IND STRL 6 (GLOVE) IMPLANT
GLOVE BIOGEL PI IND STRL 7.5 (GLOVE) IMPLANT
GLOVE BIOGEL PI IND STRL 9 (GLOVE) ×1 IMPLANT
GLOVE BIOGEL PI INDICATOR 6 (GLOVE) ×2
GLOVE BIOGEL PI INDICATOR 7.5 (GLOVE) ×2
GLOVE BIOGEL PI INDICATOR 9 (GLOVE) ×2
GLOVE SURG ORTHO 9.0 STRL STRW (GLOVE) ×3 IMPLANT
GLOVE SURG SS PI 6.0 STRL IVOR (GLOVE) ×2 IMPLANT
GOWN STRL REUS W/ TWL LRG LVL3 (GOWN DISPOSABLE) ×2 IMPLANT
GOWN STRL REUS W/ TWL XL LVL3 (GOWN DISPOSABLE) ×3 IMPLANT
GOWN STRL REUS W/TWL LRG LVL3 (GOWN DISPOSABLE) ×6
GOWN STRL REUS W/TWL XL LVL3 (GOWN DISPOSABLE) ×9
GUIDEWIRE THREADED 150MM (WIRE) ×3 IMPLANT
KIT BASIN OR (CUSTOM PROCEDURE TRAY) ×3 IMPLANT
KIT ROOM TURNOVER OR (KITS) ×3 IMPLANT
MANIFOLD NEPTUNE II (INSTRUMENTS) IMPLANT
NS IRRIG 1000ML POUR BTL (IV SOLUTION) ×3 IMPLANT
PACK ORTHO EXTREMITY (CUSTOM PROCEDURE TRAY) ×3 IMPLANT
PAD ARMBOARD 7.5X6 YLW CONV (MISCELLANEOUS) ×6 IMPLANT
PLATE LCP 3.5 1/3 TUB 7HX81 (Plate) ×2 IMPLANT
SCREW CANC FT ST SFS 4X14 (Screw) ×6 IMPLANT
SCREW CORTEX 3.5 12MM (Screw) ×4 IMPLANT
SCREW CORTEX 3.5 14MM (Screw) ×2 IMPLANT
SCREW CORTEX 3.5 26MM (Screw) ×2 IMPLANT
SCREW LOCK CORT ST 3.5X12 (Screw) IMPLANT
SCREW LOCK CORT ST 3.5X14 (Screw) ×1 IMPLANT
SCREW LOCK CORT ST 3.5X26 (Screw) IMPLANT
SCREW SHORT THREAD 4.0X40 (Screw) ×2 IMPLANT
SPONGE LAP 18X18 X RAY DECT (DISPOSABLE) ×3 IMPLANT
STAPLER VISISTAT 35W (STAPLE) ×2 IMPLANT
SUCTION FRAZIER HANDLE 10FR (MISCELLANEOUS) ×2
SUCTION TUBE FRAZIER 10FR DISP (MISCELLANEOUS) ×1 IMPLANT
SUT ETHILON 2 0 PSLX (SUTURE) IMPLANT
SUT VIC AB 2-0 CT1 27 (SUTURE) ×6
SUT VIC AB 2-0 CT1 TAPERPNT 27 (SUTURE) ×2 IMPLANT
TOWEL OR 17X24 6PK STRL BLUE (TOWEL DISPOSABLE) ×3 IMPLANT
TOWEL OR 17X26 10 PK STRL BLUE (TOWEL DISPOSABLE) ×3 IMPLANT
TUBE CONNECTING 12'X1/4 (SUCTIONS) ×1
TUBE CONNECTING 12X1/4 (SUCTIONS) ×2 IMPLANT

## 2015-11-18 NOTE — Anesthesia Procedure Notes (Signed)
Procedure Name: LMA Insertion Date/Time: 11/18/2015 9:19 AM Performed by: Edmonia CaprioAUSTON, Brettany Sydney M Pre-anesthesia Checklist: Patient identified, Emergency Drugs available, Suction available, Patient being monitored and Timeout performed Patient Re-evaluated:Patient Re-evaluated prior to inductionOxygen Delivery Method: Circle system utilized Preoxygenation: Pre-oxygenation with 100% oxygen Intubation Type: IV induction Ventilation: Mask ventilation without difficulty LMA: LMA inserted LMA Size: 5.0 Tube type: Oral Placement Confirmation: breath sounds checked- equal and bilateral and positive ETCO2 Tube secured with: Tape Dental Injury: Teeth and Oropharynx as per pre-operative assessment

## 2015-11-18 NOTE — Anesthesia Preprocedure Evaluation (Addendum)
Anesthesia Evaluation  Patient identified by MRN, date of birth, ID band Patient awake    Reviewed: Allergy & Precautions, NPO status , Patient's Chart, lab work & pertinent test results  Airway Mallampati: I  TM Distance: >3 FB Neck ROM: Full    Dental  (+) Teeth Intact, Dental Advisory Given   Pulmonary sleep apnea and Continuous Positive Airway Pressure Ventilation , Current Smoker,    breath sounds clear to auscultation       Cardiovascular negative cardio ROS   Rhythm:Regular Rate:Normal     Neuro/Psych PSYCHIATRIC DISORDERS Anxiety Depression negative neurological ROS     GI/Hepatic negative GI ROS, Neg liver ROS,   Endo/Other  negative endocrine ROS  Renal/GU negative Renal ROS  negative genitourinary   Musculoskeletal negative musculoskeletal ROS (+)   Abdominal   Peds negative pediatric ROS (+)  Hematology negative hematology ROS (+)   Anesthesia Other Findings   Reproductive/Obstetrics negative OB ROS                            Lab Results  Component Value Date   WBC 8.6 11/17/2015   HGB 17.3 (H) 11/17/2015   HCT 49.6 11/17/2015   MCV 92.9 11/17/2015   PLT 287 11/17/2015   No results found for: INR, PROTIME  12/2014 EKG: normal sinus rhythm.   Anesthesia Physical Anesthesia Plan  ASA: II  Anesthesia Plan: General   Post-op Pain Management: GA combined w/ Regional for post-op pain   Induction: Intravenous  Airway Management Planned: LMA  Additional Equipment:   Intra-op Plan:   Post-operative Plan: Extubation in OR  Informed Consent: I have reviewed the patients History and Physical, chart, labs and discussed the procedure including the risks, benefits and alternatives for the proposed anesthesia with the patient or authorized representative who has indicated his/her understanding and acceptance.   Dental advisory given  Plan Discussed with:  CRNA  Anesthesia Plan Comments:         Anesthesia Quick Evaluation

## 2015-11-18 NOTE — Anesthesia Procedure Notes (Signed)
Anesthesia Regional Block:  Popliteal block  Pre-Anesthetic Checklist: ,, timeout performed, Correct Patient, Correct Site, Correct Laterality, Correct Procedure, Correct Position, site marked, Risks and benefits discussed,  Surgical consent,  Pre-op evaluation,  At surgeon's request and post-op pain management  Laterality: Left  Prep: chloraprep       Needles:  Injection technique: Single-shot  Needle Type: Echogenic Needle     Needle Length: 9cm 9 cm Needle Gauge: 21 and 21 G    Additional Needles:  Procedures: ultrasound guided (picture in chart) Popliteal block Narrative:  Start time: 11/18/2015 9:00 AM End time: 11/18/2015 9:05 AM Injection made incrementally with aspirations every 5 mL.  Performed by: Personally  Anesthesiologist: Shona SimpsonHOLLIS, KEVIN D  Additional Notes: Pt tolerated well. No immediate complications noted.

## 2015-11-18 NOTE — H&P (Signed)
Ricky Wu is an 30 y.o. male.   Chief Complaint: Unstable left ankle fracture. HPI: Ricky Wu is a 30 year old gentleman with bimalleolar left ankle fracture. Ricky Wu presents at this time for open reduction internal fixation.  Past Medical History:  Diagnosis Date  . ADHD (attention deficit hyperactivity disorder)   . Anhedonia   . Anxiety   . Anxiety disorder   . Confusion   . Depression   . Disorganized thought process   . Penis disorder   . Sleep apnea    mild no cpap or bipap    Past Surgical History:  Procedure Laterality Date  . HAND SURGERY      Family History  Problem Relation Age of Onset  . Hypertension Mother   . Diabetes Mother   . Cancer Father   . Arthritis Father   . Diabetes Other    Social History:  reports that he has been smoking Cigarettes.  He has a 21.00 pack-year smoking history. He has never used smokeless tobacco. He reports that he drinks alcohol. He reports that he does not use drugs.  Allergies:  Allergies  Allergen Reactions  . Adderall [Amphetamine-Dextroamphetamine] Other (See Comments)    Accidental overdose    No prescriptions prior to admission.    Results for orders placed or performed during the hospital encounter of 11/17/15 (from the past 48 hour(s))  Comprehensive metabolic panel     Status: Abnormal   Collection Time: 11/17/15  4:01 PM  Result Value Ref Range   Sodium 136 135 - 145 mmol/L   Potassium 4.0 3.5 - 5.1 mmol/L   Chloride 106 101 - 111 mmol/L   CO2 23 22 - 32 mmol/L   Glucose, Bld 110 (H) 65 - 99 mg/dL   BUN 10 6 - 20 mg/dL   Creatinine, Ser 0.95 0.61 - 1.24 mg/dL   Calcium 9.3 8.9 - 10.3 mg/dL   Total Protein 7.0 6.5 - 8.1 g/dL   Albumin 3.9 3.5 - 5.0 g/dL   AST 28 15 - 41 U/L   ALT 37 17 - 63 U/L   Alkaline Phosphatase 57 38 - 126 U/L   Total Bilirubin 0.5 0.3 - 1.2 mg/dL   GFR calc non Af Amer >60 >60 mL/min   GFR calc Af Amer >60 >60 mL/min    Comment: (NOTE) The eGFR has been calculated using  the CKD EPI equation. This calculation has not been validated in all clinical situations. eGFR's persistently <60 mL/min signify possible Chronic Kidney Disease.    Anion gap 7 5 - 15  CBC     Status: Abnormal   Collection Time: 11/17/15  4:01 PM  Result Value Ref Range   WBC 8.6 4.0 - 10.5 K/uL   RBC 5.34 4.22 - 5.81 MIL/uL   Hemoglobin 17.3 (H) 13.0 - 17.0 g/dL   HCT 49.6 39.0 - 52.0 %   MCV 92.9 78.0 - 100.0 fL   MCH 32.4 26.0 - 34.0 pg   MCHC 34.9 30.0 - 36.0 g/dL   RDW 14.0 11.5 - 15.5 %   Platelets 287 150 - 400 K/uL  Surgical pcr screen     Status: None   Collection Time: 11/17/15  4:09 PM  Result Value Ref Range   MRSA, PCR NEGATIVE NEGATIVE   Staphylococcus aureus NEGATIVE NEGATIVE    Comment:        The Xpert SA Assay (FDA approved for NASAL specimens in patients over 53 years of age), is one component of a  comprehensive surveillance program.  Test performance has been validated by [facility] for patients greater than or equal to 24 year old. It is not intended to diagnose infection nor to guide or monitor treatment.    No results found.  Review of Systems  All other systems reviewed and are negative.   There were no vitals taken for this visit. Physical Exam  On examination Ricky Wu has a palpable dorsalis pedis pulse. Radiographs shows an unstable displaced bimalleolar left ankle fracture. Assessment/Plan Assessment: Bimalleolar left ankle fracture.  Plan: We'll plan for open reduction internal fixation of the left ankle medial and lateral malleolus. Risk and benefits were discussed including infection neurovascular injury persistent pain and need for additional surgery. Ricky Wu states he understands wishes to proceed at this time.  Newt Minion, MD 11/18/2015, 6:26 AM

## 2015-11-18 NOTE — Transfer of Care (Signed)
Immediate Anesthesia Transfer of Care Note  Patient: Ricky Wu  Procedure(s) Performed: Procedure(s): OPEN REDUCTION INTERNAL FIXATION (ORIF) LEFT ANKLE FRACTURE (Left)  Patient Location: PACU  Anesthesia Type:General  Level of Consciousness: awake  Airway & Oxygen Therapy: Patient Spontanous Breathing and Patient connected to nasal cannula oxygen  Post-op Assessment: Report given to RN and Post -op Vital signs reviewed and stable  Post vital signs: Reviewed and stable  Last Vitals:  Vitals:   11/18/15 0745  BP: 120/88  Pulse: 64  Resp: 16  Temp: 36.8 C    Last Pain:  Vitals:   11/18/15 0745  TempSrc: Oral         Complications: No apparent anesthesia complications

## 2015-11-18 NOTE — Anesthesia Postprocedure Evaluation (Signed)
Anesthesia Post Note  Patient: Ricky Wu  Procedure(s) Performed: Procedure(s) (LRB): OPEN REDUCTION INTERNAL FIXATION (ORIF) LEFT ANKLE FRACTURE (Left)  Patient location during evaluation: PACU Anesthesia Type: General and Regional Level of consciousness: awake and alert Pain management: pain level controlled Vital Signs Assessment: post-procedure vital signs reviewed and stable Respiratory status: spontaneous breathing, nonlabored ventilation, respiratory function stable and patient connected to nasal cannula oxygen Cardiovascular status: blood pressure returned to baseline and stable Postop Assessment: no signs of nausea or vomiting Anesthetic complications: no    Last Vitals:  Vitals:   11/18/15 1100 11/18/15 1150  BP: 131/85 134/90  Pulse: 93 84  Resp: 15 16  Temp:  36.6 C    Last Pain:  Vitals:   11/18/15 1045  TempSrc:   PainSc: 0-No pain                 Shelton SilvasKevin D Chai Routh

## 2015-11-18 NOTE — Evaluation (Signed)
Physical Therapy Evaluation/ Discharge Patient Details Name: Ricky Wu MRN: [id number] DOB: [birth year] Today's Date: 11/18/2015   History of Present Illness  30yo admitted for left ankle ORIF. PMHx: ADHD, depression, sleep apnea  Clinical Impression  Pt very pleasant and moving well. Pt has been utilizing crutches and NWB for several days and verbalized sequence for use of shower chair and maintaining NWB status. Pt educated for proper transition to and from crutches with sit<>stand as well as weight distribution during gait. Pt able to demonstrate understanding. Pt with CAM boot in room but unable to don due to width of post operative dressing and RN made aware. Pt able to complete all basic transfers and gait without stairs for home. All education complete without further needs, pt aware and agreeable will sign off.     Follow Up Recommendations No PT follow up    Equipment Recommendations  None recommended by PT    Recommendations for Other Services       Precautions / Restrictions Precautions Precautions: Fall Required Braces or Orthoses: Other Brace/Splint Other Brace/Splint: post op boot- currently does not fit due to post operative dressing Restrictions Weight Bearing Restrictions: Yes LLE Weight Bearing: Non weight bearing      Mobility  Bed Mobility Overal bed mobility: Modified Independent                Transfers Overall transfer level: Needs assistance   Transfers: Sit to/from Stand Sit to Stand: Supervision         General transfer comment: cues for hand and crutch placement for safety  Ambulation/Gait Ambulation/Gait assistance: Modified independent (Device/Increase time) Ambulation Distance (Feet): 300 Feet Assistive device: Crutches Gait Pattern/deviations: Step-to pattern   Gait velocity interpretation: at or above normal speed for age/gender General Gait Details: pt utilizing crutches well and maintaining NWB status  throughout  Stairs            Wheelchair Mobility    Modified Rankin (Stroke Patients Only)       Balance                                             Pertinent Vitals/Pain Pain Assessment: No/denies pain    Home Living Family/patient expects to be discharged to:: Private residence Living Arrangements: Parent Available Help at Discharge: Family;Available 24 hours/day Type of Home: Apartment Home Access: Level entry     Home Layout: One level Home Equipment: Crutches;Shower seat      Prior Function Level of Independence: Independent with assistive device(s)               Hand Dominance        Extremity/Trunk Assessment   Upper Extremity Assessment: Overall WFL for tasks assessed           Lower Extremity Assessment: Overall WFL for tasks assessed         Communication   Communication: No difficulties  Cognition Arousal/Alertness: Awake/alert Behavior During Therapy: WFL for tasks assessed/performed Overall Cognitive Status: Within Functional Limits for tasks assessed                      General Comments      Exercises        Assessment/Plan    PT Assessment Patent does not need any further PT services  PT Diagnosis Abnormality of gait   PT  Problem List    PT Treatment Interventions     PT Goals (Current goals can be found in the Care Plan section) Acute Rehab PT Goals PT Goal Formulation: All assessment and education complete, DC therapy    Frequency     Barriers to discharge        Co-evaluation               End of Session Equipment Utilized During Treatment: Gait belt Activity Tolerance: Patient tolerated treatment well Patient left: in chair;with call bell/phone within reach Nurse Communication: Mobility status;Precautions;Weight bearing status    Functional Assessment Tool Used: clinical judgement Functional Limitation: Mobility: Walking and moving around Mobility: Walking and  Moving Around Current Status ([id number](G8978): At least 1 percent but less than 20 percent impaired, limited or restricted Mobility: Walking and Moving Around Goal Status [phone](G8979): At least 1 percent but less than 20 percent impaired, limited or restricted Mobility: Walking and Moving Around Discharge Status [phone](G8980): At least 1 percent but less than 20 percent impaired, limited or restricted    Time: [id number] PT Time Calculation (min) (ACUTE ONLY): 18 min   Charges:   PT Evaluation $PT Eval Low Complexity: 1 Procedure     PT G Codes:   PT G-Codes **NOT FOR INPATIENT CLASS** Functional Assessment Tool Used: clinical judgement Functional Limitation: Mobility: Walking and moving around Mobility: Walking and Moving Around Current Status ([id number](G8978): At least 1 percent but less than 20 percent impaired, limited or restricted Mobility: Walking and Moving Around Goal Status [phone](G8979): At least 1 percent but less than 20 percent impaired, limited or restricted Mobility: Walking and Moving Around Discharge Status [phone](G8980): At least 1 percent but less than 20 percent impaired, limited or restricted    Delorse Lekabor, Quasim Doyon Beth 11/18/2015, 1:46 PM Delaney MeigsMaija Tabor Gerald Kuehl, PT [phone]

## 2015-11-18 NOTE — Anesthesia Procedure Notes (Signed)
Anesthesia Regional Block:  Adductor canal block  Pre-Anesthetic Checklist: ,, timeout performed, Correct Patient, Correct Site, Correct Laterality, Correct Procedure, Correct Position, site marked, Risks and benefits discussed,  Surgical consent,  Pre-op evaluation,  At surgeon's request and post-op pain management  Laterality: Left  Prep: chloraprep       Needles:  Injection technique: Single-shot  Needle Type: Echogenic Needle     Needle Length: 9cm 9 cm Needle Gauge: 21 and 21 G    Additional Needles:  Procedures: ultrasound guided (picture in chart) Adductor canal block Narrative:  Start time: 11/18/2015 9:05 AM End time: 11/18/2015 9:08 AM Injection made incrementally with aspirations every 5 mL.  Performed by: Personally  Anesthesiologist: Shona SimpsonHOLLIS, KEVIN D  Additional Notes: No immediate complications noted. Pt tolerated well.

## 2015-11-18 NOTE — Op Note (Signed)
11/18/2015  10:00 AM  PATIENT:  Ricky Wu    PRE-OPERATIVE DIAGNOSIS:  Bimalleolar Left Ankle Fracture  POST-OPERATIVE DIAGNOSIS:  Same  PROCEDURE:  OPEN REDUCTION INTERNAL FIXATION (ORIF) LEFT ANKLE FRACTURE  SURGEON:  Nadara MustardUDA,Jasen Hartstein V, MD  PHYSICIAN ASSISTANT:None ANESTHESIA:   General  PREOPERATIVE INDICATIONS:  Ricky Wu is a  30 y.o. male with a diagnosis of Bimalleolar Left Ankle Fracture who failed conservative measures and elected for surgical management.    The risks benefits and alternatives were discussed with the patient preoperatively including but not limited to the risks of infection, bleeding, nerve injury, cardiopulmonary complications, the need for revision surgery, among others, and the patient was willing to proceed.  OPERATIVE IMPLANTS: 7-hole one third tubular plate laterally with interfrag screw and 5 compression screws. Medially one compression screw.  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  OPERATIVE PROCEDURE: Patient is a 30 year old gentleman presents with a bimalleolar ankle fracture. Patient has started to develop a fibrous scar tissue at the fracture sites and presents at this time for takedown of the malunion and open reduction internal fixation of the bimalleolar left ankle fracture. Risk and benefits were discussed including arthritis infection DVT. Patient states he understands wishes to proceed at this time. Patient brought the operating room after undergoing a popliteal block he then underwent a general anesthetic. After adequate levels anesthesia obtained patient's left lower extremity was prepped using DuraPrep draped into a sterile field a timeout was called. A lateral incision was made over the fibula this was carried sharply down to bone. Subperiosteal dissection was performed and the fracture site was cleansed of fibrous tissue. This was reduced and then compressed with an interfrag screw 26 m in length. A  anti-glide lateral plate was then applied secured proximally with 3 compression screws distally with 2 compression screws. Wound was irrigated normal saline subcutaneous is closed using 0 Vicryl and staples were used to close the skin. A medial incision was made over the medial malleolus longitudinally. Blunt dissection was carried down to the fracture site. Subperiosteal dissection was used to cleanse the fracture site the fracture was reduced and then stabilized with a cancellus 40 mm screw. The wound was irrigated with normal saline subcutaneous is closed using 2-0 Vicryl skin was closed using staples a sterile compressive dressing was applied patient was extubated taken to the PACU in stable condition.

## 2015-11-24 ENCOUNTER — Encounter (HOSPITAL_COMMUNITY): Payer: Self-pay | Admitting: Orthopedic Surgery

## 2015-12-08 ENCOUNTER — Ambulatory Visit: Payer: Medicaid Other

## 2015-12-15 ENCOUNTER — Ambulatory Visit: Payer: Medicaid Other

## 2015-12-21 ENCOUNTER — Ambulatory Visit: Payer: Medicaid Other | Attending: Orthopedic Surgery

## 2015-12-21 DIAGNOSIS — M6281 Muscle weakness (generalized): Secondary | ICD-10-CM | POA: Insufficient documentation

## 2015-12-21 DIAGNOSIS — S82842A Displaced bimalleolar fracture of left lower leg, initial encounter for closed fracture: Secondary | ICD-10-CM

## 2015-12-21 DIAGNOSIS — M25672 Stiffness of left ankle, not elsewhere classified: Secondary | ICD-10-CM | POA: Insufficient documentation

## 2015-12-21 DIAGNOSIS — R262 Difficulty in walking, not elsewhere classified: Secondary | ICD-10-CM | POA: Diagnosis present

## 2015-12-21 DIAGNOSIS — X58XXXA Exposure to other specified factors, initial encounter: Secondary | ICD-10-CM | POA: Insufficient documentation

## 2015-12-21 DIAGNOSIS — R600 Localized edema: Secondary | ICD-10-CM

## 2015-12-21 NOTE — Therapy (Signed)
[facility] [address] [place], [place], [address] Phone: [phone]   Fax:  [phone]  Physical Therapy Evaluation  Patient Details  Name: Ricky Wu MRN: [id number] Date of Birth: 19-Aug-1985 Referring Provider: Aldean Baker, MD  Encounter Date: 12/21/2015      PT End of Session - 12/21/15 1505    Visit Number 1   Number of Visits 4   Date for PT Re-Evaluation 02/15/16   Authorization Type Medicaid   PT Start Time 0300   PT Stop Time 0345   PT Time Calculation (min) 45 min   Activity Tolerance Patient tolerated treatment well   Behavior During Therapy [facility] for tasks assessed/performed      Past Medical History:  Diagnosis Date  . ADHD (attention deficit hyperactivity disorder)   . Anhedonia   . Anxiety   . Anxiety disorder   . Confusion   . Depression   . Disorganized thought process   . Penis disorder   . Sleep apnea    mild no cpap or bipap    Past Surgical History:  Procedure Laterality Date  . HAND SURGERY    . ORIF ANKLE FRACTURE Left 11/18/2015   Procedure: OPEN REDUCTION INTERNAL FIXATION (ORIF) LEFT ANKLE FRACTURE;  Surgeon: Nadara Mustard, MD;  Location: [facility] OR;  Service: Orthopedics;  Laterality: Left;    There were no vitals filed for this visit.       Subjective Assessment - 12/21/15 1506    Subjective He reports fell on tree root and fractured ankle with twist. Post ORIF  medial and lateral ankle. Since surgery ( 11/18/15)  and was in CAM walker until stopped wearing this  and he has been walking with some pressure on foot  with less pain. He saw Dr Lajoyce Corners and said it was OK to put some weight on ;leg and use crutches  for 2 more weeks.  He is worried about stiffness.    Limitations Standing;Walking;House hold activities   Diagnostic tests Xrays : good alighnment per MD   Patient Stated Goals Get full motion of Lt ankle .    Currently in Pain? No/denies  He has pain with weight bearing    Pain Score --  mild to modrate   Pain Location Ankle   Pain Orientation Left   Pain Descriptors / Indicators Tightness   Pain Type Surgical pain   Pain Onset More than a month ago   Pain Frequency Intermittent   Aggravating Factors  weight bearing   Pain Relieving Factors None weight bearing   Multiple Pain Sites No            [facility] PT Assessment - 12/21/15 1454      Assessment   Medical Diagnosis ORIF RT ankle fracture   Referring Provider Aldean Baker, MD   Onset Date/Surgical Date 11/18/15     Precautions   Precaution Comments WBAT with crutches for at least 2 more weeks per pt.      Restrictions   Weight Bearing Restrictions Yes   LLE Weight Bearing Weight bearing as tolerated  with crutches     Balance Screen   Has the patient fallen in the past 6 months Yes   How many times? 1   Has the patient had a decrease in activity level because of a fear of falling?  Yes  due to ORIF LT ankle   Is the patient reluctant to leave their home because of a fear of falling?  No     Prior Function   Level of Independence Requires assistive device for independence   Vocation Unemployed     Cognition   Overall Cognitive Status Within Functional Limits for tasks assessed     Observation/Other Assessments-Edema    Edema Figure 8     Figure 8 Edema   Figure 8 - Right  57.5 cm   Figure 8 - Left  59 cm     ROM / Strength   AROM / PROM / Strength AROM;Strength;PROM     AROM   AROM Assessment Site Ankle   Right/Left Ankle Right;Left   Right Ankle Dorsiflexion 100   Right Ankle Plantar Flexion 60   Right Ankle Inversion 40   Right Ankle Eversion 30   Left Ankle Dorsiflexion 93   Left Ankle Plantar Flexion 40   Left Ankle Inversion 25   Left Ankle Eversion 20     PROM   Right/Left Ankle Right   Right Ankle Dorsiflexion 95   Right Ankle Plantar Flexion 50   Right Ankle Inversion 33   Right Ankle Eversion 32     Strength   Overall Strength Comments 5/5 all motions  though Pf measured in supine not weightbearing.                            PT Education - 12/21/15 1557    Education provided Yes   Education Details POC , Medicaid limitations, HEP   Person(s) Educated Patient   Methods Explanation;Demonstration;Tactile cues;Verbal cues;Handout   Comprehension Returned demonstration;Verbalized understanding             PT Long Term Goals - 12/21/15 1456      PT LONG TERM GOAL #1   Title He will be independent with all HEP issued    Time 8   Period Weeks   Status New     PT LONG TERM GOAL #2   Title He will be able to walk communitiy distances with unilateral device WBAT   Time 8   Period Weeks   Status New     PT LONG TERM GOAL #3   Title His ROM with improve 5-15 degrees all planes to improve ambulation  with out pain.    Time 8   Period Weeks   Status New     PT LONG TERM GOAL #4   Title He will be able to stand Lt leg when allowed full weight bearing x 10 sec or more to demo improed functional balance.    Time 8   Period Weeks   Status New               Plan - 12/21/15 1558    Clinical Impression Statement Ricky Wu presents for low complexity evaluation post ORIF for bimalleolar  LT ankle fractures(S82.6 and S82.5). He demos  edema , decreased ROM , difficulty walking and restriction to WBAT with crutches. . He has been stretching .  He has some housing and relationship issues with his mother and is in need of alternative housing arraingments and report he has inquired but that available housing is limited and he will have to wait.    Rehab Potential Good   PT Frequency 1x / week   PT Duration --  every 2 weeks for 6 weeks 4 total visits includes eval   PT Treatment/Interventions Cryotherapy;Stair training;Functional mobility training;Patient/family education;Passive range of motion;Manual techniques;Taping;Balance training;Therapeutic exercise;Gait training   PT  Next Visit Plan REview HEP, add band  exercises,  ROM /mobs, cold   Hand out social service handout / info places to call for  help for housing.    PT Home Exercise Plan ROM all planes   Consulted and Agree with Plan of Care Patient      Patient will benefit from skilled therapeutic intervention in order to improve the following deficits and impairments:  Decreased strength, Pain, Decreased activity tolerance, Difficulty walking, Decreased range of motion  Visit Diagnosis: Ankle fracture, bimalleolar, closed, left, initial encounter - Plan: PT plan of care cert/re-cert  Stiffness of left ankle, not elsewhere classified - Plan: PT plan of care cert/re-cert  Localized edema - Plan: PT plan of care cert/re-cert  Muscle weakness (generalized) - Plan: PT plan of care cert/re-cert  Difficulty in walking, not elsewhere classified - Plan: PT plan of care cert/re-cert     Problem List Patient Active Problem List   Diagnosis Date Noted  . Bimalleolar ankle fracture 11/18/2015  . Memory loss 03/14/2013  . Confusion   . GAD (generalized anxiety disorder) 09/12/2012    Caprice Red  PT 12/21/2015, 4:17 PM  [facility] [address] [place], [place], [address] Phone: [phone]   Fax:  [phone]  Name: Ricky Wu MRN: [id number] Date of Birth: Feb 23, 1986

## 2015-12-21 NOTE — Patient Instructions (Addendum)
From cabinet issued stretching for all ankle ROM  And use of ball for foot mobs. 2-3x/day 2-3 reps 30-60 sec.                  Elevation and ice above heart 2-3x/day

## 2015-12-30 ENCOUNTER — Ambulatory Visit: Payer: Medicaid Other | Admitting: Physical Therapy

## 2016-01-05 ENCOUNTER — Ambulatory Visit (INDEPENDENT_AMBULATORY_CARE_PROVIDER_SITE_OTHER): Payer: Medicaid Other | Admitting: Orthopedic Surgery

## 2016-01-13 ENCOUNTER — Ambulatory Visit: Payer: Medicaid Other | Attending: Orthopedic Surgery | Admitting: Physical Therapy

## 2016-01-13 ENCOUNTER — Encounter: Payer: Self-pay | Admitting: Physical Therapy

## 2016-01-13 DIAGNOSIS — M25672 Stiffness of left ankle, not elsewhere classified: Secondary | ICD-10-CM | POA: Diagnosis present

## 2016-01-13 DIAGNOSIS — R262 Difficulty in walking, not elsewhere classified: Secondary | ICD-10-CM | POA: Diagnosis present

## 2016-01-13 DIAGNOSIS — M6281 Muscle weakness (generalized): Secondary | ICD-10-CM | POA: Diagnosis present

## 2016-01-13 DIAGNOSIS — R600 Localized edema: Secondary | ICD-10-CM | POA: Diagnosis present

## 2016-01-13 NOTE — Therapy (Signed)
[facility] [address] [place], [place], [address] Phone: [phone]   Fax:  [phone]  Physical Therapy Treatment  Patient Details  Name: Ricky Wu MRN: [id number] Date of Birth: July 22, 1985 Referring Provider: Aldean Baker, MD  Encounter Date: 01/13/2016      PT End of Session - 01/13/16 1458    Visit Number 2   Number of Visits 4   Date for PT Re-Evaluation 02/15/16   Authorization Type Medicaid   PT Start Time 1500   PT Stop Time 1605   PT Time Calculation (min) 65 min   Activity Tolerance Patient tolerated treatment well   Behavior During Therapy [facility] for tasks assessed/performed      Past Medical History:  Diagnosis Date  . ADHD (attention deficit hyperactivity disorder)   . Anhedonia   . Anxiety   . Anxiety disorder   . Confusion   . Depression   . Disorganized thought process   . Penis disorder   . Sleep apnea    mild no cpap or bipap    Past Surgical History:  Procedure Laterality Date  . HAND SURGERY    . ORIF ANKLE FRACTURE Left 11/18/2015   Procedure: OPEN REDUCTION INTERNAL FIXATION (ORIF) LEFT ANKLE FRACTURE;  Surgeon: Nadara Mustard, MD;  Location: [facility] OR;  Service: Orthopedics;  Laterality: Left;    There were no vitals filed for this visit.      Subjective Assessment - 01/13/16 1500    Subjective Has had to help family move and do a good bit of walking lately. Feels the majority of the soreness the next day. Sharp pain when walking- sharp when pushing off at anterior aspect of ankle    Limitations Standing;Walking;House hold activities   Patient Stated Goals Get full motion of Lt ankle .    Currently in Pain? No/denies   Pain Score 3    Pain Location Ankle   Pain Orientation Left   Pain Descriptors / Indicators Sharp;Aching   Pain Type Surgical pain   Pain Onset More than a month ago   Pain Frequency Intermittent   Aggravating Factors  walking for too long   Pain Relieving  Factors rest                         [facility] Adult PT Treatment/Exercise - 01/13/16 0001      Ambulation/Gait   Gait Comments cues to avoid foot ER     Exercises   Exercises Knee/Hip;Ankle     Modalities   Modalities Cryotherapy     Cryotherapy   Number Minutes Cryotherapy 15 Minutes   Cryotherapy Location Ankle   Type of Cryotherapy Ice pack     Manual Therapy   Manual Therapy Joint mobilization   Joint Mobilization talocrural at end range in long sitting OKC and standing CKC     Ankle Exercises: Stretches   Gastroc Stretch Limitations slant board stretch   Other Stretch long sitting, DF stretch with towel     Ankle Exercises: Aerobic   Stationary Bike 5' L3     Ankle Exercises: Seated   Other Seated Ankle Exercises toe yoga, short foot   Other Seated Ankle Exercises long sitting inversion/eversion yellow tband                PT Education - 01/13/16 1503    Education provided Yes   Education Details gait pattern, exercise form/rationale, ASO wear, healing of wound  Person(s) Educated Patient   Methods Explanation;Demonstration;Tactile cues;Verbal cues   Comprehension Verbalized understanding;Returned demonstration;Verbal cues required;Tactile cues required;Need further instruction             PT Long Term Goals - 12/21/15 1456      PT LONG TERM GOAL #1   Title He will be independent with all HEP issued    Time 8   Period Weeks   Status New     PT LONG TERM GOAL #2   Title He will be able to walk communitiy distances with unilateral device WBAT   Time 8   Period Weeks   Status New     PT LONG TERM GOAL #3   Title His ROM with improve 5-15 degrees all planes to improve ambulation  with out pain.    Time 8   Period Weeks   Status New     PT LONG TERM GOAL #4   Title He will be able to stand Lt leg when allowed full weight bearing x 10 sec or more to demo improed functional balance.    Time 8   Period Weeks   Status New                Plan - 01/13/16 1507    Clinical Impression Statement Ambulates with external rotation due to lack of DF to allow proper toe off, cues for proper foot placement even with sharp pains. Decreased sharp anterior ankle pain following manual treatmnet today. 2cm wound at superior, lateral incision site that does not have an odor and does not appear to be open. Yellow color along incision.    PT Next Visit Plan REview HEP, add band exercises,  ROM /mobs, cold   Hand out social service handout / info places to call for  help for housing.    PT Home Exercise Plan resisted movement.   Consulted and Agree with Plan of Care Patient      Patient will benefit from skilled therapeutic intervention in order to improve the following deficits and impairments:     Visit Diagnosis: Stiffness of left ankle, not elsewhere classified  Localized edema  Muscle weakness (generalized)  Difficulty in walking, not elsewhere classified     Problem List Patient Active Problem List   Diagnosis Date Noted  . Bimalleolar ankle fracture 11/18/2015  . Memory loss 03/14/2013  . Confusion   . GAD (generalized anxiety disorder) 09/12/2012    Advit Trethewey C. Maleki Hippe PT, DPT 01/13/16 5:33 PM   [facility] [address] [place], [place], [address] Phone: [phone]   Fax:  [phone]  Name: Ricky Wu MRN: [id number] Date of Birth: [birth year]

## 2016-01-26 ENCOUNTER — Ambulatory Visit: Payer: Medicaid Other | Admitting: Physical Therapy

## 2016-01-26 DIAGNOSIS — M6281 Muscle weakness (generalized): Secondary | ICD-10-CM

## 2016-01-26 DIAGNOSIS — R600 Localized edema: Secondary | ICD-10-CM

## 2016-01-26 DIAGNOSIS — M25672 Stiffness of left ankle, not elsewhere classified: Secondary | ICD-10-CM

## 2016-01-26 DIAGNOSIS — R262 Difficulty in walking, not elsewhere classified: Secondary | ICD-10-CM

## 2016-01-26 NOTE — Therapy (Signed)
[facility] [address] [place], [place], [address] Phone: [phone]   Fax:  [phone]  Physical Therapy Treatment  Patient Details  Name: Ricky Wu MRN: [id number] Date of Birth: [birth year] Referring Provider: Aldean BakerMarcus Duda, MD  Encounter Date: 01/26/2016      PT End of Session - 01/26/16 1501    Visit Number 3   Number of Visits 5   Date for PT Re-Evaluation 02/15/16   Authorization Type Medicaid- auth 3 visits 10/23-12/3   PT Start Time 1502   PT Stop Time 1545   PT Time Calculation (min) 43 min   Activity Tolerance Patient tolerated treatment well   Behavior During Therapy [facility] for tasks assessed/performed      Past Medical History:  Diagnosis Date  . ADHD (attention deficit hyperactivity disorder)   . Anhedonia   . Anxiety   . Anxiety disorder   . Confusion   . Depression   . Disorganized thought process   . Penis disorder   . Sleep apnea    mild no cpap or bipap    Past Surgical History:  Procedure Laterality Date  . HAND SURGERY    . ORIF ANKLE FRACTURE Left 11/18/2015   Procedure: OPEN REDUCTION INTERNAL FIXATION (ORIF) LEFT ANKLE FRACTURE;  Surgeon: Nadara MustardMarcus V Duda, MD;  Location: [facility] OR;  Service: Orthopedics;  Laterality: Left;    There were no vitals filed for this visit.      Subjective Assessment - 01/26/16 1504    Subjective Began a new antibiotic for incision. Tenderness around the ball of his foot on superior aspect. BEginning to feel pain around the ankle. Sharp pain at anterior ankle when rolling off toes. Heel pain is resolved.    Currently in Pain? No/denies   Pain Score 4   during toe off   Pain Location Ankle   Pain Orientation Left;Anterior   Pain Descriptors / Indicators Sharp   Aggravating Factors  rolling over toe.                          [facility] Adult PT Treatment/Exercise - 01/26/16 0001      Knee/Hip Exercises: Aerobic   Stationary Bike 5 min L3      Knee/Hip Exercises: Supine   Bridges Limitations yellow tband around feet     Manual Therapy   Joint Mobilization OCK and CKC talocrural mobs     Ankle Exercises: Seated   Other Seated Ankle Exercises eversion with iso hip abd hold   Other Seated Ankle Exercises long sitting eversion, cues to avoid ER at hip     Ankle Exercises: Standing   Other Standing Ankle Exercises gait training-heel strike & toe off, control of supination     Ankle Exercises: Stretches   Soleus Stretch Other (comment)  long sitting with towel   Gastroc Stretch Limitations long sitting with towel                PT Education - 01/26/16 1507    Education provided Yes   Education Details exercise form/rationale, rationale for mobilizations, modalities and infection, gait   Person(s) Educated Patient   Methods Explanation;Demonstration;Tactile cues;Verbal cues   Comprehension Verbalized understanding;Returned demonstration;Verbal cues required;Tactile cues required;Need further instruction             PT Long Term Goals - 01/14/16 1129      PT LONG TERM GOAL #1   Title He will be independent with  all HEP issued    Baseline began establishing   Time 8   Period Weeks   Status On-going     PT LONG TERM GOAL #2   Title He will be able to walk communitiy distances with unilateral device WBAT   Baseline recently not walking with AD, pain   Time 8   Period Weeks   Status On-going     PT LONG TERM GOAL #3   Title His ROM with improve 5-15 degrees all planes to improve ambulation  with out pain.    Baseline see flowsheet   Status On-going     PT LONG TERM GOAL #4   Title He will be able to stand Lt leg when allowed full weight bearing x 10 sec or more to demo improed functional balance.    Baseline unable at this time   Time 8   Period Weeks   Status On-going               Plan - 01/26/16 1835    Clinical Impression Statement Pt is limited in DF due to soft tissue limitation  by gastroc/soleus complex resulting in anterior ankle pain. Pt has notable instability in lateral control of ankle during stance phase of gait. Supination during swing phase resulting in lateral heel strike and lateral ankle discomfort. Pt has not purchased a brace at this time due to financial reasons. Will continue to benefit from stability training to provide control to ankle during functional activities.    PT Next Visit Plan lateral stability, DF stretch   PT Home Exercise Plan resisted movement. bridge in DF with band around feet   Consulted and Agree with Plan of Care Patient      Patient will benefit from skilled therapeutic intervention in order to improve the following deficits and impairments:     Visit Diagnosis: Stiffness of left ankle, not elsewhere classified  Localized edema  Muscle weakness (generalized)  Difficulty in walking, not elsewhere classified     Problem List Patient Active Problem List   Diagnosis Date Noted  . Bimalleolar ankle fracture 11/18/2015  . Memory loss 03/14/2013  . Confusion   . GAD (generalized anxiety disorder) 09/12/2012    Hannahmarie Asberry C. Naleah Kofoed PT, DPT 01/26/16 6:43 PM   [facility] [address] [place], [place], [address] Phone: [phone]   Fax:  [phone]  Name: Ricky Wu MRN: [id number] Date of Birth: 12-03-1985

## 2016-01-27 ENCOUNTER — Encounter: Payer: Self-pay | Admitting: Physical Therapy

## 2016-02-02 ENCOUNTER — Encounter: Payer: Self-pay | Admitting: Physical Therapy

## 2016-02-02 ENCOUNTER — Ambulatory Visit: Payer: Medicaid Other | Attending: Orthopedic Surgery | Admitting: Physical Therapy

## 2016-02-02 DIAGNOSIS — M6281 Muscle weakness (generalized): Secondary | ICD-10-CM | POA: Diagnosis present

## 2016-02-02 DIAGNOSIS — R262 Difficulty in walking, not elsewhere classified: Secondary | ICD-10-CM | POA: Insufficient documentation

## 2016-02-02 DIAGNOSIS — R600 Localized edema: Secondary | ICD-10-CM | POA: Insufficient documentation

## 2016-02-02 DIAGNOSIS — M25672 Stiffness of left ankle, not elsewhere classified: Secondary | ICD-10-CM

## 2016-02-02 NOTE — Therapy (Signed)
[facility] [address] [place], [place], [address] Phone: [phone]   Fax:  [phone]  Physical Therapy Treatment  Patient Details  Name: Ricky Wu MRN: [id number] Date of Birth: 02/07/1986 Referring Provider: Aldean BakerMarcus Duda, MD  Encounter Date: 02/02/2016      PT End of Session - 02/02/16 1548    Visit Number 4   Number of Visits 5   Date for PT Re-Evaluation 02/15/16   Authorization Type Medicaid- auth 3 visits 10/23-12/3   PT Start Time 1548   PT Stop Time 1632   PT Time Calculation (min) 44 min   Activity Tolerance Patient tolerated treatment well   Behavior During Therapy [facility] for tasks assessed/performed      Past Medical History:  Diagnosis Date  . ADHD (attention deficit hyperactivity disorder)   . Anhedonia   . Anxiety   . Anxiety disorder   . Confusion   . Depression   . Disorganized thought process   . Penis disorder   . Sleep apnea    mild no cpap or bipap    Past Surgical History:  Procedure Laterality Date  . HAND SURGERY    . ORIF ANKLE FRACTURE Left 11/18/2015   Procedure: OPEN REDUCTION INTERNAL FIXATION (ORIF) LEFT ANKLE FRACTURE;  Surgeon: Nadara MustardMarcus V Duda, MD;  Location[facility];  Service: Orthopedics;  Laterality: Left;    There were no vitals filed for this visit.      Subjective Assessment - 02/02/16 1548    Subjective Was pretty active last nigth resulting in soreness today, for the most parts has been feeling stronger. Continues to have pain on superior aspect of foot just proximal to MTP joints.    Currently in Pain? Yes   Pain Location Ankle   Pain Descriptors / Indicators Sore                         [facility] Adult PT Treatment/Exercise - 02/02/16 0001      Knee/Hip Exercises: Stretches   Hip Flexor Stretch 30 seconds   Piriformis Stretch Both;30 seconds   Piriformis Stretch Limitations figure 4     Knee/Hip Exercises: Aerobic   Stationary Bike 5 min L3      Ankle Exercises: Seated   BAPS Level 2  circle both directions     Ankle Exercises: Standing   Rocker Board 2 minutes  A/P   Heel Raises 15 reps;Other (comment)  ball bw ankles for cuing   Other Standing Ankle Exercises tandem stance, tandem walking fwd/back   Other Standing Ankle Exercises side stepping yellow tband around feet                PT Education - 02/02/16 1551    Education provided Yes   Education Details exercise form/rationale   Person(s) Educated Patient   Methods Explanation;Demonstration;Tactile cues;Verbal cues;Handout   Comprehension Verbalized understanding;Returned demonstration;Verbal cues required;Tactile cues required;Need further instruction             PT Long Term Goals - 01/14/16 1129      PT LONG TERM GOAL #1   Title He will be independent with all HEP issued    Baseline began establishing   Time 8   Period Weeks   Status On-going     PT LONG TERM GOAL #2   Title He will be able to walk communitiy distances with unilateral device WBAT   Baseline recently not walking with AD, pain  Time 8   Period Weeks   Status On-going     PT LONG TERM GOAL #3   Title His ROM with improve 5-15 degrees all planes to improve ambulation  with out pain.    Baseline see flowsheet   Status On-going     PT LONG TERM GOAL #4   Title He will be able to stand Lt leg when allowed full weight bearing x 10 sec or more to demo improed functional balance.    Baseline unable at this time   Time 8   Period Weeks   Status On-going               Plan - 02/02/16 1635    Clinical Impression Statement Pt was able to perform exercises today with notable difficulty and fatigue. Decreased concordant forefoot pain when rolling over great toe. C/O hip pain due to external rotation of Wu leg as a compensatory pattern.    PT Next Visit Plan stance phase stability, reassess hip after stretching   Consulted and Agree with Plan of Care Patient       Patient will benefit from skilled therapeutic intervention in order to improve the following deficits and impairments:     Visit Diagnosis: Stiffness of left ankle, not elsewhere classified  Localized edema  Muscle weakness (generalized)  Difficulty in walking, not elsewhere classified     Problem List Patient Active Problem List   Diagnosis Date Noted  . Bimalleolar ankle fracture 11/18/2015  . Memory loss 03/14/2013  . Confusion   . GAD (generalized anxiety disorder) 09/12/2012    Telvin Reinders C. Sanuel Ladnier PT, DPT 02/02/16 4:40 PM   [facility] [address] [place], [place], [address] Phone: [phone]   Fax:  [phone]  Name: Ricky Wu MRN: [id number] Date of Birth: Nov 14, 1985

## 2016-02-08 ENCOUNTER — Encounter: Payer: Self-pay | Admitting: Physical Therapy

## 2016-02-08 ENCOUNTER — Ambulatory Visit: Payer: Medicaid Other | Admitting: Physical Therapy

## 2016-02-08 DIAGNOSIS — M25672 Stiffness of left ankle, not elsewhere classified: Secondary | ICD-10-CM

## 2016-02-08 DIAGNOSIS — M6281 Muscle weakness (generalized): Secondary | ICD-10-CM

## 2016-02-08 DIAGNOSIS — R600 Localized edema: Secondary | ICD-10-CM

## 2016-02-08 DIAGNOSIS — R262 Difficulty in walking, not elsewhere classified: Secondary | ICD-10-CM

## 2016-02-08 NOTE — Therapy (Signed)
[place], [place], [address] Phone: [phone]   Fax:  [phone]  Physical Therapy Treatment/Discharge Summary  Patient Details  Name: Ricky Wu MRN: [id number] Date of Birth: 1985-12-26 Referring Provider: Meridee Score, MD  Encounter Date: 02/08/2016      PT End of Session - 02/08/16 1504    Visit Number 5   Number of Visits 5   Date for PT Re-Evaluation 02/15/16   PT Start Time 1501   PT Stop Time 1539   PT Time Calculation (min) 38 min   Activity Tolerance Patient tolerated treatment well   Behavior During Therapy [facility] for tasks assessed/performed      Past Medical History:  Diagnosis Date  . ADHD (attention deficit hyperactivity disorder)   . Anhedonia   . Anxiety   . Anxiety disorder   . Confusion   . Depression   . Disorganized thought process   . Penis disorder   . Sleep apnea    mild no cpap or bipap    Past Surgical History:  Procedure Laterality Date  . HAND SURGERY    . ORIF ANKLE FRACTURE Left 11/18/2015   Procedure: OPEN REDUCTION INTERNAL FIXATION (ORIF) LEFT ANKLE FRACTURE;  Surgeon: Newt Minion, MD;  Location: [place];  Service: Orthopedics;  Laterality: Left;    There were no vitals filed for this visit.      Subjective Assessment - 02/08/16 1504    Subjective Minor pain in ankle today.    Currently in Pain? Yes   Pain Score 2    Pain Location Ankle   Pain Orientation Left            [facility] PT Assessment - 02/08/16 0001      AROM   Left Ankle Dorsiflexion 95   Left Ankle Plantar Flexion 58   Left Ankle Inversion 30   Left Ankle Eversion 18     PROM   Right Ankle Dorsiflexion 95   Right Ankle Plantar Flexion 80                     [facility] Adult PT Treatment/Exercise - 02/08/16 0001      Knee/Hip Exercises: Stretches   Piriformis Stretch Both;30 seconds   Piriformis Stretch Limitations figure 4   Other Knee/Hip Stretches long  sitting gastroc     Knee/Hip Exercises: Aerobic   Stationary Bike 5 min L3     Knee/Hip Exercises: Sidelying   Hip ABduction Limitations with 10 eversion mvmts per lift     Ankle Exercises: Standing   Heel Raises Other (comment)  alt heel/toe raises   Other Standing Ankle Exercises tandem walking fwd/back   Other Standing Ankle Exercises side stepping yellow tband around feet                PT Education - 02/08/16 1506    Education provided Yes   Education Details exercise form/rationale, HEP & long term management, gait pattern.    Person(s) Educated Patient   Methods Explanation;Demonstration;Tactile cues;Verbal cues   Comprehension Verbalized understanding;Returned demonstration;Verbal cues required;Tactile cues required;Need further instruction             PT Long Term Goals - 02/08/16 1507      PT LONG TERM GOAL #1   Title He will be independent with all HEP issued    Status Achieved     PT LONG TERM GOAL #2   Title He will be able to  walk communitiy distances with unilateral device WBAT   Status Achieved     PT LONG TERM GOAL #3   Title His ROM with improve 5-15 degrees all planes to improve ambulation  with out pain.    Status Partially Met     PT LONG TERM GOAL #4   Title He will be able to stand Lt leg when allowed full weight bearing x 10 sec or more to demo improed functional balance.    Status Achieved               Plan - 02/08/16 1540    Clinical Impression Statement Pt has made significant functional improvement since initial evaluation and has been d/c to independent home program. Pt verbalized comfort and understanding and was able to demonstrate exercises with minimal cuing. Instructed to contact us with any questions.    Consulted and Agree with Plan of Care Patient      Patient will benefit from skilled therapeutic intervention in order to improve the following deficits and impairments:     Visit Diagnosis: Stiffness of  left ankle, not elsewhere classified  Localized edema  Muscle weakness (generalized)  Difficulty in walking, not elsewhere classified     Problem List Patient Active Problem List   Diagnosis Date Noted  . Bimalleolar ankle fracture 11/18/2015  . Memory loss 03/14/2013  . Confusion   . GAD (generalized anxiety disorder) 09/12/2012    PHYSICAL THERAPY DISCHARGE SUMMARY  Visits from Start of Care: 5  Current functional level related to goals / functional outcomes: See above   Remaining deficits: See above   Education / Equipment: Anatomy of condition, POC, HEP, exercise form/rationale  Plan: Patient agrees to discharge.  Patient goals were partially met. Patient is being discharged due to meeting the stated rehab goals.  ?????    Ricky Wu C. Ricky Wu PT, DPT 02/08/16 3:44 PM   [facility] [address] [place], [place], [address] Phone: [phone]   Fax:  [phone]  Name: Ricky Wu MRN: [id number] Date of Birth: Feb 19, 1986

## 2016-03-26 ENCOUNTER — Encounter (HOSPITAL_COMMUNITY): Payer: Self-pay | Admitting: Emergency Medicine

## 2016-03-26 ENCOUNTER — Emergency Department (HOSPITAL_COMMUNITY)
Admission: EM | Admit: 2016-03-26 | Discharge: 2016-03-26 | Disposition: A | Discharge status: Home / Self Care | Payer: Medicaid Other | Attending: Emergency Medicine | Admitting: Emergency Medicine

## 2016-03-26 DIAGNOSIS — Z7982 Long term (current) use of aspirin: Secondary | ICD-10-CM | POA: Insufficient documentation

## 2016-03-26 DIAGNOSIS — Z79899 Other long term (current) drug therapy: Secondary | ICD-10-CM | POA: Insufficient documentation

## 2016-03-26 DIAGNOSIS — F17210 Nicotine dependence, cigarettes, uncomplicated: Secondary | ICD-10-CM | POA: Insufficient documentation

## 2016-03-26 DIAGNOSIS — F909 Attention-deficit hyperactivity disorder, unspecified type: Secondary | ICD-10-CM | POA: Diagnosis not present

## 2016-03-26 DIAGNOSIS — G4700 Insomnia, unspecified: Secondary | ICD-10-CM | POA: Insufficient documentation

## 2016-03-26 DIAGNOSIS — F410 Panic disorder [episodic paroxysmal anxiety] without agoraphobia: Secondary | ICD-10-CM | POA: Diagnosis present

## 2016-03-26 DIAGNOSIS — F419 Anxiety disorder, unspecified: Secondary | ICD-10-CM | POA: Diagnosis not present

## 2016-03-26 MED ORDER — BACITRACIN ZINC 500 UNIT/GM EX OINT: 1.0000 "application " | TOPICAL_OINTMENT | Freq: Two times a day (BID) | CUTANEOUS | 0 refills | Status: DC

## 2016-03-26 MED ORDER — CLONAZEPAM 1 MG PO TABS: 1.0000 mg | ORAL_TABLET | Freq: Two times a day (BID) | ORAL | 0 refills | Status: DC | PRN

## 2016-03-26 NOTE — ED Triage Notes (Signed)
Patient states that he hasnt been able to sleep which causing anxiety and panic attacks.  Patient was prescribed Depakote month ago but stopped taking because that he didn't like taking it.  Patient been having issues for past 4-6 years.  Pt denies Si or HI/

## 2016-03-26 NOTE — ED Notes (Signed)
PA at bedside.

## 2016-03-26 NOTE — ED Notes (Signed)
Pt offered blanket and/or drink. Pt denied.

## 2016-03-26 NOTE — ED Provider Notes (Signed)
[facility]-EMERGENCY DEPT Provider Note   CSN: [id number] Arrival date & time: 03/26/16  1516     History   Chief Complaint Chief Complaint  Patient presents with  . Panic Attack  . Insomnia    HPI Ricky Wu is a 30 y.o. male.  HPI   Pt with hx anxiety, ADHD, anhedonia, panic attacks p/w panic attacks, insomnia.  Pt has had psychiatric problems since 2011, when he began having anhedonia and anxiety/panic attacks.  Has been on multiple antidepressants and other medications that have left him with many side effects.  He was most recently on depakote, stopped early November.  Two days later he developed insomnia with panic attacks that occur just as he is trying to go to sleep and fear of going to sleep for this reason.  He was seen by his PCP Dr Midge AverPavelock who started him on Klonopin and [place].  He is also on Luvox.  He states he did well with the Klonopin and is now out.  He still has some ambien but reports he still doesn't sleep well despite using this.  Has an appointment with a psychiatrist in three days at his PCP's office.    Panic attacks consist of feeling sudden need to walk or run, get off the bus or out of wherever he is, feeling fear or "dread" in the stomach, nerves all over his body feel overwhelmed, feels like he is going to lose his mind and never regain it.  Denies hallucinations, SI, HI.    Past Medical History:  Diagnosis Date  . ADHD (attention deficit hyperactivity disorder)   . Anhedonia   . Anxiety   . Anxiety disorder   . Confusion   . Depression   . Disorganized thought process   . Penis disorder   . Sleep apnea    mild no cpap or bipap    Patient Active Problem List   Diagnosis Date Noted  . Bimalleolar ankle fracture 11/18/2015  . Memory loss 03/14/2013  . Confusion   . GAD (generalized anxiety disorder) 09/12/2012    Past Surgical History:  Procedure Laterality Date  . HAND SURGERY    . ORIF ANKLE FRACTURE Left 11/18/2015   Procedure:  OPEN REDUCTION INTERNAL FIXATION (ORIF) LEFT ANKLE FRACTURE;  Surgeon: Nadara MustardMarcus V Duda, MD;  Location: [facility] OR;  Service: Orthopedics;  Laterality: Left;       Home Medications    Prior to Admission medications   Medication Sig Start Date End Date Taking? Authorizing Provider  aspirin 325 MG EC tablet Take 1,300 mg by mouth every 6 (six) hours as needed for pain.    Historical Provider, MD  clonazePAM (KLONOPIN) 1 MG tablet Take 1 tablet (1 mg total) by mouth 2 (two) times daily as needed for anxiety. 03/26/16   Trixie DredgeEmily Spiro Ausborn, PA-C  gabapentin (NEURONTIN) 800 MG tablet Take 800 mg by mouth 3 (three) times daily.    Historical Provider, MD  HYDROcodone-acetaminophen (NORCO/VICODIN) 5-325 MG tablet Take 1 tablet by mouth every 6 (six) hours as needed. Patient taking differently: Take 1 tablet by mouth every 6 (six) hours as needed for moderate pain or severe pain.  11/07/15   Roxy Horsemanobert Browning, PA-C  oxyCODONE-acetaminophen (PERCOCET/ROXICET) 5-325 MG tablet Take 1 tablet by mouth every 6 (six) hours as needed for severe pain. 11/13/15   Ace GinsSerena Y Sam, PA-C  testosterone cypionate (DEPOTESTOSTERONE CYPIONATE) 200 MG/ML injection Inject 0.7 mLs into the muscle once a week. 10/27/15   Historical Provider, MD  venlafaxine XR (EFFEXOR-XR) 75 MG 24 hr capsule Take 3 capsules (225 mg total) by mouth daily with breakfast. Take one pill daily x 4 days, then two pills daily x 4 days, then 3 pills daily. Patient not taking: Reported on 11/07/2015 07/23/14   Trixie Dredge, PA-C  venlafaxine XR (EFFEXOR-XR) 75 MG 24 hr capsule Take 75 mg by mouth daily with breakfast.    Historical Provider, MD  zolpidem (AMBIEN) 5 MG tablet Take 1 tablet (5 mg total) by mouth at bedtime as needed for sleep. Patient not taking: Reported on 11/07/2015 07/23/14   Trixie Dredge, PA-C    Family History Family History  Problem Relation Age of Onset  . Hypertension Mother   . Diabetes Mother   . Cancer Father   . Arthritis Father   . Diabetes  Other     Social History Social History  Substance Use Topics  . Smoking status: Current Every Day Smoker    Packs/day: 1.50    Years: 14.00    Types: Cigarettes  . Smokeless tobacco: Never Used  . Alcohol use 0.0 oz/week     Comment: rare     Allergies   Adderall [amphetamine-dextroamphetamine]   Review of Systems Review of Systems  All other systems reviewed and are negative.    Physical Exam Updated Vital Signs BP 149/89 (BP Location: Left Arm)   Pulse 72   Temp 97.8 F (36.6 C) (Oral)   Resp 18   Ht 6\' 2"  (1.88 m)   Wt 96.2 kg   SpO2 97%   BMI 27.22 kg/m   Physical Exam  Constitutional: He appears well-developed and well-nourished. No distress.  HENT:  Head: Normocephalic and atraumatic.  Neck: Neck supple.  Cardiovascular: Normal rate and regular rhythm.   Pulmonary/Chest: Effort normal and breath sounds normal. No respiratory distress. He has no wheezes. He has no rales.  Abdominal: Soft. He exhibits no distension and no mass. There is no tenderness. There is no rebound and no guarding.  Neurological: He is alert. He exhibits normal muscle tone.  Skin: He is not diaphoretic.  Psychiatric: His speech is normal and behavior is normal. His mood appears anxious. He expresses no homicidal and no suicidal ideation.  Nursing note and vitals reviewed.    ED Treatments / Results  Labs (all labs ordered are listed, but only abnormal results are displayed) Labs Reviewed - No data to display  EKG  EKG Interpretation None       Radiology No results found.  Procedures Procedures (including critical care time)  Medications Ordered in ED Medications - No data to display   Initial Impression / Assessment and Plan / ED Course  I have reviewed the triage vital signs and the nursing notes.  Pertinent labs & imaging results that were available during my care of the patient were reviewed by me and considered in my medical decision making (see chart for  details).  Clinical Course     Afebrile, nontoxic patient with chronic psychiatric problems and multiple medication failures due to side effects.  Currently experiencing panic attacks and insomnia.  No SI, HI, hallucinations.  Has psych appt set up in 3 days.  Pt requesting Klonopin as it was helping with his symptoms before he ran out.  Checked on [employer], he was actually using it less frequently than prescribed.  I feel that this is appropriate.  D/C home with Klonopin, follow up as planned.  Discussed result, findings, treatment, and follow up  with  patient.  Pt given return precautions.  Pt verbalizes understanding and agrees with plan.       Final Clinical Impressions(s) / ED Diagnoses   Final diagnoses:  Panic attack  Anxiety  Insomnia, unspecified type    New Prescriptions New Prescriptions   CLONAZEPAM (KLONOPIN) 1 MG TABLET    Take 1 tablet (1 mg total) by mouth 2 (two) times daily as needed for anxiety.     Trixie Dredgemily Harrell Niehoff, PA-C 03/26/16 1838    Arby BarretteMarcy Pfeiffer, MD 03/31/16 [phone]

## 2016-03-26 NOTE — Discharge Instructions (Signed)
Read the information below.  You may return to the Emergency Department at any time for worsening condition or any new symptoms that concern you. °

## 2016-03-26 NOTE — ED Notes (Signed)
Pt states recurrent insomnia and anxiety related to out of Klonopin this past week. Pt verbalizes "I think getting it prescribed until I could see my doctor would help."

## 2016-08-22 ENCOUNTER — Encounter (HOSPITAL_COMMUNITY): Payer: Self-pay | Admitting: Family Medicine

## 2016-08-22 ENCOUNTER — Emergency Department (HOSPITAL_COMMUNITY): Payer: Medicaid Other

## 2016-08-22 ENCOUNTER — Emergency Department (HOSPITAL_COMMUNITY)
Admission: EM | Admit: 2016-08-22 | Discharge: 2016-08-22 | Disposition: A | Discharge status: Home / Self Care | Payer: Medicaid Other | Attending: Emergency Medicine | Admitting: Emergency Medicine

## 2016-08-22 DIAGNOSIS — M25539 Pain in unspecified wrist: Secondary | ICD-10-CM

## 2016-08-22 DIAGNOSIS — Z7982 Long term (current) use of aspirin: Secondary | ICD-10-CM | POA: Insufficient documentation

## 2016-08-22 DIAGNOSIS — M25572 Pain in left ankle and joints of left foot: Secondary | ICD-10-CM

## 2016-08-22 DIAGNOSIS — M25532 Pain in left wrist: Secondary | ICD-10-CM | POA: Insufficient documentation

## 2016-08-22 DIAGNOSIS — F17210 Nicotine dependence, cigarettes, uncomplicated: Secondary | ICD-10-CM | POA: Insufficient documentation

## 2016-08-22 DIAGNOSIS — F909 Attention-deficit hyperactivity disorder, unspecified type: Secondary | ICD-10-CM | POA: Insufficient documentation

## 2016-08-22 NOTE — Progress Notes (Signed)
Orthopedic Tech Progress Note Patient Details:  Ricky ElseDonnie R Wu 01/08/1986 [id number]  Ortho Devices Type of Ortho Device: Velcro wrist splint Ortho Device/Splint Location: Lt wrist Ortho Device/Splint Interventions: Application   Ricky Wu 08/22/2016, 10:07 PM

## 2016-08-22 NOTE — ED Triage Notes (Signed)
Patient is complaining of left wrist and left ankle surgery. Pt denies any recent injury. Also, reports after taking DEPAKOTE he has had joint pain. Patient is able to move both joints with difficulty.

## 2016-08-22 NOTE — Discharge Instructions (Signed)
Follow-up with her primary care doctor in the next 24-40 hours for further evaluation.  Use the wrist splint for support and stabilization.  You can take Tylenol or ibuprofen as instructed for pain.   Return the emergency Department for any worsening pain, swelling, fever, numbness/weakness, difficulty breathing, chest pain or any other worsening or concerning symptoms.

## 2016-08-22 NOTE — ED Provider Notes (Signed)
[facility]-EMERGENCY DEPT Provider Note   CSN: [id number] Arrival date & time: 08/22/16  1942  By signing my name below, I, Modena JanskyAlbert Thayil, attest that this documentation has been prepared under the direction and in the presence of non-physician practitioner, Maxwell CaulLindsey A Layden, PA-C. Electronically Signed: Modena JanskyAlbert Thayil, Scribe. 08/22/2016. 8:19 PM.  History   Chief Complaint Chief Complaint  Patient presents with  . Extremity Pain   The history is provided by the patient. No language interpreter was used.   HPI Comments: Ricky Wu is a 31 y.o. male, with a PMHx of ADHD, anxiety, and depression, who presents to the Emergency Department complaining of constant moderate left wrist pain and chronic left ankle pain that started about 4 months ago. Patient has a history of injury to the left wrist many years ago that resulted in surgical repair. He reports that his wrist had been doing better until 4 months ago when he started experiencing pain. No new trauma or injury. His pain is worsened with movement of the left wrist. 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 Patient reports that he fractured his left ankle 9 months ago and was treated with surgical repair. He reports that he had hardware placed in left ankle. He reports that over the last several months he is continued to experiencing sharp pain to his medial ankle. His pain is worsened with movement. He has been taking meloxicam with minimal improvement in pain. Patient reports that over the last several years he has had multiple complaints and issues that he has brought up to his primary care doctor. Patient states that he was started on Depakote in January 2018 and began experiencing some general joint aches afterwards. He was taken off the medication. Patient also reports 2 years of intermittent dizziness and feeling "disoriented." He states that he has been evaluated by his primary care doctor for  this. He denies any new or changes in symptoms. He denies recently being in the once or insect bites. Patient reports he was sick with a URI 2 weeks ago and had a mild fever but states that that resolved. He has had no other fever. Denies any gait problem, numbness, weakness, joint swelling, redness, rash, or other complaints at this time.    PCP: Gilda CreasePavelock, Richard M, MD Orthopedist: Dr. Aldean BakerMarcus Duda   Past Medical History:  Diagnosis Date  . ADHD (attention deficit hyperactivity disorder)   . Anhedonia   . Anxiety   . Anxiety disorder   . Confusion   . Depression   . Disorganized thought process   . Penis disorder   . Sleep apnea    mild no cpap or bipap    Patient Active Problem List   Diagnosis Date Noted  . Bimalleolar ankle fracture 11/18/2015  . Memory loss 03/14/2013  . Confusion   . GAD (generalized anxiety disorder) 09/12/2012    Past Surgical History:  Procedure Laterality Date  . HAND SURGERY    . ORIF ANKLE FRACTURE Left 11/18/2015   Procedure: OPEN REDUCTION INTERNAL FIXATION (ORIF) LEFT ANKLE FRACTURE;  Surgeon: Nadara MustardMarcus V Duda, MD;  Location: [facility] OR;  Service: Orthopedics;  Laterality: Left;       Home Medications    Prior to Admission medications   Medication Sig Start Date End Date Taking? Authorizing Provider  aspirin 325 MG EC tablet Take 1,300 mg by mouth every 6 (six) hours as needed for pain.    [provider]  bacitracin ointment Apply 1 application topically 2 (two) times daily. 03/26/16   Trixie Dredge, PA-C  clonazePAM (KLONOPIN) 1 MG tablet Take 1 tablet (1 mg total) by mouth 2 (two) times daily as needed for anxiety. 03/26/16   Trixie Dredge, PA-C  gabapentin (NEURONTIN) 800 MG tablet Take 800 mg by mouth 3 (three) times daily.    [provider]  HYDROcodone-acetaminophen (NORCO/VICODIN) 5-325 MG tablet Take 1 tablet by mouth every 6 (six) hours as needed. Patient taking differently: Take 1 tablet by mouth every 6 (six) hours as  needed for moderate pain or severe pain.  11/07/15   Roxy Horseman, PA-C  oxyCODONE-acetaminophen (PERCOCET/ROXICET) 5-325 MG tablet Take 1 tablet by mouth every 6 (six) hours as needed for severe pain. 11/13/15   Sam, Ace Gins, PA-C  testosterone cypionate (DEPOTESTOSTERONE CYPIONATE) 200 MG/ML injection Inject 0.7 mLs into the muscle once a week. 10/27/15   [provider]  venlafaxine XR (EFFEXOR-XR) 75 MG 24 hr capsule Take 3 capsules (225 mg total) by mouth daily with breakfast. Take one pill daily x 4 days, then two pills daily x 4 days, then 3 pills daily. Patient not taking: Reported on 11/07/2015 07/23/14   Trixie Dredge, PA-C  venlafaxine XR (EFFEXOR-XR) 75 MG 24 hr capsule Take 75 mg by mouth daily with breakfast.    [provider]  zolpidem (AMBIEN) 5 MG tablet Take 1 tablet (5 mg total) by mouth at bedtime as needed for sleep. Patient not taking: Reported on 11/07/2015 07/23/14   Trixie Dredge, PA-C    Family History Family History  Problem Relation Age of Onset  . Hypertension Mother   . Diabetes Mother   . Cancer Father   . Arthritis Father   . Diabetes Other     Social History Social History  Substance Use Topics  . Smoking status: Current Every Day Smoker    Packs/day: 1.50    Years: 14.00    Types: Cigarettes  . Smokeless tobacco: Never Used  . Alcohol use No     Comment: Last drink: Over a year     Allergies   Adderall [amphetamine-dextroamphetamine]   Review of Systems Review of Systems  Constitutional: Negative for fever.  Musculoskeletal: Positive for arthralgias and myalgias. Negative for gait problem and joint swelling.  Skin: Negative for color change and rash.  Neurological: Positive for dizziness. Negative for weakness.     Physical Exam Updated Vital Signs BP (!) 150/104 (BP Location: Right Arm)   Pulse 78   Temp 98.3 F (36.8 C) (Oral)   Resp 18   Ht 6\' 2"  (1.88 m)   Wt 214 lb (97.1 kg)   SpO2 99%   BMI 27.48 kg/m    Physical Exam  Constitutional: He appears well-developed and well-nourished.  Sitting comfortably on examination table  HENT:  Head: Normocephalic and atraumatic.  Eyes: Conjunctivae and EOM are normal. Pupils are equal, round, and reactive to light. Right eye exhibits no discharge. Left eye exhibits no discharge. No scleral icterus.  No nystagmus.  Cardiovascular:  Pulses:      Radial pulses are 2+ on the right side, and 2+ on the left side.  Pulmonary/Chest: Effort normal.  Musculoskeletal:  Small well-healed surgical scar over the dorsal aspect of the left hand. Tenderness palpation to the ulnar aspect of left breast. Full flexion/extension of left wrist intact without any difficulty. No snuffbox tenderness. Positive Tinel's sign of the left wrist. Positive Phalen's test. No abnormalities of right wrist. Left  ankle with well-healed surgical incision car on the medial malleolus. Mild tenderness palpation to the medial malleolus. Dorsiflexion and plantar flexion of left ankle intact fully without difficulty.  Neurological: He is alert.  Cranial nerves III-XII intact Follows commands, Moves all extremities  5/5 strength to BUE and BLE  Sensation intact throughout  Normal finger to nose. No dysdiadochokinesia. No pronator drift. No gait abnormalities  No slurred speech. No facial droop.   Skin: Skin is warm and dry. Capillary refill takes less than 2 seconds.  No overlying ecchymosis, edema, erythema or warmth to the left wrist or left ankle.  Psychiatric: He has a normal mood and affect. His speech is normal and behavior is normal.  Nursing note and vitals reviewed.    ED Treatments / Results  DIAGNOSTIC STUDIES: Oxygen Saturation is 99% on RA, normal by my interpretation.    COORDINATION OF CARE: 8:23 PM- Pt advised of plan for treatment and pt agrees.  Labs (all labs ordered are listed, but only abnormal results are displayed) Labs Reviewed - No data to display  EKG   EKG Interpretation None       Radiology Dg Wrist Complete Left  Result Date: 08/22/2016 CLINICAL DATA:  Acute left wrist pain without known injury. EXAM: LEFT WRIST - COMPLETE 3+ VIEW COMPARISON:  Radiographs of Jul 30, 2010. FINDINGS: There is no evidence of fracture or dislocation. There is no evidence of arthropathy or other focal bone abnormality. Soft tissues are unremarkable. IMPRESSION: Normal left wrist. Electronically Signed   By: Lupita Raider, M.D.   On: 08/22/2016 21:30   Dg Ankle Complete Left  Result Date: 08/22/2016 CLINICAL DATA:  Acute left ankle pain without known injury. EXAM: LEFT ANKLE COMPLETE - 3+ VIEW COMPARISON:  Radiographs of January 05, 2016. FINDINGS: Status post surgical internal fixation of distal fibular and medial malleolar fractures. No acute fracture or dislocation is noted. Joint spaces are intact. Mild soft tissue swelling is seen overlying the medial malleolus. IMPRESSION: Postsurgical changes as described above. No acute fracture or dislocation is noted. Mild soft tissue swelling is seen overlying the medial malleolus suggesting ligamentous injury. Electronically Signed   By: Lupita Raider, M.D.   On: 08/22/2016 21:33    Procedures Procedures (including critical care time)  Medications Ordered in ED Medications - No data to display   Initial Impression / Assessment and Plan / ED Course  I have reviewed the triage vital signs and the nursing notes.  Pertinent labs & imaging results that were available during my care of the patient were reviewed by me and considered in my medical decision making (see chart for details).     31 year old male who presents with persistent left wrist and left ankle pain that has been going on for several months. Patient has a history of anxiety and states that this may be contributing to his symptoms. He reports that he is very concerned about a new fracture in his ankle or wrist. History/physical exam are not  concerning for acute fracture or dislocation or septic joint. Wrist pain is concerning for carpal tunnel given positive Tinel's and Phalen's. Explained to him that he could be having residual ankle pain secondary to previous incident and surgical repair.Given lack of trauma or injury, low indication for x-ray of left ankle or left wrist. Explained this to patient but he states that he is very anxious and concerned about there being a break or hardware concerned and he feels like he needs the reassurance  of an x-ray. Given patient's concerns, will plan to order x-ray of left wrist and left ankle. Discussed patient's 2 year history of dizziness and "disorientation.". He is not currently having any of the symptoms. But states that he has been concerned and has brought it up to his primary care doctor. Patient has no neuro deficits on exam. No indications for imaging at this time.  X-rays reviewed. Surgical hardware appears in place on the left wrist and left ankle. No acute fracture or dislocation noted. Discussed results with patient. Explained to him that left wrist pain could be result of carpal tunnel. We'll plan to provide splint for support and stabilization. Patient instructed on conservative therapy measures. Instructed to follow up with orthopedic doctor if no improvement in symptoms. Patient instructed to continue taking NSAIDs for symptomatic relief. Strict return precautions discussed. Patient expresses understanding and agreement to plan.  Final Clinical Impressions(s) / ED Diagnoses   Final diagnoses:  Wrist pain  Acute left ankle pain    New Prescriptions Discharge Medication List as of 08/22/2016 10:11 PM     I personally performed the services described in this documentation, which was scribed in my presence. The recorded information has been reviewed and is accurate.      Maxwell Caul, PA-C 08/23/16 1610    Arby Barrette, MD 08/26/16 [phone]

## 2016-08-26 ENCOUNTER — Encounter (HOSPITAL_COMMUNITY): Payer: Self-pay | Admitting: Emergency Medicine

## 2016-08-26 ENCOUNTER — Emergency Department (HOSPITAL_COMMUNITY)
Admission: EM | Admit: 2016-08-26 | Discharge: 2016-08-26 | Disposition: A | Discharge status: Home / Self Care | Payer: Medicaid Other | Attending: Emergency Medicine | Admitting: Emergency Medicine

## 2016-08-26 DIAGNOSIS — F17210 Nicotine dependence, cigarettes, uncomplicated: Secondary | ICD-10-CM | POA: Insufficient documentation

## 2016-08-26 DIAGNOSIS — R45851 Suicidal ideations: Secondary | ICD-10-CM | POA: Diagnosis not present

## 2016-08-26 DIAGNOSIS — F10920 Alcohol use, unspecified with intoxication, uncomplicated: Secondary | ICD-10-CM

## 2016-08-26 DIAGNOSIS — F1010 Alcohol abuse, uncomplicated: Secondary | ICD-10-CM | POA: Diagnosis present

## 2016-08-26 DIAGNOSIS — F411 Generalized anxiety disorder: Secondary | ICD-10-CM | POA: Diagnosis present

## 2016-08-26 DIAGNOSIS — F10929 Alcohol use, unspecified with intoxication, unspecified: Secondary | ICD-10-CM | POA: Insufficient documentation

## 2016-08-26 DIAGNOSIS — Z7982 Long term (current) use of aspirin: Secondary | ICD-10-CM | POA: Insufficient documentation

## 2016-08-26 DIAGNOSIS — Z79899 Other long term (current) drug therapy: Secondary | ICD-10-CM | POA: Diagnosis not present

## 2016-08-26 LAB — CBC WITH DIFFERENTIAL/PLATELET
Basophils Absolute: 0 K/uL (ref 0.0–0.1)
Basophils Relative: 0 %
Eosinophils Absolute: 0 K/uL (ref 0.0–0.7)
Eosinophils Relative: 0 %
HCT: 44.8 % (ref 39.0–52.0)
Hemoglobin: 15.5 g/dL (ref 13.0–17.0)
Lymphocytes Relative: 40 %
Lymphs Abs: 3.9 K/uL (ref 0.7–4.0)
MCH: 31.4 pg (ref 26.0–34.0)
MCHC: 34.6 g/dL (ref 30.0–36.0)
MCV: 90.9 fL (ref 78.0–100.0)
Monocytes Absolute: 0.9 K/uL (ref 0.1–1.0)
Monocytes Relative: 9 %
Neutro Abs: 5.1 K/uL (ref 1.7–7.7)
Neutrophils Relative %: 51 %
Platelets: 193 K/uL (ref 150–400)
RBC: 4.93 MIL/uL (ref 4.22–5.81)
RDW: 13.9 % (ref 11.5–15.5)
WBC: 9.9 K/uL (ref 4.0–10.5)

## 2016-08-26 LAB — COMPREHENSIVE METABOLIC PANEL WITH GFR
ALT: 32 U/L (ref 17–63)
AST: 34 U/L (ref 15–41)
Albumin: 4.1 g/dL (ref 3.5–5.0)
Alkaline Phosphatase: 75 U/L (ref 38–126)
Anion gap: 10 (ref 5–15)
BUN: 11 mg/dL (ref 6–20)
CO2: 23 mmol/L (ref 22–32)
Calcium: 8.4 mg/dL — ABNORMAL LOW (ref 8.9–10.3)
Chloride: 109 mmol/L (ref 101–111)
Creatinine, Ser: 0.9 mg/dL (ref 0.61–1.24)
GFR calc Af Amer: >60 mL/min
GFR calc non Af Amer: >60 mL/min
Glucose, Bld: 101 mg/dL — ABNORMAL HIGH (ref 65–99)
Potassium: 4.7 mmol/L (ref 3.5–5.1)
Sodium: 142 mmol/L (ref 135–145)
Total Bilirubin: 0.8 mg/dL (ref 0.3–1.2)
Total Protein: 7.9 g/dL (ref 6.5–8.1)

## 2016-08-26 LAB — ETHANOL: Alcohol, Ethyl (B): 157 mg/dL — ABNORMAL HIGH

## 2016-08-26 LAB — RAPID URINE DRUG SCREEN, HOSP PERFORMED
AMPHETAMINES: NOT DETECTED
BARBITURATES: NOT DETECTED
BENZODIAZEPINES: NOT DETECTED
COCAINE: NOT DETECTED
OPIATES: NOT DETECTED
TETRAHYDROCANNABINOL: NOT DETECTED

## 2016-08-26 MED ORDER — VENLAFAXINE HCL ER 75 MG PO CP24: 75.0000 mg | ORAL_CAPSULE | Freq: Every day | ORAL | Status: DC

## 2016-08-26 MED ORDER — GABAPENTIN 300 MG PO CAPS: 300.0000 mg | ORAL_CAPSULE | Freq: Three times a day (TID) | ORAL | 0 refills | Status: DC

## 2016-08-26 MED ORDER — GABAPENTIN 300 MG PO CAPS
300.0000 mg | ORAL_CAPSULE | Freq: Three times a day (TID) | ORAL | Status: DC
Start: 2016-08-26 — End: 2016-08-26

## 2016-08-26 MED ORDER — IBUPROFEN 200 MG PO TABS: 600.0000 mg | ORAL_TABLET | Freq: Three times a day (TID) | ORAL | Status: DC | PRN

## 2016-08-26 MED ORDER — ACETAMINOPHEN 325 MG PO TABS
650.0000 mg | ORAL_TABLET | ORAL | Status: DC | PRN
Start: 2016-08-26 — End: 2016-08-26

## 2016-08-26 MED ORDER — GABAPENTIN 400 MG PO CAPS
800.0000 mg | ORAL_CAPSULE | Freq: Once | ORAL | Status: AC
  Administered 2016-08-26: 800 mg via ORAL
  Filled 2016-08-26: qty 2

## 2016-08-26 MED ORDER — CLONAZEPAM 0.5 MG PO TABS: 1.0000 mg | ORAL_TABLET | Freq: Two times a day (BID) | ORAL | Status: DC | PRN

## 2016-08-26 MED ORDER — NICOTINE 21 MG/24HR TD PT24: 21.0000 mg | MEDICATED_PATCH | Freq: Every day | TRANSDERMAL | Status: DC

## 2016-08-26 MED ORDER — GABAPENTIN 400 MG PO CAPS: 800.0000 mg | ORAL_CAPSULE | Freq: Three times a day (TID) | ORAL | Status: DC

## 2016-08-26 MED ORDER — ONDANSETRON HCL 4 MG PO TABS: 4.0000 mg | ORAL_TABLET | Freq: Three times a day (TID) | ORAL | Status: DC | PRN

## 2016-08-26 MED ORDER — ZOLPIDEM TARTRATE 5 MG PO TABS: 5.0000 mg | ORAL_TABLET | Freq: Every evening | ORAL | Status: DC | PRN

## 2016-08-26 MED ORDER — ALUM & MAG HYDROXIDE-SIMETH 200-200-20 MG/5ML PO SUSP: 30.0000 mL | Freq: Four times a day (QID) | ORAL | Status: DC | PRN

## 2016-08-26 NOTE — ED Triage Notes (Signed)
[facility] after he was found laying in the road on [address] by a cab driver  When EMS got there the pt was able to get up and walk to the stretcher with some assistance  Pt admits to drinking copious amts of alcohol tonight  Pt has mental health issues and is requesting help with that tonight  Pt reports to EMS that he has had SI in the past but not tonight

## 2016-08-26 NOTE — BH Assessment (Signed)
Assessment Note  Ricky Wu is an 31 y.o. male. Patient has a history of ADHD, Anxiety, Depression, and Disorganized thought process. Patient presents to [facility] with via EMS. He was reportedly found on the ground in a intoxicated state. Patient did not recall how he arrived and didn't completely understand why he was in the Emergency Department. He does recall drinking a lot of alcohol. He does have a history of binge drinking. He denies current suicidal thoughts. However has a significant history of depression associated with suicidal thoughts for the past 5 yrs. Sts that he overdosed 5 yrs ago on Adderall and has no sense been able to control his depression. His outpatient provider at [facility] started him on Effexor 2 months ago and he has since felt worse. His suicidal thoughts are intermittent but he denies history of plan. He denies HI. Denies legal issues. No AVH's. Patient has received treatment at [facility] on the OBS unit 08/2015. He has limited family support. Sts he lives "household to household" with various family members.   Diagnosis: Major Depressive Disorder, Recurrent, Severe, with psychotic features; ADHD; Anxiety Disorder  Past Medical History:  Past Medical History:  Diagnosis Date  . ADHD (attention deficit hyperactivity disorder)   . Anhedonia   . Anxiety   . Anxiety disorder   . Confusion   . Depression   . Disorganized thought process   . Penis disorder   . Sleep apnea    mild no cpap or bipap    Past Surgical History:  Procedure Laterality Date  . HAND SURGERY    . ORIF ANKLE FRACTURE Left 11/18/2015   Procedure: OPEN REDUCTION INTERNAL FIXATION (ORIF) LEFT ANKLE FRACTURE;  Surgeon: Nadara Mustard, MD;  Location: [facility] OR;  Service: Orthopedics;  Laterality: Left;    Family History:  Family History  Problem Relation Age of Onset  . Hypertension Mother   . Diabetes Mother   . Cancer Father   . Arthritis Father   . Diabetes Other     Social History:   reports that he has been smoking Cigarettes.  He has a 21.00 pack-year smoking history. He has never used smokeless tobacco. He reports that he drinks alcohol. He reports that he does not use drugs.  Additional Social History:  Alcohol / Drug Use Pain Medications: See MAR Prescriptions: See MAR Over the Counter: See MAR History of alcohol / drug use?: Yes Withdrawal Symptoms: Agitation Substance #1 Name of Substance 1: Alcohol  1 - Age of First Use: 31 yrs old  1 - Amount (size/oz): "I don't know....but it's alot..." 1 - Frequency: on-going since the age of 17 1 - Duration: on-going; alcohol binges  1 - Last Use / Amount: 08/26/2016 Substance #2 Name of Substance 2: Adderal  2 - Age of First Use: 31 yrs old  2 - Amount (size/oz): "I can't remember" 2 - Frequency: daily  2 - Duration: on-going  2 - Last Use / Amount: 5 yrs ago  CIWA: CIWA-Ar BP: 116/86 Pulse Rate: 70 Nausea and Vomiting: no nausea and no vomiting Tactile Disturbances: none Tremor: no tremor Auditory Disturbances: not present Paroxysmal Sweats: no sweat visible Visual Disturbances: not present Anxiety: no anxiety, at ease Headache, Fullness in Head: none present Agitation: normal activity Orientation and Clouding of Sensorium: cannot do serial additions or is uncertain about date CIWA-Ar Total: 1 COWS:    Allergies:  Allergies  Allergen Reactions  . Adderall [Amphetamine-Dextroamphetamine] Other (See Comments)  Accidental overdose    Home Medications:  (Not in a hospital admission)  OB/GYN Status:  No LMP for male patient.  General Assessment Data Location of Assessment: [facility] ED TTS Assessment: In system Is this a Tele or Face-to-Face Assessment?: Face-to-Face Is this an Initial Assessment or a Re-assessment for this encounter?: Initial Assessment Marital status: Single Maiden name:  (n/a) Is patient pregnant?: No Pregnancy Status: No Living Arrangements: Other (Comment) (no steady place to  live....lives with varous family members ) Can pt return to current living arrangement?: No Admission Status: Voluntary Is patient capable of signing voluntary admission?: Yes Referral Source: Self/Family/Friend Insurance type:  (Self Pay )     Crisis Care Plan Living Arrangements: Other (Comment) (no steady place to live....lives with varous family members ) Legal Guardian: Other: (no guardian ) Name of Psychiatrist:  (no psychiatrist ) Name of Therapist:  (no therapist)  Education Status Is patient currently in school?: No Current Grade:  (n/a) Highest grade of school patient has completed:  (n/a) Name of school:  (n/a) Contact person:  (n/a)  Risk to self with the past 6 months Suicidal Ideation: No-Not Currently/Within Last 6 Months Suicidal Intent: No-Not Currently/Within Last 6 Months Is patient at risk for suicide?: No Suicidal Plan?: No-Not Currently/Within Last 6 Months Has patient had any suicidal plan within the past 6 months prior to admission? : No Access to Means: No What has been your use of drugs/alcohol within the last 12 months?:  (alcohol binges) Previous Attempts/Gestures: Yes How many times?:  (5 yrs ago made one suicide attempt ) Other Self Harm Risks:  (no self harm ) Triggers for Past Attempts: Other (Comment) (no past attempts or gestures ) Intentional Self Injurious Behavior: None (denies ) Family Suicide History: Unknown Recent stressful life event(s): Other (Comment) ("I have anhedonia from a past overdose.Marland KitchenMarland KitchenI can't find relief) Persecutory voices/beliefs?: No Depression: Yes Depression Symptoms: Feeling worthless/self pity, Loss of interest in usual pleasures, Guilt, Fatigue, Isolating Substance abuse history and/or treatment for substance abuse?: No Suicide prevention information given to non-admitted patients: Not applicable  Risk to Others within the past 6 months Homicidal Ideation: No Does patient have any lifetime risk of violence toward  others beyond the six months prior to admission? : No Thoughts of Harm to Others: No Current Homicidal Intent: No Current Homicidal Plan: No Access to Homicidal Means: No Identified Victim:  (n/a) History of harm to others?: No Assessment of Violence: None Noted Violent Behavior Description:  (patient is calm and cooperative ) Does patient have access to weapons?: No Criminal Charges Pending?: No Does patient have a court date: No Is patient on probation?: No  Psychosis Hallucinations: None noted Delusions: None noted  Mental Status Report Appearance/Hygiene: In scrubs Eye Contact: Good Motor Activity: Freedom of movement Speech: Logical/coherent Level of Consciousness: Alert Mood: Depressed Affect: Appropriate to circumstance Anxiety Level: None Thought Processes: Relevant Judgement: Impaired Orientation: Person, Place, Time, Situation Obsessive Compulsive Thoughts/Behaviors: None  Cognitive Functioning Concentration: Decreased Memory: Recent Intact, Remote Intact IQ: Average Insight: Fair Impulse Control: Fair Appetite: Fair Weight Loss:  (none reported ) Weight Gain:  (none reported) Sleep: Decreased Vegetative Symptoms: None  ADLScreening [facility] Assessment Services) Patient's cognitive ability adequate to safely complete daily activities?: Yes Patient able to express need for assistance with ADLs?: Yes Independently performs ADLs?: Yes (appropriate for developmental age)  Prior Inpatient Therapy Prior Inpatient Therapy: Yes Prior Therapy Dates:  (10/2015) Prior Therapy Facilty/Provider(s):  (OBS at [facility] ) Reason for Treatment:  (  depression and suicidal ideations )  Prior Outpatient Therapy Prior Outpatient Therapy: Yes Prior Therapy Dates:  (current ) Prior Therapy Facilty/Provider(s):  ([facility] ) Reason for Treatment:  (depression and anxiety ) Does patient have an ACCT team?: No Does patient have Intensive In-House Services?  : No Does  patient have [facility] services? : No Does patient have P4CC services?: No  ADL Screening (condition at time of admission) Patient's cognitive ability adequate to safely complete daily activities?: Yes Does the patient have difficulty seeing, even when wearing glasses/contacts?: No Does the patient have difficulty concentrating, remembering, or making decisions?: No Patient able to express need for assistance with ADLs?: Yes Does the patient have difficulty dressing or bathing?: No Independently performs ADLs?: Yes (appropriate for developmental age) Does the patient have difficulty walking or climbing stairs?: No Weakness of Legs: None Weakness of Arms/Hands: None  Home Assistive Devices/Equipment Home Assistive Devices/Equipment: None    Abuse/Neglect Assessment (Assessment to be complete while patient is alone) Physical Abuse: Denies Verbal Abuse: Denies Sexual Abuse: Denies Exploitation of patient/patient's resources: Denies Self-Neglect: Denies Values / Beliefs Cultural Requests During Hospitalization: None Spiritual Requests During Hospitalization: None   Advance Directives (For Healthcare) Does Patient Have a Medical Advance Directive?: No Would patient like information on creating a medical advance directive?: No - Patient declined Nutrition Screen- [facility] Patient's home diet: Regular  Additional Information 1:1 In Past 12 Months?: No CIRT Risk: No Elopement Risk: No Does patient have medical clearance?: Yes     Disposition:  Disposition Initial Assessment Completed for this Encounter: Yes (Pending am psych evaluation ) Disposition of Patient: Other dispositions (Per Dr. Rosalva FerronAkintayo/Jamison Lord, DNP, overnight observation) Other disposition(s): Other (Comment) (Pending am psych evaluation; not comittable..may D/C if he w)  On Site Evaluation by:   Reviewed with Physician:    Melynda Rippleoyka Dorrell Mitcheltree 08/26/2016 12:31 PM

## 2016-08-26 NOTE — BH Assessment (Signed)
[facility] Assessment Progress Note  Per Thedore MinsMojeed Akintayo, MD, this pt does not require psychiatric hospitalization at this time.  [facility].  This has been included in pt's discharge instructions.  Pt's nurse has been notified.  Doylene Canninghomas Kharee Lesesne, MA Triage Specialist [phone]

## 2016-08-26 NOTE — Discharge Instructions (Signed)
For your ongoing behavioral health needs, you are advised to continue treatment with [facility]: ° °     [facility] °     [address] °     [place], [place] [id number] °     [phone] °

## 2016-08-26 NOTE — ED Provider Notes (Signed)
[facility]-EMERGENCY DEPT Provider Note   CSN: [id number] Arrival date & time: 08/26/16  0357     History   Chief Complaint Chief Complaint  Patient presents with  . Alcohol Intoxication    HPI Ricky Wu is a 31 y.o. male.  The history is provided by the patient and the EMS personnel. The history is limited by the condition of the patient (Intoxicated).  He was brought in by ambulance after having been found laying on the ground and having resumed a large amount of alcohol tonight. He is not able to tell me how much. He denies any drug use. He does admit to being depressed and suicidal ideation but cannot verbalize a plan to me. He does state he wants evaluation by behavioral health.  Past Medical History:  Diagnosis Date  . ADHD (attention deficit hyperactivity disorder)   . Anhedonia   . Anxiety   . Anxiety disorder   . Confusion   . Depression   . Disorganized thought process   . Penis disorder   . Sleep apnea    mild no cpap or bipap    Patient Active Problem List   Diagnosis Date Noted  . Bimalleolar ankle fracture 11/18/2015  . Memory loss 03/14/2013  . Confusion   . GAD (generalized anxiety disorder) 09/12/2012    Past Surgical History:  Procedure Laterality Date  . HAND SURGERY    . ORIF ANKLE FRACTURE Left 11/18/2015   Procedure: OPEN REDUCTION INTERNAL FIXATION (ORIF) LEFT ANKLE FRACTURE;  Surgeon: Nadara Mustard, MD;  Location: [facility] OR;  Service: Orthopedics;  Laterality: Left;       Home Medications    Prior to Admission medications   Medication Sig Start Date End Date Taking? Authorizing Provider  aspirin 325 MG EC tablet Take 1,300 mg by mouth every 6 (six) hours as needed for pain.    [provider]  bacitracin ointment Apply 1 application topically 2 (two) times daily. 03/26/16   Trixie Dredge, PA-C  clonazePAM (KLONOPIN) 1 MG tablet Take 1 tablet (1 mg total) by mouth 2 (two) times daily as needed for anxiety. 03/26/16   Trixie Dredge, PA-C   gabapentin (NEURONTIN) 800 MG tablet Take 800 mg by mouth 3 (three) times daily.    [provider]  HYDROcodone-acetaminophen (NORCO/VICODIN) 5-325 MG tablet Take 1 tablet by mouth every 6 (six) hours as needed. Patient taking differently: Take 1 tablet by mouth every 6 (six) hours as needed for moderate pain or severe pain.  11/07/15   Roxy Horseman, PA-C  oxyCODONE-acetaminophen (PERCOCET/ROXICET) 5-325 MG tablet Take 1 tablet by mouth every 6 (six) hours as needed for severe pain. 11/13/15   Sam, Ace Gins, PA-C  testosterone cypionate (DEPOTESTOSTERONE CYPIONATE) 200 MG/ML injection Inject 0.7 mLs into the muscle once a week. 10/27/15   [provider]  venlafaxine XR (EFFEXOR-XR) 75 MG 24 hr capsule Take 3 capsules (225 mg total) by mouth daily with breakfast. Take one pill daily x 4 days, then two pills daily x 4 days, then 3 pills daily. Patient not taking: Reported on 11/07/2015 07/23/14   Trixie Dredge, PA-C  venlafaxine XR (EFFEXOR-XR) 75 MG 24 hr capsule Take 75 mg by mouth daily with breakfast.    [provider]  zolpidem (AMBIEN) 5 MG tablet Take 1 tablet (5 mg total) by mouth at bedtime as needed for sleep. Patient not taking: Reported on 11/07/2015 07/23/14   Trixie Dredge, PA-C    Family History Family History  Problem Relation Age of Onset  . Hypertension Mother   . Diabetes Mother   . Cancer Father   . Arthritis Father   . Diabetes Other     Social History Social History  Substance Use Topics  . Smoking status: Current Every Day Smoker    Packs/day: 1.50    Years: 14.00    Types: Cigarettes  . Smokeless tobacco: Never Used  . Alcohol use 0.0 oz/week     Comment: weekly     Allergies   Adderall [amphetamine-dextroamphetamine]   Review of Systems Review of Systems  Unable to perform ROS: Other     Physical Exam Updated Vital Signs BP 123/72 (BP Location: Left Arm)   Pulse 80   Temp 97.5 F (36.4 C) (Oral)   Resp 19   SpO2 96%    Physical Exam  Nursing note and vitals reviewed.  31 year old male, resting comfortably and in no acute distress. Vital signs are normal. Oxygen saturation is 96%, which is normal. Head is normocephalic and atraumatic. PERRLA, EOMI. Oropharynx is clear. Neck is nontender and supple without adenopathy or JVD. Back is nontender and there is no CVA tenderness. Lungs are clear without rales, wheezes, or rhonchi. Chest is nontender. Heart has regular rate and rhythm without murmur. Abdomen is soft, flat, nontender without masses or hepatosplenomegaly and peristalsis is normoactive. Extremities have no cyanosis or edema, full range of motion is present. Skin is warm and dry without rash. Neurologic: Clinically intoxicated. He is somnolent but arousable. When aroused, speech is slow and dysarthric, cranial nerves are intact, there are no motor or sensory deficits.  ED Treatments / Results  Labs (all labs ordered are listed, but only abnormal results are displayed) Labs Reviewed  COMPREHENSIVE METABOLIC PANEL - Abnormal; Notable for the following:       Result Value   Glucose, Bld 101 (*)    Calcium 8.4 (*)    All other components within normal limits  CBC WITH DIFFERENTIAL/PLATELET  ETHANOL  RAPID URINE DRUG SCREEN, HOSP PERFORMED    Procedures Procedures (including critical care time)  Medications Ordered in ED Medications  zolpidem (AMBIEN) tablet 5 mg (not administered)  ondansetron (ZOFRAN) tablet 4 mg (not administered)  nicotine (NICODERM CQ - dosed in mg/24 hours) patch 21 mg (not administered)  ibuprofen (ADVIL,MOTRIN) tablet 600 mg (not administered)  alum & mag hydroxide-simeth (MAALOX/MYLANTA) 200-200-20 MG/5ML suspension 30 mL (not administered)  acetaminophen (TYLENOL) tablet 650 mg (not administered)  clonazePAM (KLONOPIN) tablet 1 mg (not administered)  gabapentin (NEURONTIN) capsule 800 mg (not administered)  venlafaxine XR (EFFEXOR-XR) 24 hr capsule 75 mg (not  administered)     Initial Impression / Assessment and Plan / ED Course  I have reviewed the triage vital signs and the nursing notes.  Pertinent labs & imaging results that were available during my care of the patient were reviewed by me and considered in my medical decision making (see chart for details).  Alcohol intoxication. Depression. Old records are reviewed, and I see no relevant past visits. Screening labs will be obtained and a TTS consultation will be obtained. However, he will have to metabolize alcohol to a degree where he can hold a coherent conversation before TTS evaluation can be obtained.  Final Clinical Impressions(s) / ED Diagnoses   Final diagnoses:  Acute alcoholic intoxication without complication (HCC)  Suicidal ideation    New Prescriptions New Prescriptions   No medications on file     Dione BoozeGlick, Keimya Briddell, MD 08/26/16  0757  

## 2016-10-12 ENCOUNTER — Emergency Department (HOSPITAL_COMMUNITY)
Admission: EM | Admit: 2016-10-12 | Discharge: 2016-10-12 | Disposition: A | Discharge status: Home / Self Care | Payer: Medicaid Other | Attending: Emergency Medicine | Admitting: Emergency Medicine

## 2016-10-12 ENCOUNTER — Encounter (HOSPITAL_COMMUNITY): Payer: Self-pay | Admitting: Emergency Medicine

## 2016-10-12 ENCOUNTER — Emergency Department (HOSPITAL_COMMUNITY): Payer: Medicaid Other

## 2016-10-12 DIAGNOSIS — Y92410 Unspecified street and highway as the place of occurrence of the external cause: Secondary | ICD-10-CM | POA: Insufficient documentation

## 2016-10-12 DIAGNOSIS — R52 Pain, unspecified: Secondary | ICD-10-CM

## 2016-10-12 DIAGNOSIS — F17210 Nicotine dependence, cigarettes, uncomplicated: Secondary | ICD-10-CM | POA: Insufficient documentation

## 2016-10-12 DIAGNOSIS — Y998 Other external cause status: Secondary | ICD-10-CM | POA: Diagnosis not present

## 2016-10-12 DIAGNOSIS — Y9389 Activity, other specified: Secondary | ICD-10-CM | POA: Insufficient documentation

## 2016-10-12 DIAGNOSIS — S46912A Strain of unspecified muscle, fascia and tendon at shoulder and upper arm level, left arm, initial encounter: Secondary | ICD-10-CM | POA: Diagnosis not present

## 2016-10-12 DIAGNOSIS — S4992XA Unspecified injury of left shoulder and upper arm, initial encounter: Secondary | ICD-10-CM | POA: Diagnosis present

## 2016-10-12 DIAGNOSIS — F909 Attention-deficit hyperactivity disorder, unspecified type: Secondary | ICD-10-CM | POA: Diagnosis not present

## 2016-10-12 DIAGNOSIS — Z79899 Other long term (current) drug therapy: Secondary | ICD-10-CM | POA: Insufficient documentation

## 2016-10-12 MED ORDER — NAPROXEN 500 MG PO TABS
500.0000 mg | ORAL_TABLET | Freq: Two times a day (BID) | ORAL | 0 refills | Status: DC
Start: 2016-10-12 — End: 2018-01-15

## 2016-10-12 NOTE — Discharge Instructions (Signed)
Your xray today was normal. Take naprosyn for pain and inflammation. Sling as needed. Follow up with orthopedics specialist if not improving.
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
## 2016-10-12 NOTE — ED Notes (Signed)
Pt assessed and discharged by Tatyana EDPA 

## 2017-03-07 ENCOUNTER — Encounter (HOSPITAL_COMMUNITY): Payer: Self-pay | Admitting: Emergency Medicine

## 2017-03-07 ENCOUNTER — Emergency Department (HOSPITAL_COMMUNITY)
Admission: EM | Admit: 2017-03-07 | Discharge: 2017-03-08 | Discharge status: Against Medical Advice | Payer: Medicaid Other | Attending: Emergency Medicine | Admitting: Emergency Medicine

## 2017-03-07 DIAGNOSIS — Z5321 Procedure and treatment not carried out due to patient leaving prior to being seen by health care provider: Secondary | ICD-10-CM | POA: Diagnosis not present

## 2017-03-07 NOTE — ED Triage Notes (Signed)
patient brought in voluntarily by [employer] for insomnia and out of medications to help with sleep and anxiety and depression for several days.  Patient denies any SI or HI.

## 2017-03-07 NOTE — ED Notes (Signed)
Bed: [facility] Expected date:  Expected time:  Means of arrival:  Comments: 

## 2017-03-07 NOTE — ED Notes (Signed)
Called for room placement no response. 

## 2017-03-07 NOTE — ED Notes (Signed)
Called pt for reasses vitals no response. 

## 2017-12-15 ENCOUNTER — Emergency Department (HOSPITAL_COMMUNITY)
Admission: EM | Admit: 2017-12-15 | Discharge: 2017-12-15 | Disposition: A | Discharge status: Home / Self Care | Payer: Medicaid Other | Attending: Emergency Medicine | Admitting: Emergency Medicine

## 2017-12-15 ENCOUNTER — Encounter (HOSPITAL_COMMUNITY): Payer: Self-pay | Admitting: Emergency Medicine

## 2017-12-15 ENCOUNTER — Other Ambulatory Visit: Payer: Self-pay

## 2017-12-15 ENCOUNTER — Emergency Department (HOSPITAL_COMMUNITY): Payer: Medicaid Other

## 2017-12-15 DIAGNOSIS — Z79899 Other long term (current) drug therapy: Secondary | ICD-10-CM | POA: Insufficient documentation

## 2017-12-15 DIAGNOSIS — F1910 Other psychoactive substance abuse, uncomplicated: Secondary | ICD-10-CM | POA: Diagnosis not present

## 2017-12-15 DIAGNOSIS — F17210 Nicotine dependence, cigarettes, uncomplicated: Secondary | ICD-10-CM | POA: Insufficient documentation

## 2017-12-15 DIAGNOSIS — F419 Anxiety disorder, unspecified: Secondary | ICD-10-CM | POA: Diagnosis not present

## 2017-12-15 DIAGNOSIS — R0789 Other chest pain: Secondary | ICD-10-CM | POA: Diagnosis present

## 2017-12-15 DIAGNOSIS — F909 Attention-deficit hyperactivity disorder, unspecified type: Secondary | ICD-10-CM | POA: Insufficient documentation

## 2017-12-15 LAB — BASIC METABOLIC PANEL
Anion gap: 10 (ref 5–15)
BUN: 11 mg/dL (ref 6–20)
CALCIUM: 9.5 mg/dL (ref 8.9–10.3)
CO2: 28 mmol/L (ref 22–32)
Chloride: 101 mmol/L (ref 98–111)
Creatinine, Ser: 0.81 mg/dL (ref 0.61–1.24)
GLUCOSE: 99 mg/dL (ref 70–99)
POTASSIUM: 4.3 mmol/L (ref 3.5–5.1)
Sodium: 139 mmol/L (ref 135–145)

## 2017-12-15 LAB — CBC WITH DIFFERENTIAL/PLATELET
BASOS ABS: 0 10*3/uL (ref 0.0–0.1)
BASOS PCT: 0 %
Eosinophils Absolute: 0 10*3/uL (ref 0.0–0.7)
Eosinophils Relative: 0 %
HEMATOCRIT: 46 % (ref 39.0–52.0)
HEMOGLOBIN: 15.8 g/dL (ref 13.0–17.0)
LYMPHS PCT: 16 %
Lymphs Abs: 1.9 10*3/uL (ref 0.7–4.0)
MCH: 32 pg (ref 26.0–34.0)
MCHC: 34.3 g/dL (ref 30.0–36.0)
MCV: 93.1 fL (ref 78.0–100.0)
MONO ABS: 0.7 10*3/uL (ref 0.1–1.0)
Monocytes Relative: 6 %
NEUTROS ABS: 9.4 10*3/uL — AB (ref 1.7–7.7)
NEUTROS PCT: 78 %
Platelets: 285 10*3/uL (ref 150–400)
RBC: 4.94 MIL/uL (ref 4.22–5.81)
RDW: 13.4 % (ref 11.5–15.5)
WBC: 12 10*3/uL — AB (ref 4.0–10.5)

## 2017-12-15 LAB — I-STAT TROPONIN, ED: Troponin i, poc: 0 ng/mL (ref 0.00–0.08)

## 2017-12-15 MED ORDER — LORAZEPAM 2 MG/ML IJ SOLN
1.0000 mg | Freq: Once | INTRAMUSCULAR | Status: AC
  Administered 2017-12-15: 1 mg via INTRAVENOUS
  Filled 2017-12-15: qty 1

## 2017-12-15 NOTE — Discharge Instructions (Addendum)
Follow-up with outpatient substance abuse treatment centers as provided in the attached resource guide.
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
## 2017-12-17 ENCOUNTER — Encounter (HOSPITAL_COMMUNITY): Payer: Self-pay | Admitting: *Deleted

## 2017-12-17 ENCOUNTER — Other Ambulatory Visit: Payer: Self-pay

## 2017-12-17 ENCOUNTER — Emergency Department (HOSPITAL_COMMUNITY)
Admission: EM | Admit: 2017-12-17 | Discharge: 2017-12-17 | Disposition: A | Discharge status: Home / Self Care | Payer: Medicaid Other | Attending: Emergency Medicine | Admitting: Emergency Medicine

## 2017-12-17 DIAGNOSIS — F17210 Nicotine dependence, cigarettes, uncomplicated: Secondary | ICD-10-CM | POA: Insufficient documentation

## 2017-12-17 DIAGNOSIS — R0789 Other chest pain: Secondary | ICD-10-CM | POA: Insufficient documentation

## 2017-12-17 DIAGNOSIS — R05 Cough: Secondary | ICD-10-CM | POA: Insufficient documentation

## 2017-12-17 DIAGNOSIS — F329 Major depressive disorder, single episode, unspecified: Secondary | ICD-10-CM | POA: Insufficient documentation

## 2017-12-17 DIAGNOSIS — R0981 Nasal congestion: Secondary | ICD-10-CM | POA: Diagnosis not present

## 2017-12-17 DIAGNOSIS — R059 Cough, unspecified: Secondary | ICD-10-CM

## 2017-12-17 DIAGNOSIS — F909 Attention-deficit hyperactivity disorder, unspecified type: Secondary | ICD-10-CM | POA: Diagnosis not present

## 2017-12-17 DIAGNOSIS — F419 Anxiety disorder, unspecified: Secondary | ICD-10-CM | POA: Diagnosis not present

## 2017-12-17 DIAGNOSIS — G4700 Insomnia, unspecified: Secondary | ICD-10-CM | POA: Diagnosis present

## 2017-12-17 DIAGNOSIS — Z79899 Other long term (current) drug therapy: Secondary | ICD-10-CM | POA: Insufficient documentation

## 2017-12-17 MED ORDER — LORAZEPAM 1 MG PO TABS
1.0000 mg | ORAL_TABLET | Freq: Once | ORAL | Status: AC
  Administered 2017-12-17: 1 mg via ORAL
  Filled 2017-12-17: qty 1

## 2017-12-17 MED ORDER — DIPHENHYDRAMINE HCL 25 MG PO TABS: 25.0000 mg | ORAL_TABLET | Freq: Four times a day (QID) | ORAL | 0 refills | Status: DC

## 2017-12-17 MED ORDER — BENZONATATE 100 MG PO CAPS: 100.0000 mg | ORAL_CAPSULE | Freq: Three times a day (TID) | ORAL | 0 refills | Status: DC

## 2017-12-17 MED ORDER — IBUPROFEN 600 MG PO TABS: 600.0000 mg | ORAL_TABLET | Freq: Four times a day (QID) | ORAL | 0 refills | Status: DC | PRN

## 2017-12-17 MED ORDER — SALINE SPRAY 0.65 % NA SOLN: 1.0000 | NASAL | 0 refills | Status: DC | PRN
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
## 2017-12-17 NOTE — Discharge Instructions (Signed)
Take Tessalon every 8 hours as needed for your cough.  Take 1-2 25 mg Benadryl tablets every 8 hours as needed for your nasal congestion.  This can also be helpful for sleep.  Use nasal saline as much as needed for your nasal congestion, use at least 3-4 times daily.  Take ibuprofen every 6 hours as needed for your chest pain.  Make sure to take this with food.  Avoid taking with alcohol, as this can increase your risk of GI bleeding.  Use ice 3-4 times daily alternating 20 minutes on, 20 minutes off.  A peer support specialist should be reaching out to you to help you with resources for your interest in drug rehabilitation.  Please talk to your doctor about further management of your anxiety and insomnia with your Klonopin, as well as treatment of any ongoing symptoms we discussed today.  Please return the emergency department if you develop any new or worsening symptoms.

## 2017-12-17 NOTE — ED Notes (Signed)
Med given 

## 2018-01-01 IMAGING — CR DG ANKLE COMPLETE 3+V*L*
3 series · 3 of 3 positions shown · non-contrast
Comparison: Radiographs January 05, 2016.

CLINICAL DATA: Acute left ankle pain without known injury.

EXAM:
LEFT ANKLE COMPLETE - 3+ VIEW

[x ankle ap left]
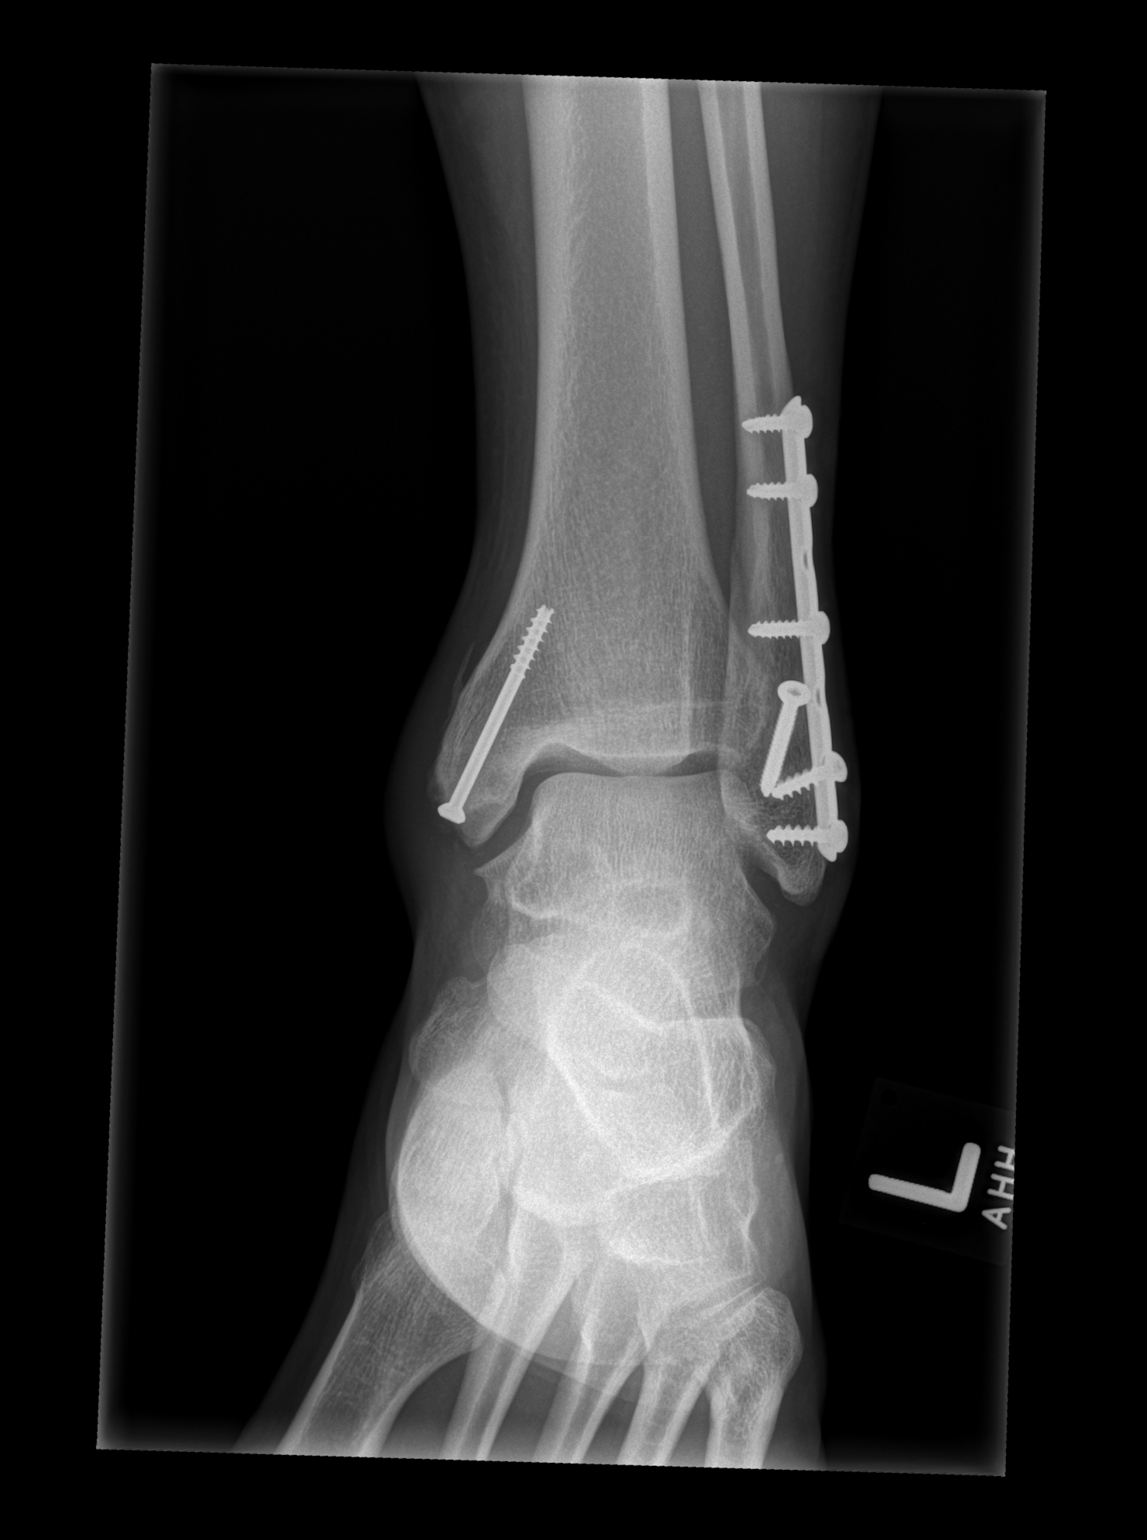

[x ankle obl left]
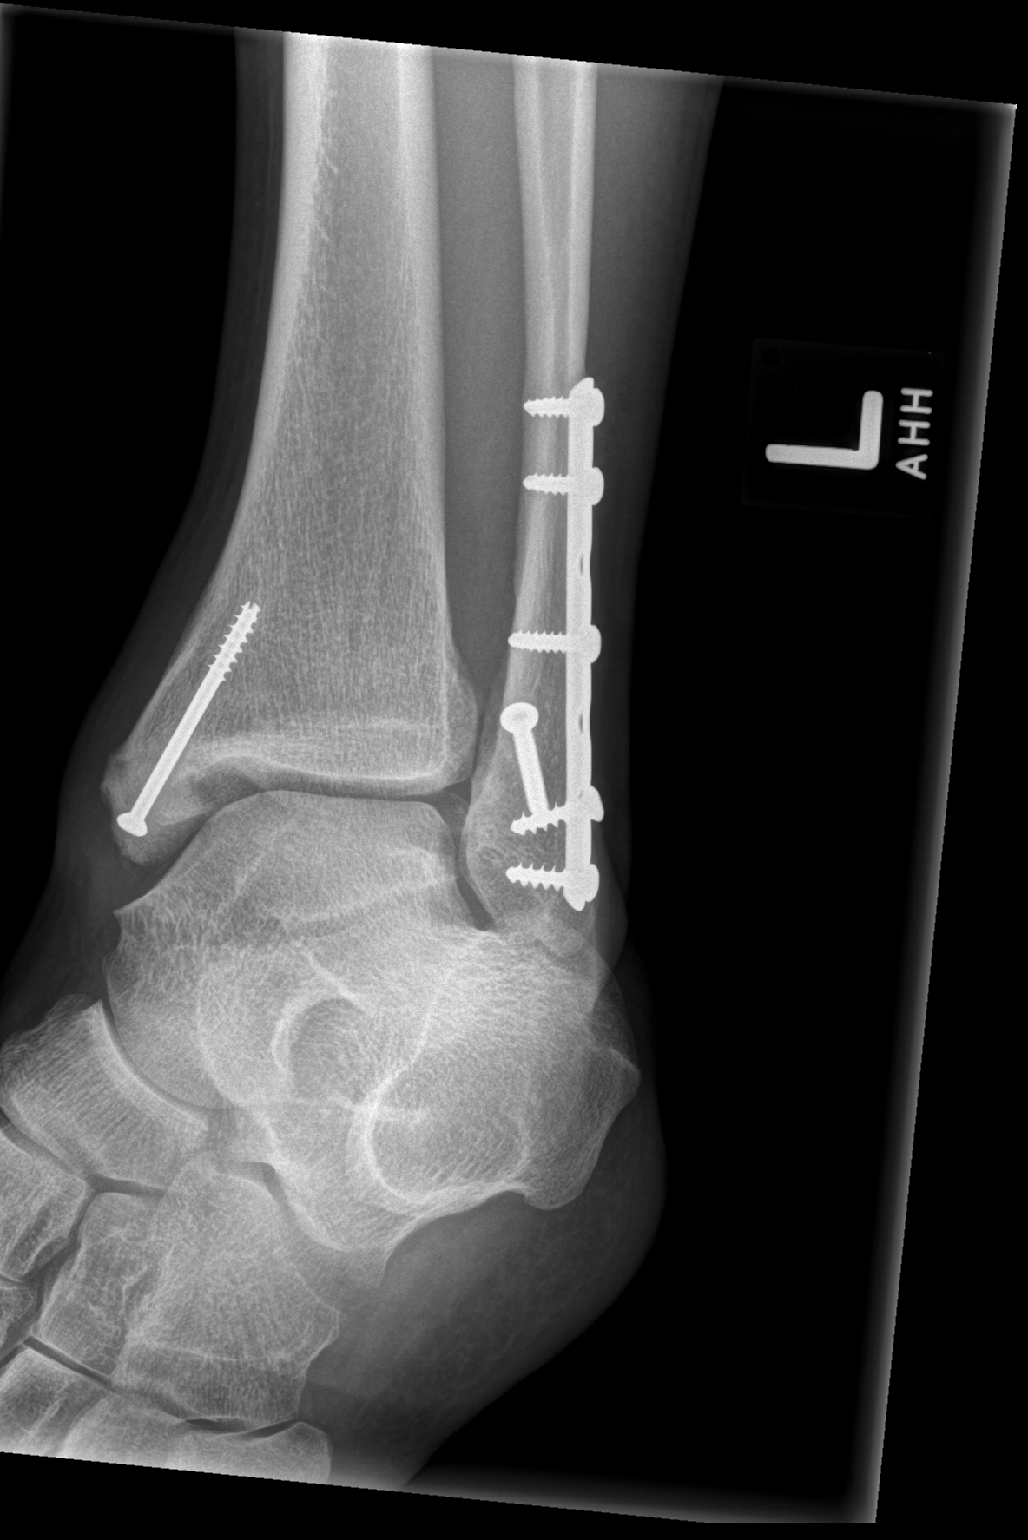

[x ankle lat left]
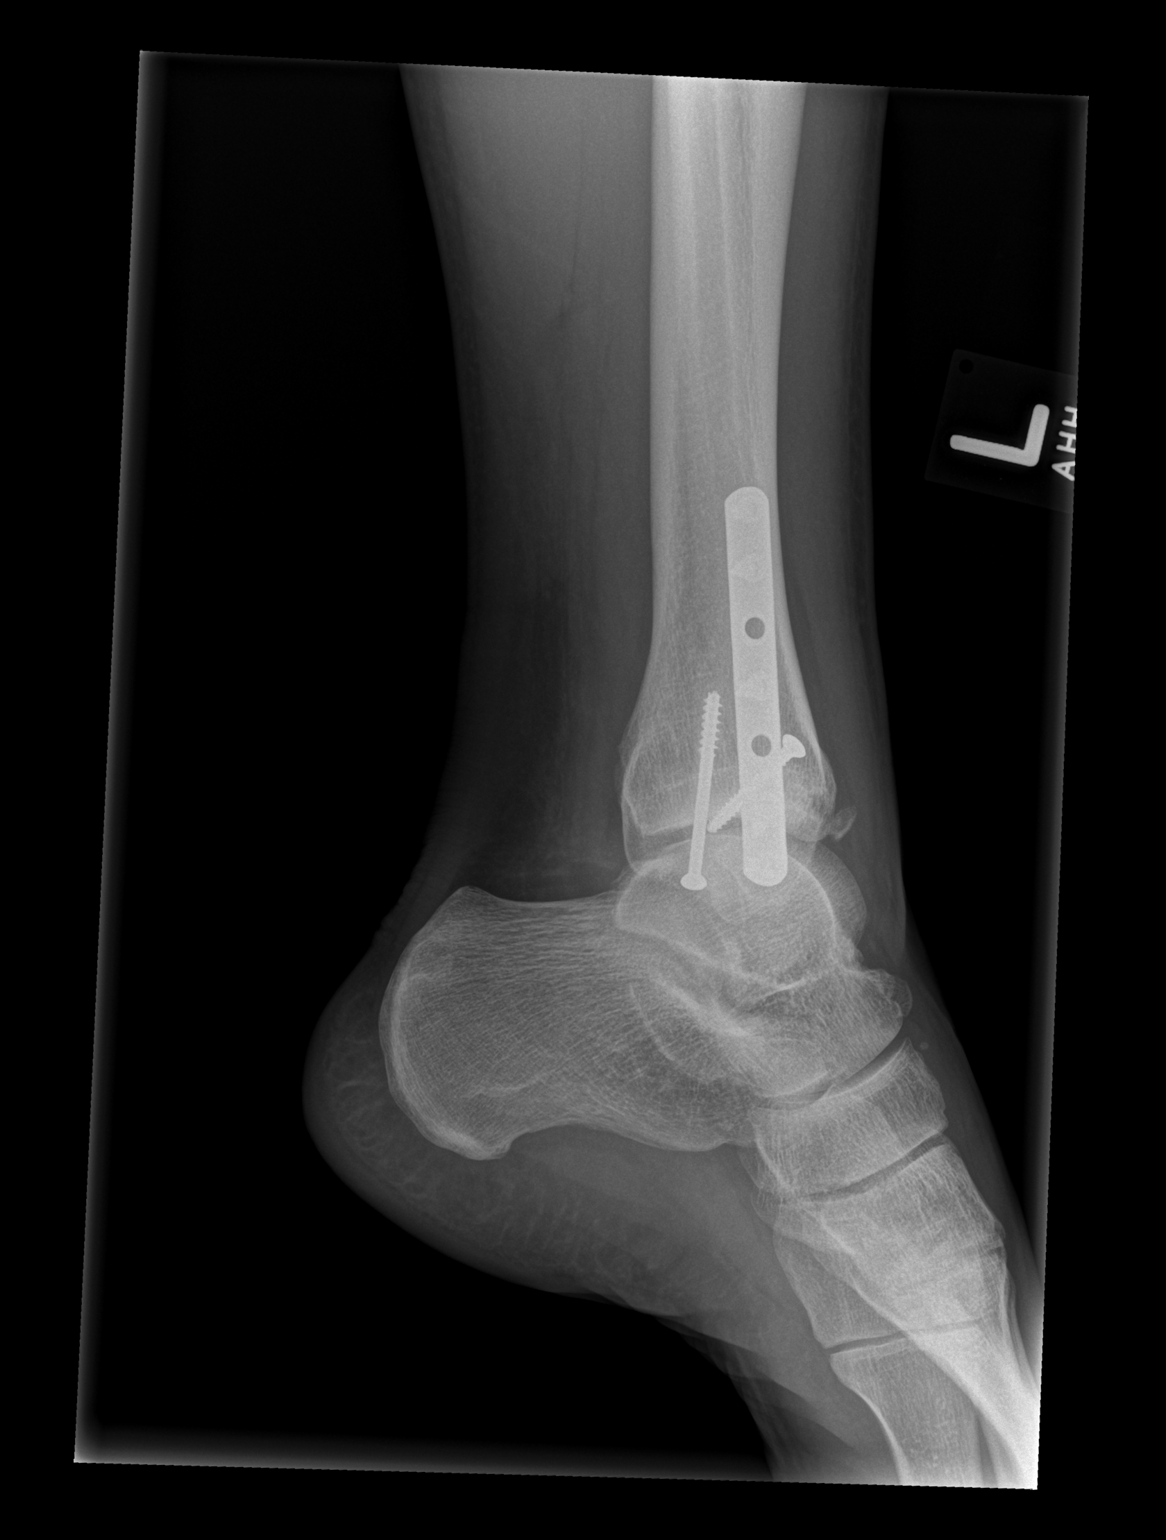

[3 of 3 positions shown; findings below may reference images not displayed]

FINDINGS: Status post surgical internal fixation of distal fibular and medial
malleolar fractures. No acute fracture or dislocation is noted.
Joint spaces are intact. Mild soft tissue swelling is seen overlying
the medial malleolus.
IMPRESSION: Postsurgical changes as described above. No acute fracture or
dislocation is noted. Mild soft tissue swelling is seen overlying
the medial malleolus suggesting ligamentous injury.

## 2018-01-01 IMAGING — CR DG WRIST COMPLETE 3+V*L*
4 series · 4 of 4 positions shown · non-contrast
Comparison: Radiographs July 30, 2010.

CLINICAL DATA: Acute left wrist pain without known injury.

EXAM:
LEFT WRIST - COMPLETE 3+ VIEW

[x wrist pa left]
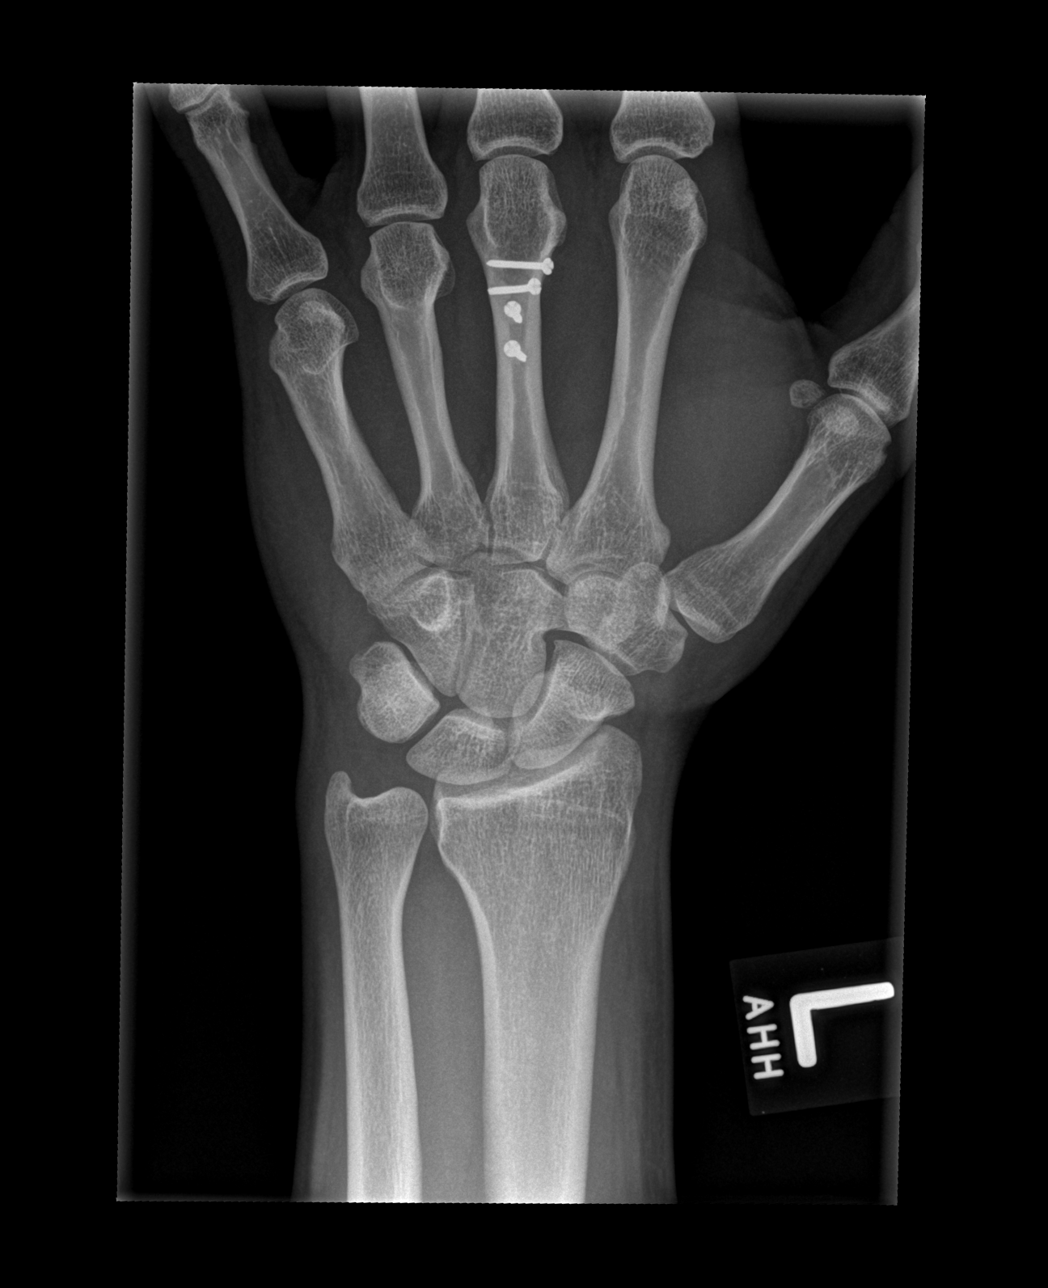

[x wrist obl left]
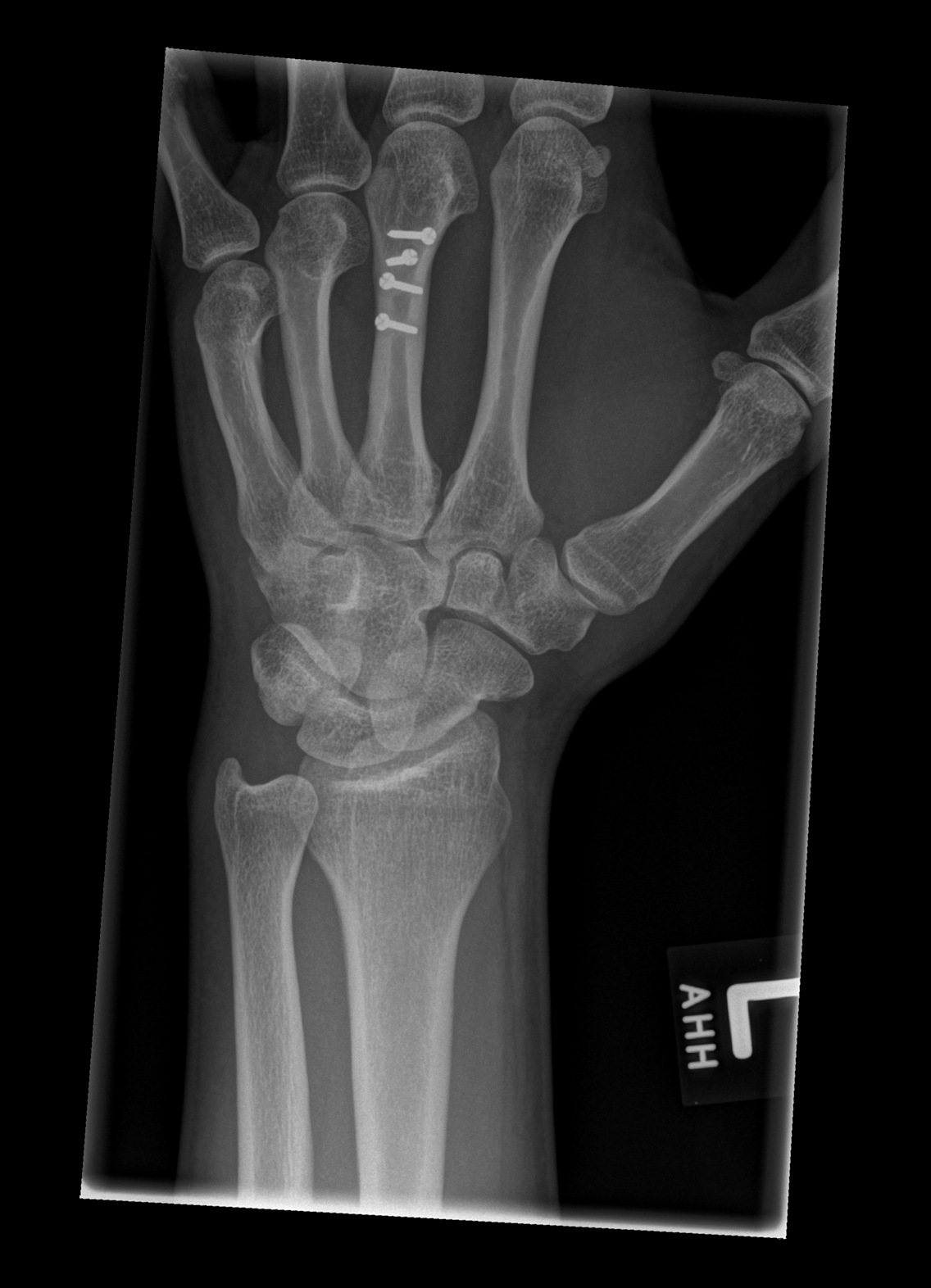

[x wrist lat left]
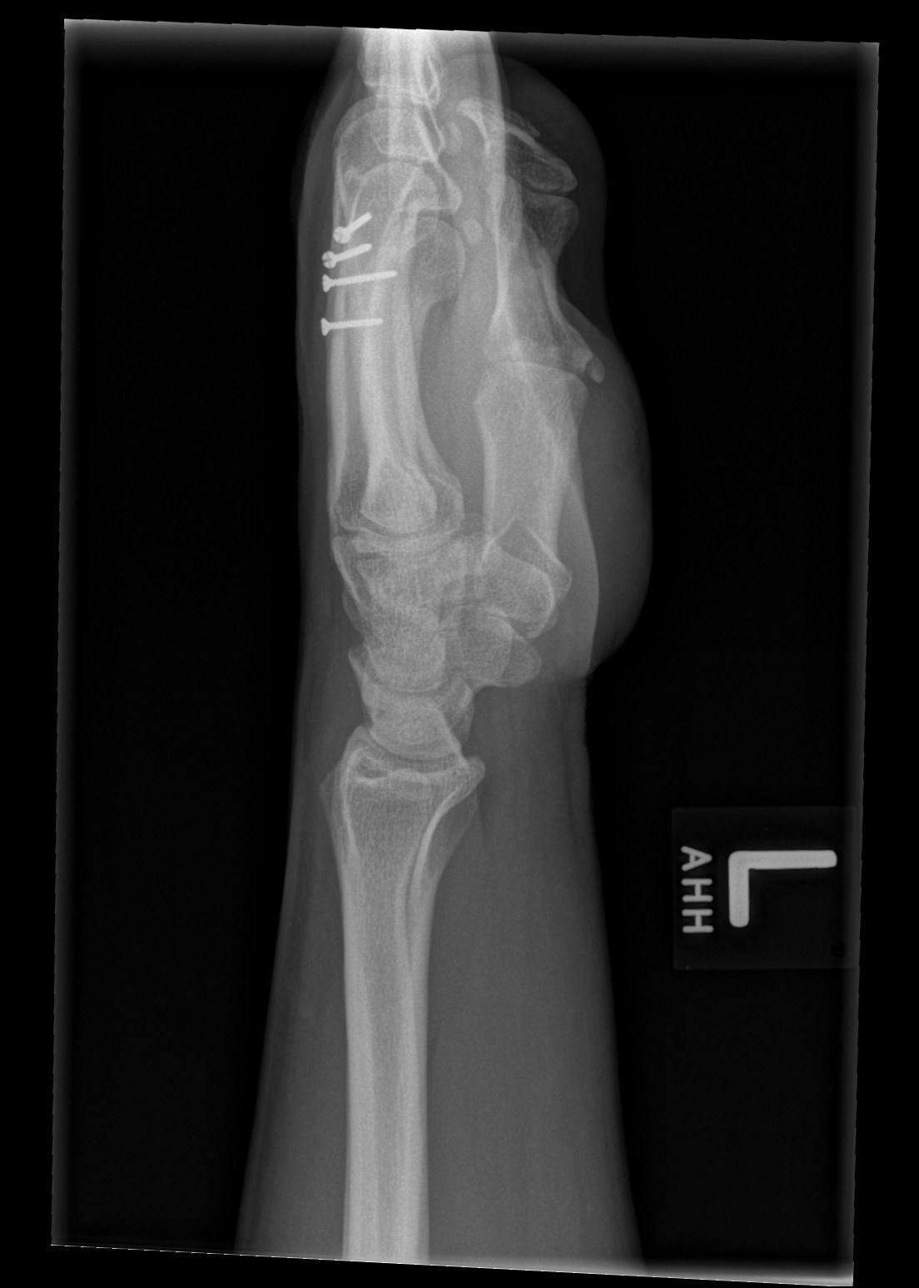

[x wrist navicular view left]
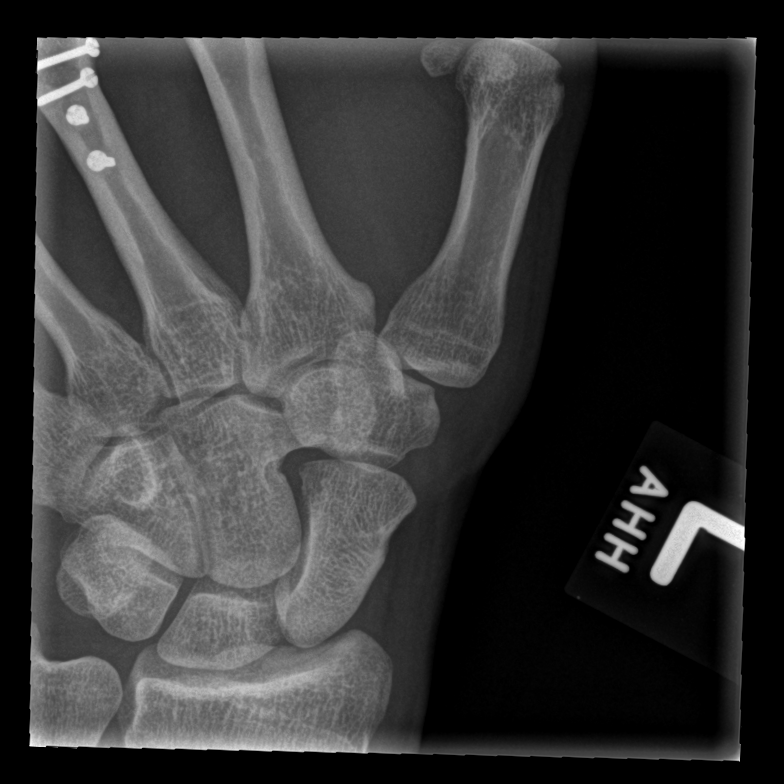

[4 of 4 positions shown; findings below may reference images not displayed]

FINDINGS: There is no evidence of fracture or dislocation. There is no
evidence of arthropathy or other focal bone abnormality. Soft
tissues are unremarkable.
IMPRESSION: Normal left wrist.

## 2018-01-14 ENCOUNTER — Other Ambulatory Visit: Payer: Self-pay

## 2018-01-14 ENCOUNTER — Emergency Department (HOSPITAL_COMMUNITY)
Admission: EM | Admit: 2018-01-14 | Discharge: 2018-01-15 | Disposition: A | Discharge status: Home / Self Care | Payer: Medicaid Other | Attending: Emergency Medicine | Admitting: Emergency Medicine

## 2018-01-14 ENCOUNTER — Encounter (HOSPITAL_COMMUNITY): Payer: Self-pay

## 2018-01-14 DIAGNOSIS — F17210 Nicotine dependence, cigarettes, uncomplicated: Secondary | ICD-10-CM | POA: Insufficient documentation

## 2018-01-14 DIAGNOSIS — Z79899 Other long term (current) drug therapy: Secondary | ICD-10-CM | POA: Diagnosis not present

## 2018-01-14 DIAGNOSIS — M79673 Pain in unspecified foot: Secondary | ICD-10-CM | POA: Diagnosis present

## 2018-01-14 DIAGNOSIS — F419 Anxiety disorder, unspecified: Secondary | ICD-10-CM | POA: Diagnosis not present

## 2018-01-14 DIAGNOSIS — F329 Major depressive disorder, single episode, unspecified: Secondary | ICD-10-CM | POA: Diagnosis not present

## 2018-01-14 DIAGNOSIS — F1510 Other stimulant abuse, uncomplicated: Secondary | ICD-10-CM | POA: Insufficient documentation

## 2018-01-14 NOTE — ED Notes (Signed)
Bed: ZO10 Expected date:  Expected time:  Means of arrival:  Comments: 32 yo M/ foot pain-using meth
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
## 2018-01-15 LAB — RAPID URINE DRUG SCREEN, HOSP PERFORMED
Amphetamines: POSITIVE — AB
BARBITURATES: NOT DETECTED
BENZODIAZEPINES: NOT DETECTED
COCAINE: NOT DETECTED
OPIATES: NOT DETECTED
Tetrahydrocannabinol: NOT DETECTED

## 2018-01-15 LAB — COMPREHENSIVE METABOLIC PANEL
ALBUMIN: 4.4 g/dL (ref 3.5–5.0)
ALT: 26 U/L (ref 0–44)
ANION GAP: 7 (ref 5–15)
AST: 24 U/L (ref 15–41)
Alkaline Phosphatase: 58 U/L (ref 38–126)
BILIRUBIN TOTAL: 0.8 mg/dL (ref 0.3–1.2)
BUN: 18 mg/dL (ref 6–20)
CALCIUM: 9 mg/dL (ref 8.9–10.3)
CO2: 26 mmol/L (ref 22–32)
Chloride: 106 mmol/L (ref 98–111)
Creatinine, Ser: 0.91 mg/dL (ref 0.61–1.24)
GFR calc non Af Amer: >60 mL/min (ref >60)
GLUCOSE: 91 mg/dL (ref 70–99)
POTASSIUM: 3.6 mmol/L (ref 3.5–5.1)
SODIUM: 139 mmol/L (ref 135–145)
TOTAL PROTEIN: 8 g/dL (ref 6.5–8.1)

## 2018-01-15 LAB — CBC WITH DIFFERENTIAL/PLATELET
Abs Immature Granulocytes: 0.05 10*3/uL (ref 0.00–0.07)
BASOS ABS: 0 10*3/uL (ref 0.0–0.1)
BASOS PCT: 0 %
EOS ABS: 0 10*3/uL (ref 0.0–0.5)
EOS PCT: 0 %
HCT: 44.5 % (ref 39.0–52.0)
Hemoglobin: 15.1 g/dL (ref 13.0–17.0)
IMMATURE GRANULOCYTES: 1 %
LYMPHS ABS: 2.7 10*3/uL (ref 0.7–4.0)
Lymphocytes Relative: 27 %
MCH: 31.9 pg (ref 26.0–34.0)
MCHC: 33.9 g/dL (ref 30.0–36.0)
MCV: 94.1 fL (ref 80.0–100.0)
MONOS PCT: 11 %
Monocytes Absolute: 1.1 10*3/uL — ABNORMAL HIGH (ref 0.1–1.0)
Neutro Abs: 6 10*3/uL (ref 1.7–7.7)
Neutrophils Relative %: 61 %
PLATELETS: 244 10*3/uL (ref 150–400)
RBC: 4.73 MIL/uL (ref 4.22–5.81)
RDW: 13.3 % (ref 11.5–15.5)
WBC: 10 10*3/uL (ref 4.0–10.5)
nRBC: 0 % (ref 0.0–0.2)

## 2018-01-15 LAB — ETHANOL: Alcohol, Ethyl (B): <10 mg/dL (ref <10)

## 2018-01-15 MED ORDER — CLONAZEPAM 1 MG PO TABS: 1.0000 mg | ORAL_TABLET | Freq: Every evening | ORAL | 0 refills | Status: DC | PRN

## 2018-01-15 MED ORDER — LORAZEPAM 2 MG/ML IJ SOLN
2.0000 mg | Freq: Once | INTRAMUSCULAR | Status: AC
  Administered 2018-01-15: 2 mg via INTRAVENOUS
  Filled 2018-01-15: qty 1

## 2018-01-15 MED ORDER — VENLAFAXINE HCL ER 150 MG PO CP24: 150.0000 mg | ORAL_CAPSULE | Freq: Every day | ORAL | 0 refills | Status: DC

## 2018-01-15 MED ORDER — LORAZEPAM 2 MG/ML IJ SOLN: 2.0000 mg | Freq: Once | INTRAMUSCULAR | Status: DC

## 2018-01-15 MED ORDER — LISDEXAMFETAMINE DIMESYLATE 30 MG PO CAPS: 30.0000 mg | ORAL_CAPSULE | Freq: Every day | ORAL | 0 refills | Status: DC

## 2018-01-15 MED ORDER — FLUVOXAMINE MALEATE 50 MG PO TABS: 50.0000 mg | ORAL_TABLET | Freq: Every day | ORAL | 0 refills | Status: DC

## 2018-01-15 NOTE — ED Notes (Signed)
Pt made aware that urine sample is needed. Urinal at bedside  

## 2018-01-15 NOTE — Discharge Instructions (Signed)
Please utilize the resources provided for outpatient treatment. Please follow-up with your psychiatrist for appropriate management of your psychiatry conditions.   Substance Abuse Treatment Programs  Intensive Outpatient Programs [facility]     [address]      [place], [place]                   [phone]       [facility] [address] [place] [address] [place], [place] [phone]  [facility]     (Inpatient and outpatient)     [address]           [phone]    [facility] [phone] (Suboxone and Methadone)  [address]      [place], [place] [address]      [phone]       [address] [place], [place] [phone]  [facility] (Outpatient/Inpatient, [facility])    (insurance only) [phone]             [facility] (Groups & Residential) [place], [place] [phone]     [facility]     [address]     [place], [place]      [phone]       [facility] (for caregivers and family) [phone] [address]urell [address] [place], [place] [phone]      Residential Treatment Programs [facility]      [address] [place], [place] [address]  [phone]       [facility] [address] [place], [place] [address] [phone]  [facility] [place]        [phone]       [facility] [phone]  [facility] ([facility]             [address]                                         [place], [place]                                                [phone] or [phone]                               [facility] [address] [place], [address] [phone]  [facility]    [address]      [place], [place]     [phone]       [facility] [address] [place], [place] [phone]  [facility]   [address] [place], [place] [address]     [phone]      Admissions: 8am-3pm M-F  [facility] ([facility]) [address] [place], [place] [phone]  [facility]: Residential Program [phone] Days)   [place], [place]      [phone] or [phone]     [facility]: [facility] [place], [place] (Walk in Hours over the weekend or by referral)  [facility] [address] [place], [place], [place] [address] [phone]  Crisis Mobile: [facility]:  [phone] (for crisis response  24 hours a day) [place]:      [phone] Outpatient Psychiatry and Counseling  [facility]: Mobile Crisis Management 24 hours:  [phone]  [facility] of the [employer] sliding scale fee and walk in schedule: M-F 8am-12pm/1pm-3pm [place], [place] [address] [address] [place], [place] [address] [phone]  [facility] ([facility] [employer])- new patient walk-in appointments available Monday - Friday 8am -3pm.          [address] [place], [place] [address] [phone] or crisis line- [facility]/ Intensive Outpatient Therapy Program [place], [place] [address] [place]      [phone] [address] [place], [place] [address]                 [place]   [facility] [phone]. [place], [place] [address]   [employer] [facility]          [address] Johnette Abraham  [place], [place] [address]       [phone]  [facility] [address], [place], [place] [address] [phone]  [facility]    [address] [place], [place]  [address]     [place], [place] Joycelyn Man     [place] [address]     [phone]       [facility] [place] [address]  [facility]     [address] [place], [place], [place], [place] [address] [place]     [address]     [place], [place] [address]     [phone]       [facility] [address] [place], [place] [address] [phone] Resources for Temporary Residential Assistance/Crisis [place] [facility]) M-F 8am-3pm   [address], [place] [address]   [phone] Services include: laundry, barbering, support groups, case management, phone  & computer access, showers, AA/NA mtgs, mental health/substance abuse nurse, job skills class, disability information, VA assistance, spiritual classes, etc.   [facility]   [address] [place]     [place]              [employer] (women and children)       [place]. [place], [place] [address] [phone] [email or web address] for application and process Application Required  [facility]   [address]    [place] [address]     [phone]                    [place], [place] [address] [id number] application appt.) Application Required  [employer] (women only)  [place], [place] [address]     [phone]      Intake starts 6pm daily Need valid ID, SSC, & Police report [employer] [address] [place], [place] [id number] Application Required  [employer] (men only)     [place]       [place], [place]       [place] (Pregnant women only) [address] [place], [place]  [facility]      [employer] [employer] [address]      [place], [place] [address]     [phone]             [facility] [address] [place], [place] 90 day commitment/SA/Application process  [facility](men only)     [address]     [place], [place]       Check-in at [facility] [address] [place], [place] [address] [phone] Men/Women/Women and Children must be there by 7 pm  [place], [place]

## 2018-01-15 NOTE — ED Provider Notes (Signed)
[facility]-EMERGENCY DEPT Provider Note   CSN: [id number] Arrival date & time: 01/14/18  2342     History   Chief Complaint Chief Complaint  Patient presents with  . Foot Pain    HPI Ricky Wu is a 32 y.o. male.  HPI  32 year old male with history of ADHD, depression, anxiety comes in with chief complaint of feet pain.  Patient also has history of meth abuse.  Patient reports that about 3 hours after his meth use he started having funny feeling in his feet.  He describes the symptoms as tingling type pain.  Once the pain started he started having anxiety attack and EMS was called.  Patient is unsure if his symptoms are because of meth use or anxiety.  He states that he has been out of his depression, ADHD and anxiety medication which has led to increased meth use.  Patient is requesting that he be sent to a rehab facility.  He denies any SI, HI. Pt denies nausea, emesis, fevers, chills, chest pains, shortness of breath, headaches, abdominal pain.   Past Medical History:  Diagnosis Date  . ADHD (attention deficit hyperactivity disorder)   . Anhedonia   . Anxiety   . Anxiety disorder   . Confusion   . Depression   . Disorganized thought process   . Penis disorder   . Sleep apnea    mild no cpap or bipap    Patient Active Problem List   Diagnosis Date Noted  . Alcohol abuse 08/26/2016  . Bimalleolar ankle fracture 11/18/2015  . Memory loss 03/14/2013  . Confusion   . GAD (generalized anxiety disorder) 09/12/2012    Past Surgical History:  Procedure Laterality Date  . FRACTURE SURGERY    . HAND SURGERY    . ORIF ANKLE FRACTURE Left 11/18/2015   Procedure: OPEN REDUCTION INTERNAL FIXATION (ORIF) LEFT ANKLE FRACTURE;  Surgeon: Nadara Mustard, MD;  Location: [facility] OR;  Service: Orthopedics;  Laterality: Left;        Home Medications    Prior to Admission medications   Medication Sig Start Date End Date Taking? Authorizing Provider    gabapentin (NEURONTIN) 600 MG tablet Take 600 mg by mouth 4 (four) times daily as needed (For anxiety.).  08/24/16  Yes [provider]  benzonatate (TESSALON) 100 MG capsule Take 1 capsule (100 mg total) by mouth every 8 (eight) hours. Patient not taking: Reported on 01/15/2018 12/17/17   Emi Holes, PA-C  clonazePAM (KLONOPIN) 1 MG tablet Take 1 tablet (1 mg total) by mouth at bedtime as needed for anxiety. 01/15/18   Derwood Kaplan, MD  diphenhydrAMINE (BENADRYL) 25 MG tablet Take 1 tablet (25 mg total) by mouth every 6 (six) hours. Patient not taking: Reported on 01/15/2018 12/17/17   Emi Holes, PA-C  fluvoxaMINE (LUVOX) 50 MG tablet Take 1 tablet (50 mg total) by mouth at bedtime. 01/15/18   Derwood Kaplan, MD  gabapentin (NEURONTIN) 300 MG capsule Take 1 capsule (300 mg total) by mouth 3 (three) times daily. Patient not taking: Reported on 12/15/2017 08/26/16   Charm Rings, NP  ibuprofen (ADVIL,MOTRIN) 600 MG tablet Take 1 tablet (600 mg total) by mouth every 6 (six) hours as needed. Patient not taking: Reported on 01/15/2018 12/17/17   Emi Holes, PA-C  lisdexamfetamine (VYVANSE) 30 MG capsule Take 1 capsule (30 mg total) by mouth daily. 01/15/18   Derwood Kaplan, MD  sodium chloride (OCEAN) 0.65 % SOLN nasal spray  Place 1 spray into both nostrils as needed for congestion. Patient not taking: Reported on 01/15/2018 12/17/17   Emi Holes, PA-C  venlafaxine XR (EFFEXOR-XR) 150 MG 24 hr capsule Take 1 capsule (150 mg total) by mouth daily with breakfast. 01/15/18   Derwood Kaplan, MD    Family History Family History  Problem Relation Age of Onset  . Hypertension Mother   . Diabetes Mother   . Cancer Father   . Arthritis Father   . Diabetes Other     Social History Social History   Tobacco Use  . Smoking status: Current Every Day Smoker    Packs/day: 1.50    Years: 14.00    Pack years: 21.00    Types: Cigarettes  . Smokeless tobacco: Never  Used  Substance Use Topics  . Alcohol use: Yes    Comment: weekly  . Drug use: Yes    Types: Methamphetamines     Allergies   Adderall [amphetamine-dextroamphetamine]   Review of Systems Review of Systems  Constitutional: Positive for activity change.  Respiratory: Negative for shortness of breath.   Cardiovascular: Negative for chest pain.  Gastrointestinal: Negative for nausea and vomiting.  Neurological: Positive for numbness.  All other systems reviewed and are negative.    Physical Exam Updated Vital Signs BP (!) 146/87   Pulse 93   Temp 98.2 F (36.8 C) (Oral)   Resp 20   Ht 6\' 1"  (1.854 m)   Wt 99.8 kg   SpO2 98%   BMI 29.03 kg/m   Physical Exam  Constitutional: He is oriented to person, place, and time. He appears well-developed.  HENT:  Head: Atraumatic.  Neck: Neck supple.  Cardiovascular: Normal rate.  Pulmonary/Chest: Effort normal.  Abdominal: Soft. There is no tenderness.  Musculoskeletal: He exhibits no edema, tenderness or deformity.  Neurological: He is alert and oriented to person, place, and time.  Skin: Skin is warm.  Psychiatric: Judgment normal.  Pressured speech  Nursing note and vitals reviewed.    ED Treatments / Results  Labs (all labs ordered are listed, but only abnormal results are displayed) Labs Reviewed  RAPID URINE DRUG SCREEN, HOSP PERFORMED - Abnormal; Notable for the following components:      Result Value   Amphetamines POSITIVE (*)    All other components within normal limits  CBC WITH DIFFERENTIAL/PLATELET - Abnormal; Notable for the following components:   Monocytes Absolute 1.1 (*)    All other components within normal limits  COMPREHENSIVE METABOLIC PANEL  ETHANOL    EKG EKG Interpretation  Date/Time:  Monday January 15 2018 00:33:53 EDT Ventricular Rate:  83 PR Interval:    QRS Duration: 96 QT Interval:  389 QTC Calculation: 458 R Axis:   39 Text Interpretation:  Sinus rhythm No acute changes No  significant change since last tracing Confirmed by Derwood Kaplan ([id number]) on 01/15/2018 2:05:55 AM   Radiology No results found.  Procedures Procedures (including critical care time)  Medications Ordered in ED Medications  LORazepam (ATIVAN) injection 2 mg (2 mg Intravenous Given 01/15/18 0046)     Initial Impression / Assessment and Plan / ED Course  I have reviewed the triage vital signs and the nursing notes.  Pertinent labs & imaging results that were available during my care of the patient were reviewed by me and considered in my medical decision making (see chart for details).  Clinical Course as of Jan 16 355  Mon Jan 15, 2018  6045 Patient was given 2  mg of IV Ativan.  His heart rate is improved over time and on reassessment he denies any tingling in his legs. I suspect that his symptoms were because of his meth use. Patient is stable for discharge.  I will give him 2 weeks worth of supply of his psych medications and have him follow-up with outpatient rehab facility and his psych physician.   [AN]    Clinical Course User Index [AN] Derwood Kaplan, MD      Final Clinical Impressions(s) / ED Diagnoses   Final diagnoses:  Methamphetamine abuse (HCC)   32 year old male with multiple psychiatry conditions comes in with chief complaint of feet tingling.  He also has meth use history and reports that he started having symptoms about 3 hours after his meth use.  On exam patient does not have any focal and objective neurologic deficits.  He is having subjective paresthesias in his feet, which could be a sequel of his anxiety or the [facility] itself.  Patient is on gabapentin -but is unsure why.  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 We will give him some Ativan. Patient does not have any SI, HI.   ED Discharge Orders         Ordered    lisdexamfetamine (VYVANSE) 30 MG capsule  Daily     01/15/18 0349    venlafaxine XR (EFFEXOR-XR) 150 MG 24 hr capsule  Daily  with breakfast     01/15/18 0349    clonazePAM (KLONOPIN) 1 MG tablet  At bedtime PRN     01/15/18 0349    fluvoxaMINE (LUVOX) 50 MG tablet  Daily at bedtime     01/15/18 0349           Derwood Kaplan, MD 01/15/18 [phone]

## 2018-01-15 NOTE — ED Notes (Signed)
Pt stated that he has been doing meth for the past few days and the pain occurring in his legs/feet happens every time that he does meth more than once. Pt describes the pain as feeling like his veins are popping. Pt appears restless and states that moving and making noise helps distract him from the sensation in his legs.

## 2018-02-01 ENCOUNTER — Other Ambulatory Visit: Payer: Self-pay

## 2018-02-01 ENCOUNTER — Emergency Department (HOSPITAL_COMMUNITY)
Admission: EM | Admit: 2018-02-01 | Discharge: 2018-02-01 | Disposition: A | Discharge status: Home / Self Care | Payer: Medicaid Other | Attending: Emergency Medicine | Admitting: Emergency Medicine

## 2018-02-01 ENCOUNTER — Encounter (HOSPITAL_COMMUNITY): Payer: Self-pay

## 2018-02-01 DIAGNOSIS — F17210 Nicotine dependence, cigarettes, uncomplicated: Secondary | ICD-10-CM | POA: Diagnosis not present

## 2018-02-01 DIAGNOSIS — F1510 Other stimulant abuse, uncomplicated: Secondary | ICD-10-CM | POA: Diagnosis not present

## 2018-02-01 DIAGNOSIS — Z79899 Other long term (current) drug therapy: Secondary | ICD-10-CM | POA: Insufficient documentation

## 2018-02-01 DIAGNOSIS — F410 Panic disorder [episodic paroxysmal anxiety] without agoraphobia: Secondary | ICD-10-CM | POA: Insufficient documentation

## 2018-02-01 DIAGNOSIS — F1599 Other stimulant use, unspecified with unspecified stimulant-induced disorder: Secondary | ICD-10-CM | POA: Diagnosis present

## 2018-02-01 LAB — I-STAT CHEM 8, ED
BUN: 19 mg/dL (ref 6–20)
CALCIUM ION: 1.18 mmol/L (ref 1.15–1.40)
Chloride: 98 mmol/L (ref 98–111)
Creatinine, Ser: 1.1 mg/dL (ref 0.61–1.24)
Glucose, Bld: 109 mg/dL — ABNORMAL HIGH (ref 70–99)
HEMATOCRIT: 49 % (ref 39.0–52.0)
HEMOGLOBIN: 16.7 g/dL (ref 13.0–17.0)
Potassium: 4.4 mmol/L (ref 3.5–5.1)
SODIUM: 139 mmol/L (ref 135–145)
TCO2: 31 mmol/L (ref 22–32)

## 2018-02-01 LAB — I-STAT TROPONIN, ED: Troponin i, poc: 0 ng/mL (ref 0.00–0.08)

## 2018-02-01 MED ORDER — LORAZEPAM 1 MG PO TABS
1.0000 mg | ORAL_TABLET | Freq: Once | ORAL | Status: AC
  Administered 2018-02-01: 1 mg via ORAL
  Filled 2018-02-01: qty 1

## 2018-02-01 NOTE — ED Notes (Signed)
Patient given soup and coke to drink. Denies any other needs at this time.

## 2018-02-01 NOTE — ED Notes (Signed)
Bed: RESA Expected date:  Expected time:  Means of arrival:  Comments: Amphetamine overdose

## 2018-02-01 NOTE — ED Provider Notes (Signed)
[facility]-EMERGENCY DEPT Provider Note   CSN: [id number] Arrival date & time: 02/01/18  0050     History   Chief Complaint Chief Complaint  Patient presents with  . Drug Overdose    HPI Ricky Wu is a 32 y.o. male.   32 year old male with a history of ADHD, anhedonia, anxiety and depression presents to the emergency department for evaluation of discomfort in his neck and head after drinking coffee tonight.  He had previously been using around 20 tablets of Vyvanse over the course of the day.  Reports initially taking 6 to 7 tablets and then taking an additional 1 to 2 tablets every 1-2 hours to maintain a "high".  States that he feels much better since arriving in the ED.  Does report that his symptoms feel similar to prior anxiety attacks.  He has not had any of his Klonopin or his other daily prescribed medications as he does not like mixing his psychiatric medications when he is misusing his Vyvanse.  No SI or HI.     Past Medical History:  Diagnosis Date  . ADHD (attention deficit hyperactivity disorder)   . Anhedonia   . Anxiety   . Anxiety disorder   . Confusion   . Depression   . Disorganized thought process   . Penis disorder   . Sleep apnea    mild no cpap or bipap    Patient Active Problem List   Diagnosis Date Noted  . Alcohol abuse 08/26/2016  . Bimalleolar ankle fracture 11/18/2015  . Memory loss 03/14/2013  . Confusion   . GAD (generalized anxiety disorder) 09/12/2012    Past Surgical History:  Procedure Laterality Date  . FRACTURE SURGERY    . HAND SURGERY    . ORIF ANKLE FRACTURE Left 11/18/2015   Procedure: OPEN REDUCTION INTERNAL FIXATION (ORIF) LEFT ANKLE FRACTURE;  Surgeon: Nadara Mustard, MD;  Location: [facility] OR;  Service: Orthopedics;  Laterality: Left;        Home Medications    Prior to Admission medications   Medication Sig Start Date End Date Taking? Authorizing Provider  benzonatate (TESSALON) 100 MG  capsule Take 1 capsule (100 mg total) by mouth every 8 (eight) hours. Patient not taking: Reported on 01/15/2018 12/17/17   Emi Holes, PA-C  clonazePAM (KLONOPIN) 1 MG tablet Take 1 tablet (1 mg total) by mouth at bedtime as needed for anxiety. 01/15/18   Derwood Kaplan, MD  diphenhydrAMINE (BENADRYL) 25 MG tablet Take 1 tablet (25 mg total) by mouth every 6 (six) hours. Patient not taking: Reported on 01/15/2018 12/17/17   Emi Holes, PA-C  fluvoxaMINE (LUVOX) 50 MG tablet Take 1 tablet (50 mg total) by mouth at bedtime. 01/15/18   Derwood Kaplan, MD  gabapentin (NEURONTIN) 300 MG capsule Take 1 capsule (300 mg total) by mouth 3 (three) times daily. Patient not taking: Reported on 12/15/2017 08/26/16   Charm Rings, NP  gabapentin (NEURONTIN) 600 MG tablet Take 600 mg by mouth 4 (four) times daily as needed (For anxiety.).  08/24/16   [provider]  ibuprofen (ADVIL,MOTRIN) 600 MG tablet Take 1 tablet (600 mg total) by mouth every 6 (six) hours as needed. Patient not taking: Reported on 01/15/2018 12/17/17   Emi Holes, PA-C  lisdexamfetamine (VYVANSE) 30 MG capsule Take 1 capsule (30 mg total) by mouth daily. 01/15/18   Derwood Kaplan, MD  sodium chloride (OCEAN) 0.65 % SOLN nasal spray Place 1 spray into both  nostrils as needed for congestion. Patient not taking: Reported on 01/15/2018 12/17/17   Emi Holes, PA-C  venlafaxine XR (EFFEXOR-XR) 150 MG 24 hr capsule Take 1 capsule (150 mg total) by mouth daily with breakfast. 01/15/18   Derwood Kaplan, MD    Family History Family History  Problem Relation Age of Onset  . Hypertension Mother   . Diabetes Mother   . Cancer Father   . Arthritis Father   . Diabetes Other     Social History Social History   Tobacco Use  . Smoking status: Current Every Day Smoker    Packs/day: 1.50    Years: 14.00    Pack years: 21.00    Types: Cigarettes  . Smokeless tobacco: Never Used  Substance Use Topics  .  Alcohol use: Yes    Comment: weekly  . Drug use: Yes    Types: Methamphetamines     Allergies   Adderall [amphetamine-dextroamphetamine]   Review of Systems Review of Systems Ten systems reviewed and are negative for acute change, except as noted in the HPI.    Physical Exam Updated Vital Signs BP (!) 139/92 (BP Location: Left Arm)   Pulse 92   Resp (!) 23   Ht 6\' 1"  (1.854 m)   Wt 99.8 kg   SpO2 98%   BMI 29.03 kg/m   Physical Exam  Constitutional: He is oriented to person, place, and time. He appears well-developed and well-nourished. No distress.  Nontoxic appearing and in NAD  HENT:  Head: Normocephalic and atraumatic.  Eyes: Conjunctivae and EOM are normal. No scleral icterus.  Neck: Normal range of motion.  Cardiovascular: Regular rhythm and intact distal pulses.  Mild tachycardia  Pulmonary/Chest: Effort normal. No respiratory distress.  Respirations even and unlabored  Musculoskeletal: Normal range of motion.  Neurological: He is alert and oriented to person, place, and time. He exhibits normal muscle tone. Coordination normal.  GCS 15.  Speech clear.  Moving all extremities spontaneously  Skin: Skin is warm and dry. No rash noted. He is not diaphoretic. No erythema. No pallor.  Psychiatric: His behavior is normal. His mood appears anxious. His speech is rapid and/or pressured.  Nursing note and vitals reviewed.    ED Treatments / Results  Labs (all labs ordered are listed, but only abnormal results are displayed) Labs Reviewed  I-STAT CHEM 8, ED - Abnormal; Notable for the following components:      Result Value   Glucose, Bld 109 (*)    All other components within normal limits  I-STAT TROPONIN, ED    EKG ED ECG REPORT   Date: 02/01/2018  Rate: 90  Rhythm: normal sinus rhythm  QRS Axis: normal  Intervals: normal  ST/T Wave abnormalities: normal  Conduction Disutrbances:none  Narrative Interpretation: NSR; QTc normal at 421  Old EKG  Reviewed: none available  I have personally reviewed the EKG tracing and agree with the computerized printout as noted.   Radiology No results found.  Procedures Procedures (including critical care time)  Medications Ordered in ED Medications  LORazepam (ATIVAN) tablet 1 mg (1 mg Oral Given 02/01/18 0134)     Initial Impression / Assessment and Plan / ED Course  I have reviewed the triage vital signs and the nursing notes.  Pertinent labs & imaging results that were available during my care of the patient were reviewed by me and considered in my medical decision making (see chart for details).     32 year old male presents to the emergency  department for evaluation of discomfort in his chest and neck with heightened anxiety consistent with panic attack precipitated by excessive/missed use of Vyvanse.  Reports taking between 15 and 20 tablets of 30 mg Vyvanse in the past 24 hours to try and get high.  No suicidal or homicidal thoughts.  He was given 1 tablet of Ativan in the ED for management of anxiety.  His vital signs have remained stable.  Poison control recommending observation for at least 4 hours.  He has been hemodynamically stable in the emergency department without clinical decompensation.  Eating and drinking well.  Laboratory work-up reassuring.  I do not believe further emergent work-up or observation is indicated.  The patient has been instructed to take his medications only as prescribed.  I have encouraged follow-up with his primary care doctor as well as his therapist/psychiatrist.  Return precautions discussed and provided. Patient discharged in stable condition with no unaddressed concerns.   Vitals:   02/01/18 0330 02/01/18 0418 02/01/18 0430 02/01/18 0504  BP: (!) 158/107 (!) 145/101 (!) 163/113 (!) 139/92  Pulse: (!) 103 89 (!) 104 92  Resp: (!) 30 17 (!) 22 (!) 23  SpO2: 97% 99% 97% 98%  Weight:      Height:        Final Clinical Impressions(s) / ED  Diagnoses   Final diagnoses:  Amphetamine abuse [facility])  Panic attack    ED Discharge Orders    None       Antony Madura, PA-C 02/01/18 0549    Molpus, Jonny Ruiz, MD 02/01/18 [phone]

## 2018-02-01 NOTE — ED Triage Notes (Addendum)
Per EMS, patient took approximately 20 Vyvanse (30 mg) over the course of today. He had the last dose 2 hours ago. He normally takes 7 or 8 at a time and then 2-3 every couple of hours.   He called EMS because he was experiencing a panic attack and stated that his "head feels funny"  He normally gets a euphoric feeling from the medication, but states that this feels different than that.

## 2018-02-01 NOTE — Discharge Instructions (Addendum)
Take your medications only as prescribed.  Follow-up with your primary care doctor and therapist/psychiatrist.  You may return for new or concerning symptoms.

## 2018-03-18 ENCOUNTER — Emergency Department (HOSPITAL_COMMUNITY): Payer: Medicaid Other

## 2018-03-18 ENCOUNTER — Encounter (HOSPITAL_COMMUNITY): Payer: Self-pay

## 2018-03-18 ENCOUNTER — Emergency Department (HOSPITAL_COMMUNITY)
Admission: EM | Admit: 2018-03-18 | Discharge: 2018-03-18 | Disposition: A | Discharge status: Home / Self Care | Payer: Medicaid Other | Attending: Emergency Medicine | Admitting: Emergency Medicine

## 2018-03-18 ENCOUNTER — Other Ambulatory Visit: Payer: Self-pay

## 2018-03-18 DIAGNOSIS — F17210 Nicotine dependence, cigarettes, uncomplicated: Secondary | ICD-10-CM | POA: Insufficient documentation

## 2018-03-18 DIAGNOSIS — J342 Deviated nasal septum: Secondary | ICD-10-CM | POA: Diagnosis not present

## 2018-03-18 DIAGNOSIS — S5001XA Contusion of right elbow, initial encounter: Secondary | ICD-10-CM | POA: Diagnosis not present

## 2018-03-18 DIAGNOSIS — Y939 Activity, unspecified: Secondary | ICD-10-CM | POA: Insufficient documentation

## 2018-03-18 DIAGNOSIS — S59901A Unspecified injury of right elbow, initial encounter: Secondary | ICD-10-CM | POA: Diagnosis present

## 2018-03-18 DIAGNOSIS — W19XXXA Unspecified fall, initial encounter: Secondary | ICD-10-CM | POA: Insufficient documentation

## 2018-03-18 DIAGNOSIS — Y929 Unspecified place or not applicable: Secondary | ICD-10-CM | POA: Diagnosis not present

## 2018-03-18 DIAGNOSIS — R1010 Upper abdominal pain, unspecified: Secondary | ICD-10-CM | POA: Diagnosis not present

## 2018-03-18 DIAGNOSIS — Y999 Unspecified external cause status: Secondary | ICD-10-CM | POA: Diagnosis not present

## 2018-03-18 DIAGNOSIS — Z79899 Other long term (current) drug therapy: Secondary | ICD-10-CM | POA: Diagnosis not present

## 2018-03-18 DIAGNOSIS — K029 Dental caries, unspecified: Secondary | ICD-10-CM | POA: Insufficient documentation

## 2018-03-18 LAB — CBC WITH DIFFERENTIAL/PLATELET
ABS IMMATURE GRANULOCYTES: 0.07 10*3/uL (ref 0.00–0.07)
BASOS PCT: 0 %
Basophils Absolute: 0.1 10*3/uL (ref 0.0–0.1)
Eosinophils Absolute: 0 10*3/uL (ref 0.0–0.5)
Eosinophils Relative: 0 %
HCT: 45.5 % (ref 39.0–52.0)
Hemoglobin: 15.4 g/dL (ref 13.0–17.0)
Immature Granulocytes: 1 %
Lymphocytes Relative: 24 %
Lymphs Abs: 3.6 10*3/uL (ref 0.7–4.0)
MCH: 32 pg (ref 26.0–34.0)
MCHC: 33.8 g/dL (ref 30.0–36.0)
MCV: 94.4 fL (ref 80.0–100.0)
MONOS PCT: 8 %
Monocytes Absolute: 1.2 10*3/uL — ABNORMAL HIGH (ref 0.1–1.0)
NEUTROS ABS: 9.9 10*3/uL — AB (ref 1.7–7.7)
Neutrophils Relative %: 67 %
PLATELETS: 295 10*3/uL (ref 150–400)
RBC: 4.82 MIL/uL (ref 4.22–5.81)
RDW: 13.2 % (ref 11.5–15.5)
WBC: 14.9 10*3/uL — ABNORMAL HIGH (ref 4.0–10.5)
nRBC: 0 % (ref 0.0–0.2)

## 2018-03-18 LAB — COMPREHENSIVE METABOLIC PANEL
ALBUMIN: 4.4 g/dL (ref 3.5–5.0)
ALT: 42 U/L (ref 0–44)
AST: 35 U/L (ref 15–41)
Alkaline Phosphatase: 66 U/L (ref 38–126)
Anion gap: 13 (ref 5–15)
BUN: 14 mg/dL (ref 6–20)
CHLORIDE: 103 mmol/L (ref 98–111)
CO2: 20 mmol/L — ABNORMAL LOW (ref 22–32)
Calcium: 8.9 mg/dL (ref 8.9–10.3)
Creatinine, Ser: 0.93 mg/dL (ref 0.61–1.24)
GFR calc Af Amer: >60 mL/min (ref >60)
Glucose, Bld: 107 mg/dL — ABNORMAL HIGH (ref 70–99)
POTASSIUM: 4 mmol/L (ref 3.5–5.1)
Sodium: 136 mmol/L (ref 135–145)
Total Bilirubin: 0.9 mg/dL (ref 0.3–1.2)
Total Protein: 7.6 g/dL (ref 6.5–8.1)

## 2018-03-18 LAB — LIPASE, BLOOD: LIPASE: 24 U/L (ref 11–51)

## 2018-03-18 MED ORDER — CLONAZEPAM 0.5 MG PO TABS
1.0000 mg | ORAL_TABLET | Freq: Once | ORAL | Status: AC
  Administered 2018-03-18: 1 mg via ORAL
  Filled 2018-03-18: qty 2

## 2018-03-18 MED ORDER — PANTOPRAZOLE SODIUM 40 MG PO TBEC: 40.0000 mg | DELAYED_RELEASE_TABLET | Freq: Every day | ORAL | 0 refills | Status: DC
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
## 2018-03-18 NOTE — ED Notes (Signed)
Bed: [id number] Expected date:  Expected time:  Means of arrival:  Comments: 32 yo M/ elbow injury
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
## 2018-03-18 NOTE — ED Provider Notes (Signed)
Care transferred to me.  Patient has multiple chronic complaints save for the elbow injury last night.  His lab work is reassuring.  While he does have an elevated WBC, this is likely more stress response from the injury.  All of his other complaints are chronic.  He would prescribe PPI as per prior plan.  He also has a lot of other chronic issues that I discussed might be better served with talking with his PCP.  He asked for a new PCP and I have referred him to the wellness center but also discussed he can look around for other doctors that accept Medicaid.  He asked for a Klonopin prescription because he is been given this in the past but not in the last 3 months due to his doctor.  I discussed this is something he will have to follow-up with his PCP for.  Given his been so long since he has had Klonopin safe for tonight, he is at low risk for withdrawal from this.   Results for orders placed or performed during the hospital encounter of 03/18/18  Comprehensive metabolic panel  Result Value Ref Range   Sodium 136 135 - 145 mmol/L   Potassium 4.0 3.5 - 5.1 mmol/L   Chloride 103 98 - 111 mmol/L   CO2 20 (L) 22 - 32 mmol/L   Glucose, Bld 107 (H) 70 - 99 mg/dL   BUN 14 6 - 20 mg/dL   Creatinine, Ser 4.090.93 0.61 - 1.24 mg/dL   Calcium 8.9 8.9 - 81.110.3 mg/dL   Total Protein 7.6 6.5 - 8.1 g/dL   Albumin 4.4 3.5 - 5.0 g/dL   AST 35 15 - 41 U/L   ALT 42 0 - 44 U/L   Alkaline Phosphatase 66 38 - 126 U/L   Total Bilirubin 0.9 0.3 - 1.2 mg/dL   GFR calc non Af Amer >60 >60 mL/min   GFR calc Af Amer >60 >60 mL/min   Anion gap 13 5 - 15  Lipase, blood  Result Value Ref Range   Lipase 24 11 - 51 U/L  CBC with Differential  Result Value Ref Range   WBC 14.9 (H) 4.0 - 10.5 K/uL   RBC 4.82 4.22 - 5.81 MIL/uL   Hemoglobin 15.4 13.0 - 17.0 g/dL   HCT 91.445.5 78.239.0 - 95.652.0 %   MCV 94.4 80.0 - 100.0 fL   MCH 32.0 26.0 - 34.0 pg   MCHC 33.8 30.0 - 36.0 g/dL   RDW 21.313.2 08.611.5 - 57.815.5 %   Platelets 295 150 - 400  K/uL   nRBC 0.0 0.0 - 0.2 %   Neutrophils Relative % 67 %   Neutro Abs 9.9 (H) 1.7 - 7.7 K/uL   Lymphocytes Relative 24 %   Lymphs Abs 3.6 0.7 - 4.0 K/uL   Monocytes Relative 8 %   Monocytes Absolute 1.2 (H) 0.1 - 1.0 K/uL   Eosinophils Relative 0 %   Eosinophils Absolute 0.0 0.0 - 0.5 K/uL   Basophils Relative 0 %   Basophils Absolute 0.1 0.0 - 0.1 K/uL   Immature Granulocytes 1 %   Abs Immature Granulocytes 0.07 0.00 - 0.07 K/uL   Dg Scapula Right  Result Date: 03/18/2018 CLINICAL DATA:  Fall 1 day prior, pt c/o pain in the posterior region of his shoulder near his scapulafall EXAM: RIGHT SCAPULA - 2+ VIEWS COMPARISON:  11/15/2010 FINDINGS: Glenohumeral joint is intact. No evidence of scapular fracture or humeral fracture. The acromioclavicular joint is intact. Mild  degenerative sclerosis along the inferior margin of the glenoid fossa. IMPRESSION: No fracture or dislocation. Electronically Signed   By: Genevive BiStewart  Edmunds M.D.   On: 03/18/2018 07:48   Dg Elbow Complete Right  Result Date: 03/18/2018 CLINICAL DATA:  Fall 1 day prior, obvious swelling posteriorly, pt states he does not have any pain in elbow at this timefall EXAM: RIGHT ELBOW - COMPLETE 3+ VIEW COMPARISON:  None. FINDINGS: No evidence of fracture of the ulna or humerus. The radial head is normal. No joint effusion. Soft tissue swelling over the olecranon. IMPRESSION: Soft tissue swelling without evidence of fracture. Electronically Signed   By: Genevive BiStewart  Edmunds M.D.   On: 03/18/2018 07:46      [name], [name], MD 03/18/18 1020

## 2018-03-24 ENCOUNTER — Emergency Department (HOSPITAL_COMMUNITY)
Admission: EM | Admit: 2018-03-24 | Discharge: 2018-03-24 | Disposition: A | Discharge status: Home / Self Care | Payer: Medicaid Other | Attending: Emergency Medicine | Admitting: Emergency Medicine

## 2018-03-24 ENCOUNTER — Emergency Department (HOSPITAL_COMMUNITY): Payer: Medicaid Other

## 2018-03-24 DIAGNOSIS — Y929 Unspecified place or not applicable: Secondary | ICD-10-CM | POA: Insufficient documentation

## 2018-03-24 DIAGNOSIS — F410 Panic disorder [episodic paroxysmal anxiety] without agoraphobia: Secondary | ICD-10-CM | POA: Diagnosis present

## 2018-03-24 DIAGNOSIS — X58XXXA Exposure to other specified factors, initial encounter: Secondary | ICD-10-CM | POA: Diagnosis not present

## 2018-03-24 DIAGNOSIS — F17210 Nicotine dependence, cigarettes, uncomplicated: Secondary | ICD-10-CM | POA: Insufficient documentation

## 2018-03-24 DIAGNOSIS — S2231XA Fracture of one rib, right side, initial encounter for closed fracture: Secondary | ICD-10-CM

## 2018-03-24 DIAGNOSIS — Y939 Activity, unspecified: Secondary | ICD-10-CM | POA: Diagnosis not present

## 2018-03-24 DIAGNOSIS — Z79899 Other long term (current) drug therapy: Secondary | ICD-10-CM | POA: Insufficient documentation

## 2018-03-24 DIAGNOSIS — Y999 Unspecified external cause status: Secondary | ICD-10-CM | POA: Diagnosis not present

## 2018-03-24 LAB — CBC
HCT: 43.3 % (ref 39.0–52.0)
Hemoglobin: 14.5 g/dL (ref 13.0–17.0)
MCH: 32.2 pg (ref 26.0–34.0)
MCHC: 33.5 g/dL (ref 30.0–36.0)
MCV: 96 fL (ref 80.0–100.0)
PLATELETS: 263 10*3/uL (ref 150–400)
RBC: 4.51 MIL/uL (ref 4.22–5.81)
RDW: 13.1 % (ref 11.5–15.5)
WBC: 12.5 10*3/uL — ABNORMAL HIGH (ref 4.0–10.5)
nRBC: 0 % (ref 0.0–0.2)

## 2018-03-24 LAB — RAPID URINE DRUG SCREEN, HOSP PERFORMED
Amphetamines: POSITIVE — AB
Barbiturates: NOT DETECTED
Benzodiazepines: NOT DETECTED
Cocaine: NOT DETECTED
OPIATES: NOT DETECTED
Tetrahydrocannabinol: POSITIVE — AB

## 2018-03-24 LAB — ETHANOL

## 2018-03-24 LAB — COMPREHENSIVE METABOLIC PANEL
ALT: 35 U/L (ref 0–44)
AST: 29 U/L (ref 15–41)
Albumin: 4.3 g/dL (ref 3.5–5.0)
Alkaline Phosphatase: 66 U/L (ref 38–126)
Anion gap: 14 (ref 5–15)
BUN: 12 mg/dL (ref 6–20)
CO2: 25 mmol/L (ref 22–32)
Calcium: 9.4 mg/dL (ref 8.9–10.3)
Chloride: 97 mmol/L — ABNORMAL LOW (ref 98–111)
Creatinine, Ser: 0.97 mg/dL (ref 0.61–1.24)
GFR calc Af Amer: >60 mL/min (ref >60)
Glucose, Bld: 128 mg/dL — ABNORMAL HIGH (ref 70–99)
Potassium: 3.8 mmol/L (ref 3.5–5.1)
Sodium: 136 mmol/L (ref 135–145)
Total Bilirubin: 0.9 mg/dL (ref 0.3–1.2)
Total Protein: 7.9 g/dL (ref 6.5–8.1)

## 2018-03-24 MED ORDER — VENLAFAXINE HCL ER 150 MG PO CP24: 150.0000 mg | ORAL_CAPSULE | Freq: Every day | ORAL | 0 refills | Status: DC

## 2018-03-24 MED ORDER — ACETAMINOPHEN 325 MG PO TABS: 650.0000 mg | ORAL_TABLET | Freq: Four times a day (QID) | ORAL | 0 refills | Status: DC | PRN

## 2018-03-24 MED ORDER — LORAZEPAM 1 MG PO TABS
1.0000 mg | ORAL_TABLET | Freq: Once | ORAL | Status: AC
  Administered 2018-03-24: 1 mg via ORAL
  Filled 2018-03-24: qty 1

## 2018-03-24 NOTE — Discharge Instructions (Signed)
Return to ED for worsening symptoms, additional injuries or falls, shortness of breath, vomiting or coughing up blood.

## 2018-03-24 NOTE — ED Triage Notes (Signed)
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

## 2018-03-24 NOTE — Progress Notes (Signed)
CSW met with patient to discuss mental health resources. CSW provided patient with resources for medication management, mental health treatment, and therapy resources in the [place]. CSW discussed patient's panic attacks and current lack of treatment for them. CSW noted patient identified substance use concerns as primary for amphetamines. CSW discussed [facility] and additional substance use resources with the patient. CSW noted patient stated he had no other questions and responded positively to the resources at this time.  Lamonte Richer, LCSW, [facility] [phone]

## 2018-03-24 NOTE — ED Provider Notes (Signed)
[facility] EMERGENCY DEPARTMENT Provider Note   CSN: [id number] Arrival date & time: 03/24/18  4098     History   Chief Complaint Chief Complaint  Patient presents with  . Panic Attack    HPI Ricky Wu is a 32 y.o. male with a past medical history of anxiety, depression who presents to ED for evaluation of panic attack.  States that he has a history of insomnia and because of this he was out "partying" last night.  States that he began having a panic attack when "these guys were all up in my face trying to make me mad."  He notes that he has been abusing amphetamines for 1 year and did have alcohol and marijuana use yesterday.  He denies daily alcohol use.  He complains of right rib pain from coughing last week.  He is requesting a refill of his Effexor and Klonopin.  Patient appears to be a poor historian and states that "I mean I have the medicine I do not know if I need a refill." Denies any other somatic complaints. Denies HI, SI or AVH.  HPI  Past Medical History:  Diagnosis Date  . ADHD (attention deficit hyperactivity disorder)   . Anhedonia   . Anxiety   . Anxiety disorder   . Confusion   . Depression   . Disorganized thought process   . Penis disorder   . Sleep apnea    mild no cpap or bipap    Patient Active Problem List   Diagnosis Date Noted  . Alcohol abuse 08/26/2016  . Bimalleolar ankle fracture 11/18/2015  . Memory loss 03/14/2013  . Confusion   . GAD (generalized anxiety disorder) 09/12/2012    Past Surgical History:  Procedure Laterality Date  . FRACTURE SURGERY    . HAND SURGERY    . ORIF ANKLE FRACTURE Left 11/18/2015   Procedure: OPEN REDUCTION INTERNAL FIXATION (ORIF) LEFT ANKLE FRACTURE;  Surgeon: Nadara Mustard, MD;  Location: [facility] OR;  Service: Orthopedics;  Laterality: Left;        Home Medications    Prior to Admission medications   Medication Sig Start Date End Date Taking? Authorizing Provider    acetaminophen (TYLENOL) 325 MG tablet Take 2 tablets (650 mg total) by mouth every 6 (six) hours as needed. 03/24/18   Lucelia Lacey, PA-C  Aspirin-Caffeine (BC FAST PAIN RELIEF) 845-65 MG PACK Take 1 packet by mouth daily as needed (for pain or headache).    [provider]  BIOTIN PO Take 1 tablet by mouth daily.    [provider]  clonazePAM (KLONOPIN) 1 MG tablet Take 1 tablet (1 mg total) by mouth at bedtime as needed for anxiety. Patient not taking: Reported on 03/18/2018 01/15/18   Derwood Kaplan, MD  fluvoxaMINE (LUVOX) 50 MG tablet Take 1 tablet (50 mg total) by mouth at bedtime. 01/15/18   Derwood Kaplan, MD  lisdexamfetamine (VYVANSE) 30 MG capsule Take 1 capsule (30 mg total) by mouth daily. 01/15/18   Derwood Kaplan, MD  Melatonin 3 MG CAPS Take 3 mg by mouth at bedtime as needed (for sleep).    [provider]  mirtazapine (REMERON) 15 MG tablet Take 15 mg by mouth at bedtime. 01/23/18   [provider]  Oxymetazoline HCl (NASAL SPRAY NA) Place 6 sprays into the nose daily as needed (for congestion).    [provider]  pantoprazole (PROTONIX) 40 MG tablet Take 1 tablet (40 mg total) by mouth daily.  03/18/18   Pricilla Loveless, MD  venlafaxine XR (EFFEXOR-XR) 150 MG 24 hr capsule Take 1 capsule (150 mg total) by mouth daily with breakfast. 03/24/18   Dietrich Pates, PA-C    Family History Family History  Problem Relation Age of Onset  . Hypertension Mother   . Diabetes Mother   . Cancer Father   . Arthritis Father   . Diabetes Other     Social History Social History   Tobacco Use  . Smoking status: Current Every Day Smoker    Packs/day: 1.50    Years: 14.00    Pack years: 21.00    Types: Cigarettes  . Smokeless tobacco: Never Used  Substance Use Topics  . Alcohol use: Yes    Comment: weekly  . Drug use: Yes    Types: Methamphetamines     Allergies   Adderall [amphetamine-dextroamphetamine]   Review of  Systems Review of Systems  Constitutional: Negative for appetite change, chills and fever.  HENT: Negative for ear pain, rhinorrhea, sneezing and sore throat.   Eyes: Negative for photophobia and visual disturbance.  Respiratory: Negative for cough, chest tightness, shortness of breath and wheezing.   Cardiovascular: Negative for chest pain and palpitations.  Gastrointestinal: Negative for abdominal pain, blood in stool, constipation, diarrhea, nausea and vomiting.  Genitourinary: Negative for dysuria, hematuria and urgency.  Musculoskeletal: Negative for myalgias.  Skin: Negative for rash.  Neurological: Negative for dizziness, weakness and light-headedness.  Psychiatric/Behavioral: The patient is nervous/anxious.      Physical Exam Updated Vital Signs BP (!) 141/94 (BP Location: Right Arm)   Pulse 95   Temp 97.6 F (36.4 C) (Oral)   Resp 20   SpO2 100%   Physical Exam Vitals signs and nursing note reviewed.  Constitutional:      General: He is not in acute distress.    Appearance: He is well-developed.  HENT:     Head: Normocephalic and atraumatic.     Nose: Nose normal.  Eyes:     General: No scleral icterus.       Left eye: No discharge.     Conjunctiva/sclera: Conjunctivae normal.  Neck:     Musculoskeletal: Normal range of motion and neck supple.  Cardiovascular:     Rate and Rhythm: Normal rate and regular rhythm.     Heart sounds: Normal heart sounds. No murmur. No friction rub. No gallop.   Pulmonary:     Effort: Pulmonary effort is normal. No respiratory distress.     Breath sounds: Normal breath sounds.    Chest:     Chest wall: Tenderness present.  Abdominal:     General: Bowel sounds are normal. There is no distension.     Palpations: Abdomen is soft.     Tenderness: There is no abdominal tenderness. There is no guarding.  Musculoskeletal: Normal range of motion.  Skin:    General: Skin is warm and dry.     Findings: No rash.  Neurological:      Mental Status: He is alert.     Motor: No abnormal muscle tone.     Coordination: Coordination normal.      ED Treatments / Results  Labs (all labs ordered are listed, but only abnormal results are displayed) Labs Reviewed  COMPREHENSIVE METABOLIC PANEL - Abnormal; Notable for the following components:      Result Value   Chloride 97 (*)    Glucose, Bld 128 (*)    All other components within normal limits  CBC -  Abnormal; Notable for the following components:   WBC 12.5 (*)    All other components within normal limits  RAPID URINE DRUG SCREEN, HOSP PERFORMED - Abnormal; Notable for the following components:   Amphetamines POSITIVE (*)    Tetrahydrocannabinol POSITIVE (*)    All other components within normal limits  ETHANOL    EKG None  Radiology Dg Ribs Unilateral W/chest Right  Result Date: 03/24/2018 CLINICAL DATA:  UPPER RIGHT-sided back pain and RIGHT rib pain. Cough. Current smoker. EXAM: RIGHT RIBS AND CHEST - 3+ VIEW COMPARISON:  12/15/2017 and earlier. FINDINGS: Nondisplaced acute fracture involving the POSTERIOR sixth rib. Healing/healed remote RIGHT POSTERIOR eighth and ninth rib fractures as noted previously. Cardiomediastinal silhouette unremarkable and unchanged. Lungs clear. Bronchovascular markings normal. Pulmonary vascularity normal. No visible pleural effusions. No pneumothorax. IMPRESSION: 1. Nondisplaced acute fracture involving the POSTERIOR sixth rib. 2. No acute cardiopulmonary disease. Electronically Signed   By: Hulan Saashomas  Lawrence M.D.   On: 03/24/2018 09:26    Procedures Procedures (including critical care time)  Medications Ordered in ED Medications  LORazepam (ATIVAN) tablet 1 mg (1 mg Oral Given 03/24/18 0854)     Initial Impression / Assessment and Plan / ED Course  I have reviewed the triage vital signs and the nursing notes.  Pertinent labs & imaging results that were available during my care of the patient were reviewed by me and  considered in my medical decision making (see chart for details).     32 year old male with past medical history of polysubstance abuse, anxiety presents for concern for rib fracture from coughing and increasing his anxiety insomnia.  He denies any other somatic complaints.  Denies any SI, HI or AVH.  He is requesting Ativan to help with his anxiety.  He is also requesting a refill of his Effexor.  He was partying last night and states that he got into an argument with people which worsen his anxiety and "set me into a panic attack."  He endorses coughing and he believes that he may have injured his ribs from the cough.  He denies any other chest pain, shortness of breath, fever.  Denies any tremors or seizures.  On exam he is overall well-appearing.  Tenderness palpation of the right side of the back as noted in the image.  X-ray shows nondisplaced sixth rib fracture.  Mild leukocytosis of 12 is most likely reactive.  CMP, ethanol unremarkable.  UDS positive for amphetamines and THC.  Patient given incentive spirometer and Tylenol/ibuprofen for pain control.  Stressed the importance of incentive spirometry for his rib fracture.  Advised him to follow-up with PCP and to return to ED for any severe worsening symptoms.  Patient is hemodynamically stable, in NAD, and able to ambulate in the ED. Evaluation does not show pathology that would require ongoing emergent intervention or inpatient treatment. I explained the diagnosis to the patient. Pain has been managed and has no complaints prior to discharge. Patient is comfortable with above plan and is stable for discharge at this time. All questions were answered prior to disposition. Strict return precautions for returning to the ED were discussed. Encouraged follow up with PCP.    Portions of this note were generated with Scientist, clinical (histocompatibility and immunogenetics)Dragon dictation software. Dictation errors may occur despite best attempts at proofreading.  Final Clinical Impressions(s) / ED Diagnoses    Final diagnoses:  Closed fracture of one rib of right side, initial encounter    ED Discharge Orders  Ordered    venlafaxine XR (EFFEXOR-XR) 150 MG 24 hr capsule  Daily with breakfast     03/24/18 1006    acetaminophen (TYLENOL) 325 MG tablet  Every 6 hours PRN     03/24/18 1006           Dietrich PatesKhatri, Adriana Lina, PA-C 03/24/18 1008    Mesner, Barbara CowerJason, MD 03/25/18 [phone]

## 2018-03-24 NOTE — ED Notes (Signed)
Pt given incentive spirometer with teach back demonstration.

## 2018-03-24 NOTE — ED Triage Notes (Signed)
Pt in via [facility], per report pt was at a private residence and started to have a panic attack, pt hx of the same, c/o pain at R elbow from previous injury per EMS, pt calm and cooperative upon arrival to ED, denies ETOH use and denies drug use

## 2018-03-24 NOTE — ED Notes (Signed)
Pt spit in urine sample, this RN requested another sample

## 2018-10-23 ENCOUNTER — Emergency Department (HOSPITAL_COMMUNITY)
Admission: EM | Admit: 2018-10-23 | Discharge: 2018-10-24 | Disposition: A | Discharge status: Home / Self Care | Payer: Medicaid Other | Attending: Emergency Medicine | Admitting: Emergency Medicine

## 2018-10-23 ENCOUNTER — Emergency Department (HOSPITAL_COMMUNITY): Payer: Medicaid Other

## 2018-10-23 ENCOUNTER — Other Ambulatory Visit: Payer: Self-pay

## 2018-10-23 DIAGNOSIS — R0789 Other chest pain: Secondary | ICD-10-CM | POA: Insufficient documentation

## 2018-10-23 DIAGNOSIS — R079 Chest pain, unspecified: Secondary | ICD-10-CM | POA: Diagnosis present

## 2018-10-23 DIAGNOSIS — Z79899 Other long term (current) drug therapy: Secondary | ICD-10-CM | POA: Insufficient documentation

## 2018-10-23 DIAGNOSIS — R4584 Anhedonia: Secondary | ICD-10-CM | POA: Insufficient documentation

## 2018-10-23 DIAGNOSIS — F329 Major depressive disorder, single episode, unspecified: Secondary | ICD-10-CM | POA: Insufficient documentation

## 2018-10-23 DIAGNOSIS — F17210 Nicotine dependence, cigarettes, uncomplicated: Secondary | ICD-10-CM | POA: Insufficient documentation

## 2018-10-23 DIAGNOSIS — F32A Depression, unspecified: Secondary | ICD-10-CM

## 2018-10-23 LAB — BASIC METABOLIC PANEL
Anion gap: 10 (ref 5–15)
BUN: 8 mg/dL (ref 6–20)
CO2: 23 mmol/L (ref 22–32)
Calcium: 9.4 mg/dL (ref 8.9–10.3)
Chloride: 104 mmol/L (ref 98–111)
Creatinine, Ser: 0.87 mg/dL (ref 0.61–1.24)
GFR calc Af Amer: >60 mL/min (ref >60)
GFR calc non Af Amer: >60 mL/min (ref >60)
Glucose, Bld: 98 mg/dL (ref 70–99)
Potassium: 4.2 mmol/L (ref 3.5–5.1)
Sodium: 137 mmol/L (ref 135–145)

## 2018-10-23 LAB — CBC
HCT: 48.8 % (ref 39.0–52.0)
Hemoglobin: 16.7 g/dL (ref 13.0–17.0)
MCH: 32.7 pg (ref 26.0–34.0)
MCHC: 34.2 g/dL (ref 30.0–36.0)
MCV: 95.7 fL (ref 80.0–100.0)
Platelets: 248 10*3/uL (ref 150–400)
RBC: 5.1 MIL/uL (ref 4.22–5.81)
RDW: 13.5 % (ref 11.5–15.5)
WBC: 8.2 10*3/uL (ref 4.0–10.5)
nRBC: 0 % (ref 0.0–0.2)

## 2018-10-23 LAB — TROPONIN I (HIGH SENSITIVITY)
Troponin I (High Sensitivity): <2 ng/L (ref <18)
Troponin I (High Sensitivity): <2 ng/L (ref <18)

## 2018-10-23 MED ORDER — SODIUM CHLORIDE 0.9% FLUSH: 3.0000 mL | Freq: Once | INTRAVENOUS | Status: DC

## 2018-10-23 NOTE — ED Triage Notes (Signed)
Pt reports chest pain for over 1 year with a recent increase in frequency over the last 2-3 weeks has been happening daily. Pt in no distress. Denies fevers, chills, oo any other sxs.

## 2018-10-23 NOTE — BH Assessment (Addendum)
Tele Assessment Note   Patient Name: Ricky Wu MRN: [id number] Referring Physician: Wyn Quaker, PA Location of Patient: [facility] Location of Provider: [place] is an 33 y.o. male presenting with fleeting SI with no plan. Patient reported increased depression for 9 years, which is when he and his friends used "bath salts" for 6 months. Patient reported having a drug reaction to bath salts. Patient reported being afraid of people and flinching whenever someone looks at him. Patient reported feeling like a weirdo and not being able to converse with people. Patient reported inpatient mental health at [facility] in 2011 after using "bath salts". Patient denied prior suicide attempts and self-harming behaviors. Patient reported being afraid to wake up due to extreme anxiety of what the next is going to bring. Patient is currently being seen at [facility] and will pick up mental health medications on tomorrow morning.     Patient reported couch surfing and that he is homeless. Patient reported childhood traumas. Patient denied access to guns or weapons. Patient initially seen for chest pain. Patient was anxious and cooperative during assessment. Patient is requesting discharge so he can pick up his medications.  Diagnosis: Major depressive disorder  Past Medical History:  Past Medical History:  Diagnosis Date  . ADHD (attention deficit hyperactivity disorder)   . Anhedonia   . Anxiety   . Anxiety disorder   . Confusion   . Depression   . Disorganized thought process   . Penis disorder   . Sleep apnea    mild no cpap or bipap    Past Surgical History:  Procedure Laterality Date  . FRACTURE SURGERY    . HAND SURGERY    . ORIF ANKLE FRACTURE Left 11/18/2015   Procedure: OPEN REDUCTION INTERNAL FIXATION (ORIF) LEFT ANKLE FRACTURE;  Surgeon: Newt Minion, MD;  Location: [facility];  Service: Orthopedics;  Laterality: Left;     Family History:  Family History  Problem Relation Age of Onset  . Hypertension Mother   . Diabetes Mother   . Cancer Father   . Arthritis Father   . Diabetes Other     Social History:  reports that he has been smoking cigarettes. He has a 21.00 pack-year smoking history. He has never used smokeless tobacco. He reports current alcohol use. He reports current drug use. Drug: Methamphetamines.  Additional Social History:  Alcohol / Drug Use Pain Medications: see MAR Prescriptions: see MAR Over the Counter: see MAR  CIWA: CIWA-Ar BP: (!) 129/96 Pulse Rate: 62 COWS:    Allergies:  Allergies  Allergen Reactions  . Adderall [Amphetamine-Dextroamphetamine] Other (See Comments)    Accidental overdose    Home Medications: (Not in a hospital admission)   OB/GYN Status:  No LMP for male patient.  General Assessment Data Location of Assessment: [facility] ED TTS Assessment: In system Is this a Tele or Face-to-Face Assessment?: Tele Assessment Is this an Initial Assessment or a Re-assessment for this encounter?: Initial Assessment Patient Accompanied by:: N/A Language Other than English: No Living Arrangements: Homeless/Shelter What gender do you identify as?: Male Marital status: Single Living Arrangements: (homeless) Can pt return to current living arrangement?: Yes Admission Status: Voluntary Is patient capable of signing voluntary admission?: Yes Referral Source: Self/Family/Friend     Crisis Care Plan Living Arrangements: (homeless) Legal Guardian: (self) Name of Psychiatrist: ([facility]) Name of Therapist: (none)  Education Status Is patient currently in school?: No  Is the patient employed, unemployed or receiving disability?: Unemployed  Risk to self with the past 6 months Suicidal Ideation: Yes-Currently Present Has patient been a risk to self within the past 6 months prior to admission? : No Suicidal Intent: No Has patient had any  suicidal intent within the past 6 months prior to admission? : No Is patient at risk for suicide?: Yes Suicidal Plan?: No Has patient had any suicidal plan within the past 6 months prior to admission? : No Access to Means: No What has been your use of drugs/alcohol within the last 12 months?: (none reported ) Previous Attempts/Gestures: No How many times?: (0) Other Self Harm Risks: (none reported) Triggers for Past Attempts: (n/a) Intentional Self Injurious Behavior: None Family Suicide History: Yes(great grandmother) Recent stressful life event(s): (anxiety) Persecutory voices/beliefs?: No Depression: Yes Depression Symptoms: Insomnia, Tearfulness, Isolating, Fatigue, Guilt, Loss of interest in usual pleasures, Feeling worthless/self pity Substance abuse history and/or treatment for substance abuse?: No Suicide prevention information given to non-admitted patients: Not applicable  Risk to Others within the past 6 months Homicidal Ideation: No Does patient have any lifetime risk of violence toward others beyond the six months prior to admission? : No Thoughts of Harm to Others: No Current Homicidal Intent: No Current Homicidal Plan: No Access to Homicidal Means: No Identified Victim: (n/a) History of harm to others?: No Assessment of Violence: None Noted Violent Behavior Description: (none reported) Does patient have access to weapons?: No Criminal Charges Pending?: No Does patient have a court date: No Is patient on probation?: No  Psychosis Hallucinations: None noted Delusions: None noted  Mental Status Report Appearance/Hygiene: Unremarkable Eye Contact: Good Motor Activity: Unremarkable Speech: Logical/coherent Level of Consciousness: Alert Mood: Depressed, Anxious Affect: Anxious, Depressed Anxiety Level: Severe Thought Processes: Coherent, Relevant Judgement: Partial Orientation: Person, Place, Time, Situation Obsessive Compulsive Thoughts/Behaviors:  None  Cognitive Functioning Concentration: Good Memory: Recent Intact Is patient IDD: No Insight: Fair Impulse Control: Fair Appetite: Good Have you had any weight changes? : No Change Sleep: No Change Total Hours of Sleep: (sporadic) Vegetative Symptoms: Staying in bed, Not bathing, Decreased grooming  ADLScreening [facility]) Patient's cognitive ability adequate to safely complete daily activities?: Yes Patient able to express need for assistance with ADLs?: Yes Independently performs ADLs?: Yes (appropriate for developmental age)  Prior Inpatient Therapy Prior Inpatient Therapy: Yes Prior Therapy Dates: (2011) [facility] Reason for Treatment: (drug usage "bath salts")  Prior Outpatient Therapy Prior Outpatient Therapy: Yes Prior Therapy Dates: (present) Prior Therapy [facility]ilty[facility]Provider([facility] ([facility] Reason for Treatment: (depression and anxiety) Does patient have an ACCT team?: No Does patient have Intensive In-House Services?  : No Does patient have [facility] services? : No Does patient have P4CC services?: No  ADL Screening (condition at time of admission) Patient's cognitive ability adequate to safely complete daily activities?: Yes Patient able to express need for assistance with ADLs?: Yes Independently performs ADLs?: Yes (appropriate for developmental age)  Disposition:  Disposition Initial Assessment Completed for this Encounter: Yes  Nira ConnJason Berry, NP, recommends overnight observation for safety and stabilization with a.m. psych consult.   This service was provided via telemedicine using a 2-way, interactive audio and video technology.  Names of all persons participating in this telemedicine service and their role in this encounter. Name: Steele BergDonnie Gasner Role: Patient  Name: Al CorpusLatisha Junita Kubota Role: TTS Clinician  Name:  Role:   Name:  Role:     Burnetta SabinLatisha D Genae Strine 10/23/2018 10:34  PM

## 2018-10-23 NOTE — ED Provider Notes (Signed)
[facility] Provider Note   CSN: [id number] Arrival date & time: 10/23/18  1641    History   Chief Complaint Chief Complaint  Patient presents with  . Chest Pain    HPI Ricky Wu is a 33 y.o. male with a past medical history of ADHD, generalized anxiety disorder, depression, who presents today for evaluation of 2 complaints. 1.  Chest pain.  He reports that he has had chest tightness intermittently over the past year however there is been an increased frequency over the past 2 to 3 weeks.  Currently he has had chest pressure since late last night/early this morning.  He says that it has not changed or moved and is on the left anterior chest.  He states that he is been seen for this in the past and told he was having anxiety and panic.  He states that he is concerned as he used to abuse amphetamines that he may have done damage to his heart.  He states that he has been clean for the past 6 months and was recently released from jail.  He denies any cough or shortness of breath.  No fevers.   He denies history of blood clot, no recent surgeries or immobilizations.  No hemoptysis.  2.  Depression: Patient reports that he had issues with polysubstance abuse including amphetamines.  He states that he recently was released from jail and had his medications disrupted.  He states that he is having significant anhedonia, stating that he is unable to find anything that makes him feel joy.  He states that he has not been eating well as he does not have the energy or desire to get up and go to the grocery store.  He states that he does not even have the desire to watch TV or do anything while in bed.  He states that he is currently living with a friend however has no drive to work or do anything other than laying in bed all day.      HPI  Past Medical History:  Diagnosis Date  . ADHD (attention deficit hyperactivity disorder)   . Anhedonia   . Anxiety    . Anxiety disorder   . Confusion   . Depression   . Disorganized thought process   . Penis disorder   . Sleep apnea    mild no cpap or bipap    Patient Active Problem List   Diagnosis Date Noted  . Alcohol abuse 08/26/2016  . Bimalleolar ankle fracture 11/18/2015  . Memory loss 03/14/2013  . Confusion   . GAD (generalized anxiety disorder) 09/12/2012    Past Surgical History:  Procedure Laterality Date  . FRACTURE SURGERY    . HAND SURGERY    . ORIF ANKLE FRACTURE Left 11/18/2015   Procedure: OPEN REDUCTION INTERNAL FIXATION (ORIF) LEFT ANKLE FRACTURE;  Surgeon: Nadara MustardMarcus V Duda, MD;  Location: [facility] OR;  Service: Orthopedics;  Laterality: Left;        Home Medications    Prior to Admission medications   Medication Sig Start Date End Date Taking? Authorizing Provider  acetaminophen (TYLENOL) 325 MG tablet Take 2 tablets (650 mg total) by mouth every 6 (six) hours as needed. 03/24/18   Khatri, Hina, PA-C  Aspirin-Caffeine (BC FAST PAIN RELIEF) 845-65 MG PACK Take 1 packet by mouth daily as needed (for pain or headache).    [provider]  BIOTIN PO Take 1 tablet by mouth daily.  [provider]  clonazePAM (KLONOPIN) 1 MG tablet Take 1 tablet (1 mg total) by mouth at bedtime as needed for anxiety. Patient not taking: Reported on 03/18/2018 01/15/18   Derwood KaplanNanavati, Ankit, MD  fluvoxaMINE (LUVOX) 50 MG tablet Take 1 tablet (50 mg total) by mouth at bedtime. 01/15/18   Derwood KaplanNanavati, Ankit, MD  lisdexamfetamine (VYVANSE) 30 MG capsule Take 1 capsule (30 mg total) by mouth daily. 01/15/18   Derwood KaplanNanavati, Ankit, MD  Melatonin 3 MG CAPS Take 3 mg by mouth at bedtime as needed (for sleep).    [provider]  mirtazapine (REMERON) 15 MG tablet Take 15 mg by mouth at bedtime. 01/23/18   [provider]  Oxymetazoline HCl (NASAL SPRAY NA) Place 6 sprays into the nose daily as needed (for congestion).    [provider]  pantoprazole (PROTONIX) 40 MG  tablet Take 1 tablet (40 mg total) by mouth daily. 03/18/18   [name], Scott, MD  venlafaxine XR (EFFEXOR-XR) 150 MG 24 hr capsule Take 1 capsule (150 mg total) by mouth daily with breakfast. 03/24/18   Dietrich PatesKhatri, Hina, PA-C    Family History Family History  Problem Relation Age of Onset  . Hypertension Mother   . Diabetes Mother   . Cancer Father   . Arthritis Father   . Diabetes Other     Social History Social History   Tobacco Use  . Smoking status: Current Every Day Smoker    Packs/day: 1.50    Years: 14.00    Pack years: 21.00    Types: Cigarettes  . Smokeless tobacco: Never Used  Substance Use Topics  . Alcohol use: Yes    Comment: weekly  . Drug use: Yes    Types: Methamphetamines     Allergies   Adderall [amphetamine-dextroamphetamine]   Review of Systems Review of Systems  Constitutional: Negative for chills and fever.  Respiratory: Positive for chest tightness. Negative for cough and shortness of breath.   Cardiovascular: Positive for chest pain. Negative for palpitations and leg swelling.  Gastrointestinal: Negative for abdominal pain, diarrhea, nausea and vomiting.  Musculoskeletal: Negative for arthralgias, back pain and neck pain.  Skin: Negative for color change and wound.  Neurological: Negative for weakness and headaches.  Psychiatric/Behavioral: Positive for behavioral problems and dysphoric mood. Negative for agitation, confusion and self-injury. The patient is nervous/anxious.   All other systems reviewed and are negative.    Physical Exam Updated Vital Signs BP (!) 129/96   Pulse 62   Temp 98.8 F (37.1 C) (Oral)   Resp 16   SpO2 96%   Physical Exam Vitals signs and nursing note reviewed.  Constitutional:      General: He is not in acute distress.    Appearance: He is well-developed. He is not diaphoretic.  HENT:     Head: Normocephalic and atraumatic.  Eyes:     General: No scleral icterus.       Right eye: No discharge.         Left eye: No discharge.     Conjunctiva/sclera: Conjunctivae normal.  Neck:     Musculoskeletal: Normal range of motion and neck supple.  Cardiovascular:     Rate and Rhythm: Normal rate and regular rhythm.     Pulses:          Radial pulses are 2+ on the right side and 2+ on the left side.       Dorsalis pedis pulses are 2+ on the right side and 2+ on  the left side.       Posterior tibial pulses are 2+ on the right side and 2+ on the left side.     Heart sounds: Normal heart sounds. No murmur.  Pulmonary:     Effort: Pulmonary effort is normal. No respiratory distress.     Breath sounds: Normal breath sounds. No stridor. No decreased breath sounds.  Chest:     Chest wall: Tenderness (TTP over anterior left sternocostal joints both recreates and exacerbates his reported pain.  ) present. No mass, deformity or edema.  Abdominal:     General: There is no distension.     Palpations: Abdomen is soft.     Tenderness: There is no abdominal tenderness.  Musculoskeletal:        General: No deformity.     Right lower leg: He exhibits no tenderness. No edema.     Left lower leg: He exhibits no tenderness. No edema.  Skin:    General: Skin is warm and dry.  Neurological:     General: No focal deficit present.     Mental Status: He is alert.     Motor: No abnormal muscle tone.  Psychiatric:        Mood and Affect: Mood is depressed.        Behavior: Behavior is withdrawn. Behavior is cooperative.        Thought Content: Thought content does not include homicidal or suicidal ideation. Thought content does not include homicidal or suicidal plan.     Comments: He reports anhedonia, apathy, feelings of hopelessness, low energy, depression.       ED Treatments / Results  Labs (all labs ordered are listed, but only abnormal results are displayed) Labs Reviewed  BASIC METABOLIC PANEL  CBC  TROPONIN I (HIGH SENSITIVITY)  TROPONIN I (HIGH SENSITIVITY)    EKG EKG Interpretation   Date/Time:  Tuesday October 23 2018 16:58:54 EDT Ventricular Rate:  70 PR Interval:  154 QRS Duration: 82 QT Interval:  372 QTC Calculation: 401 R Axis:   19 Text Interpretation:  Normal sinus rhythm Normal ECG No significant change since last tracing Confirmed by Cathren LaineSteinl, Kevin ([id number]) on 10/23/2018 6:39:32 PM   Radiology Dg Chest 2 View  Result Date: 10/23/2018 CLINICAL DATA:  Chest pain EXAM: CHEST - 2 VIEW COMPARISON:  March 24, 2018 FINDINGS: There is no appreciable edema or consolidation. Heart size and pulmonary vascularity are normal. No adenopathy. There old healed rib fractures on the right. IMPRESSION: No edema or consolidation.  Cardiac silhouette within normal limits. Electronically Signed   By: Bretta BangWilliam  Woodruff III M.D.   On: 10/23/2018 18:11    Procedures Procedures (including critical care time)  Medications Ordered in ED Medications  sodium chloride flush (NS) 0.9 % injection 3 mL (has no administration in time range)     Initial Impression / Assessment and Plan / ED Course  I have reviewed the triage vital signs and the nursing notes.  Pertinent labs & imaging results that were available during my care of the patient were reviewed by me and considered in my medical decision making (see chart for details).       Patient presents today for evaluation of chest pain.  His chest pain is been present for more than 6 hours at the time of arrival.  Troponin is negative, EKG without evidence of ischemia or other cause for his pain.  His pain worsens the more anxious he feels.  He does not have any significant  hematologic or electrolyte derangements.  He does not have significant cardiomegaly, infiltrates, or other abnormalities on his chest x-ray.  He does have mild tenderness to palpation over the anterior chest which worsens his pain.  I suspect that his chest pain is a combination of musculoskeletal pain and somatic.  He is PERC negative.  Heart score is in the low  category.  No additional emergency work-up indicated at this time.  2.  Depression: Patient reports significant depressive symptoms including anhedonia, feelings of helplessness, worthlessness, and appears withdrawn from the world around him.  TTS consult was placed, they recommend overnight observation with reevaluation in the morning.  Patient is medically clear for psychiatric disposition.    Final Clinical Impressions(s) / ED Diagnoses   Final diagnoses:  Atypical chest pain  Depression, unspecified depression type    ED Discharge Orders    None       Ollen Gross 10/23/18 2316    Davonna Belling, MD 10/23/18 2320

## 2018-10-23 NOTE — ED Notes (Signed)
TTS being performed at bedside 
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
## 2018-10-24 NOTE — Discharge Instructions (Signed)
Please follow-up with one of the OP counseling centers.   Return to the ED for new or worsening symptoms-- especially if you start feeling suicidal or homicidal, experience hallucinations, etc.

## 2018-11-27 ENCOUNTER — Institutional Professional Consult (permissible substitution): Payer: Medicaid Other | Admitting: Plastic Surgery

## 2018-12-11 ENCOUNTER — Institutional Professional Consult (permissible substitution): Payer: Medicaid Other | Admitting: Plastic Surgery

## 2018-12-28 ENCOUNTER — Institutional Professional Consult (permissible substitution): Payer: Medicaid Other | Admitting: Plastic Surgery

## 2019-01-29 ENCOUNTER — Institutional Professional Consult (permissible substitution): Payer: Medicaid Other | Admitting: Plastic Surgery

## 2019-05-08 ENCOUNTER — Other Ambulatory Visit: Payer: Self-pay

## 2019-05-08 ENCOUNTER — Encounter: Payer: Self-pay | Admitting: Plastic Surgery

## 2019-05-08 ENCOUNTER — Ambulatory Visit (INDEPENDENT_AMBULATORY_CARE_PROVIDER_SITE_OTHER): Payer: Medicaid Other | Admitting: Plastic Surgery

## 2019-05-08 VITALS — BP 148/99 | HR 81 | Temp 96.6°F | Ht 75.0 in | Wt 257.8 lb

## 2019-05-08 DIAGNOSIS — J342 Deviated nasal septum: Secondary | ICD-10-CM
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
## 2019-06-06 ENCOUNTER — Ambulatory Visit (INDEPENDENT_AMBULATORY_CARE_PROVIDER_SITE_OTHER): Payer: Medicaid Other | Admitting: Otolaryngology

## 2019-07-07 ENCOUNTER — Emergency Department (HOSPITAL_COMMUNITY)
Admission: EM | Admit: 2019-07-07 | Discharge: 2019-07-07 | Disposition: A | Discharge status: Home / Self Care | Payer: Medicaid Other | Attending: Emergency Medicine | Admitting: Emergency Medicine

## 2019-07-07 ENCOUNTER — Encounter (HOSPITAL_COMMUNITY): Payer: Self-pay | Admitting: *Deleted

## 2019-07-07 ENCOUNTER — Other Ambulatory Visit: Payer: Self-pay

## 2019-07-07 DIAGNOSIS — Z79899 Other long term (current) drug therapy: Secondary | ICD-10-CM | POA: Insufficient documentation

## 2019-07-07 DIAGNOSIS — F17210 Nicotine dependence, cigarettes, uncomplicated: Secondary | ICD-10-CM | POA: Insufficient documentation

## 2019-07-07 DIAGNOSIS — B353 Tinea pedis: Secondary | ICD-10-CM | POA: Insufficient documentation

## 2019-07-07 DIAGNOSIS — Z590 Homelessness: Secondary | ICD-10-CM | POA: Insufficient documentation

## 2019-07-07 DIAGNOSIS — L089 Local infection of the skin and subcutaneous tissue, unspecified: Secondary | ICD-10-CM | POA: Diagnosis present

## 2019-07-07 MED ORDER — TERBINAFINE HCL 1 % EX CREA: 1.0000 "application " | TOPICAL_CREAM | Freq: Two times a day (BID) | CUTANEOUS | 0 refills | Status: AC

## 2019-07-07 NOTE — ED Provider Notes (Signed)
[facility]-EMERGENCY DEPT Provider Note   CSN: [id number] Arrival date & time: 07/07/19  0409     History Chief Complaint  Patient presents with  . Foot Pain    Ricky Wu is a 34 y.o. male.  34 y.o male with a PMH of ADHD, Anxiety, alcohol abuse presents to the ED with a chief complaint of bilateral foot wounds for the past week.  Patient reports he is doing an excessive amount of walking, has noted blisters to the bottom of his feet.  Patient is currently homeless although he has not stayed in the street and reports has been staying with friends.  He attempted to pop these blisters with some relief.  However, due to constantly walking wounds have become painful.  Second complaint is he is also requesting medication for panic attacks, currently followed for depression and anxiety.  No fevers, SI, HI, hallucinations.  The history is provided by the patient.  Foot Pain This is a new problem. The current episode started more than 1 week ago. The problem occurs constantly. The problem has not changed since onset.Pertinent negatives include no chest pain, no abdominal pain, no headaches and no shortness of breath. The symptoms are aggravated by walking. The symptoms are relieved by rest. He has tried rest for the symptoms. The treatment provided no relief.       Past Medical History:  Diagnosis Date  . ADHD (attention deficit hyperactivity disorder)   . Anhedonia   . Anxiety   . Anxiety disorder   . Confusion   . Depression   . Disorganized thought process   . Penis disorder   . Sleep apnea    mild no cpap or bipap    Patient Active Problem List   Diagnosis Date Noted  . Alcohol abuse 08/26/2016  . Bimalleolar ankle fracture 11/18/2015  . Memory loss 03/14/2013  . Confusion   . GAD (generalized anxiety disorder) 09/12/2012    Past Surgical History:  Procedure Laterality Date  . FRACTURE SURGERY    . HAND SURGERY    . ORIF ANKLE FRACTURE Left  11/18/2015   Procedure: OPEN REDUCTION INTERNAL FIXATION (ORIF) LEFT ANKLE FRACTURE;  Surgeon: Nadara Mustard, MD;  Location: [facility] OR;  Service: Orthopedics;  Laterality: Left;       Family History  Problem Relation Age of Onset  . Hypertension Mother   . Diabetes Mother   . Cancer Father   . Arthritis Father   . Diabetes Other     Social History   Tobacco Use  . Smoking status: Current Every Day Smoker    Packs/day: 1.50    Years: 14.00    Pack years: 21.00    Types: Cigarettes  . Smokeless tobacco: Never Used  Substance Use Topics  . Alcohol use: Yes    Comment: weekly  . Drug use: Yes    Types: Methamphetamines    Home Medications Prior to Admission medications   Medication Sig Start Date End Date Taking? Authorizing Provider  amLODipine (NORVASC) 2.5 MG tablet Take 2.5 mg by mouth daily. 12/21/18   [provider]  FLUoxetine (PROZAC) 20 MG capsule Take 20 mg by mouth daily.    [provider]  FLUoxetine (PROZAC) 40 MG capsule Take 40 mg by mouth daily.    [provider]  gabapentin (NEURONTIN) 400 MG capsule Take 400 mg by mouth 2 (two) times a day. 09/10/18   [provider]  lisdexamfetamine (VYVANSE) 30 MG capsule  Take 1 capsule (30 mg total) by mouth daily. Patient not taking: Reported on 10/23/2018 01/15/18   Varney Biles, MD  pantoprazole (PROTONIX) 40 MG tablet Take 1 tablet (40 mg total) by mouth daily. Patient not taking: Reported on 10/23/2018 03/18/18   Sherwood Gambler, MD  terbinafine (ATHLETES FOOT, TERBINAFINE,) 1 % cream Apply 1 application topically 2 (two) times daily for 7 days. Please apply in between your toes for the next week 07/07/19 07/14/19  Janeece Fitting, PA-C    Allergies    Adderall [amphetamine-dextroamphetamine]  Review of Systems   Review of Systems  Constitutional: Negative for fever.  HENT: Negative for sore throat.   Respiratory: Negative for shortness of breath.   Cardiovascular: Negative for  chest pain.  Gastrointestinal: Negative for abdominal pain.  Genitourinary: Negative for flank pain.  Musculoskeletal: Negative for back pain.  Skin: Positive for wound.  Neurological: Negative for light-headedness and headaches.    Physical Exam Updated Vital Signs BP (!) 157/102   Pulse 92   Temp 98.1 F (36.7 C) (Oral)   Resp 18   Ht 6\' 2"  (1.88 m)   Wt 106.6 kg   SpO2 98%   BMI 30.17 kg/m   Physical Exam Vitals and nursing note reviewed.  Constitutional:      Appearance: Normal appearance.  HENT:     Head: Normocephalic and atraumatic.     Nose: Nose normal.     Mouth/Throat:     Mouth: Mucous membranes are moist.  Eyes:     Pupils: Pupils are equal, round, and reactive to light.  Cardiovascular:     Rate and Rhythm: Normal rate.     Pulses:          Dorsalis pedis pulses are 2+ on the right side and 2+ on the left side.       Posterior tibial pulses are 2+ on the right side and 2+ on the left side.  Pulmonary:     Effort: Pulmonary effort is normal.  Abdominal:     General: Abdomen is flat.  Musculoskeletal:     Cervical back: Normal range of motion and neck supple.  Feet:     Right foot:     Skin integrity: Callus present. No blister.     Toenail Condition: Fungal disease present.    Left foot:     Skin integrity: Callus present. No blister.     Toenail Condition: Fungal disease present.    Comments: Interdigital fungal disease to BL feet. No erythema, edema, fissures noted.  Skin:    General: Skin is warm and dry.     Findings: Erythema present.  Neurological:     Mental Status: He is alert and oriented to person, place, and time.     ED Results / Procedures / Treatments   Labs (all labs ordered are listed, but only abnormal results are displayed) Labs Reviewed - No data to display  EKG None  Radiology No results found.  Procedures Procedures (including critical care time)  Medications Ordered in ED Medications - No data to  display  ED Course  I have reviewed the triage vital signs and the nursing notes.  Pertinent labs & imaging results that were available during my care of the patient were reviewed by me and considered in my medical decision making (see chart for details).    MDM Rules/Calculators/A&P    Patient with a PMH of alcohol abuse, presents to the ED with complaints of bilateral foot pain for the  past week.  He also reports needing anxiety medication for panic attacks.  Patient does have a previous history of anxiety attack and depression, currently on medication placed by Dr. Mervyn Skeeters, reports he is inquiring meds for abortive therapy, states that he has received this in the past while in the ED.  He is requesting an Ativan prescription on today's visit.  We discussed that I am unable to provide this at this time as this is a controlled substance and due to his previous chart reviews with amphetamine abuse, methamphetamine abuse, alcohol abuse unable to provide this medication.  Bilateral toes surrounded with tinea pedis, discoloration in the skin, it looks like patient has been ambulating several miles with the same shoes and likely wet socks.  Does report popping blisters as he was hoping to obtain some relief.  Does describe this as itchy.  No erythema, edema, changes in the skin to suggest cellulitis.  It is very disheveled.  Reports he has been staying with friends, although at the current time he is homeless.  Provided with terbinafine 1% external cream to help with tinea pedis, we discussed Ativan prescription will likely need to be filled by his psychiatrist, or person controlling his anxiety and depression.  Vitals are within normal limits, patient stable for discharge.  Portions of this note were generated with Scientist, clinical (histocompatibility and immunogenetics). Dictation errors may occur despite best attempts at proofreading.  Final Clinical Impression(s) / ED Diagnoses Final diagnoses:  Tinea pedis of both feet    Rx  / DC Orders ED Discharge Orders         Ordered    terbinafine (ATHLETES FOOT, TERBINAFINE,) 1 % cream  2 times daily     07/07/19 0657           Claude Manges, PA-C 07/07/19 0701    Devoria Albe, MD 07/07/19 [phone]

## 2019-07-07 NOTE — ED Triage Notes (Signed)
Pt says he has blisters on the bottom of both feet from excessive walking. He also wants medication to help with his panic attacks. Taking gabapentin and zoloft.

## 2019-07-07 NOTE — Discharge Instructions (Signed)
I have prescribed medication to help treat your foot fungal disease, please apply this cream in between your toes.  Please maintain your feet dry you may apply powder to help with drying off of fungus.

## 2019-08-10 ENCOUNTER — Emergency Department (HOSPITAL_COMMUNITY)
Admission: EM | Admit: 2019-08-10 | Discharge: 2019-08-10 | Disposition: A | Discharge status: Home / Self Care | Payer: Medicaid Other | Attending: Emergency Medicine | Admitting: Emergency Medicine

## 2019-08-10 ENCOUNTER — Other Ambulatory Visit: Payer: Self-pay

## 2019-08-10 ENCOUNTER — Encounter (HOSPITAL_COMMUNITY): Payer: Self-pay

## 2019-08-10 ENCOUNTER — Emergency Department (HOSPITAL_COMMUNITY): Payer: Medicaid Other

## 2019-08-10 DIAGNOSIS — Z5321 Procedure and treatment not carried out due to patient leaving prior to being seen by health care provider: Secondary | ICD-10-CM | POA: Diagnosis not present

## 2019-08-10 DIAGNOSIS — M25532 Pain in left wrist: Secondary | ICD-10-CM | POA: Insufficient documentation

## 2019-08-10 NOTE — ED Notes (Signed)
Pt called for XR and not in lobby.
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
## 2019-08-12 ENCOUNTER — Other Ambulatory Visit: Payer: Self-pay

## 2019-08-12 ENCOUNTER — Encounter (HOSPITAL_COMMUNITY): Payer: Self-pay

## 2019-08-12 ENCOUNTER — Emergency Department (HOSPITAL_COMMUNITY): Payer: Medicaid Other

## 2019-08-12 ENCOUNTER — Emergency Department (HOSPITAL_COMMUNITY)
Admission: EM | Admit: 2019-08-12 | Discharge: 2019-08-12 | Disposition: A | Discharge status: Home / Self Care | Payer: Medicaid Other | Attending: Emergency Medicine | Admitting: Emergency Medicine

## 2019-08-12 DIAGNOSIS — Z5321 Procedure and treatment not carried out due to patient leaving prior to being seen by health care provider: Secondary | ICD-10-CM | POA: Insufficient documentation

## 2019-08-12 DIAGNOSIS — F419 Anxiety disorder, unspecified: Secondary | ICD-10-CM | POA: Insufficient documentation

## 2019-08-12 NOTE — ED Notes (Addendum)
Pt states that he is leaving because he has to catch the bus. Staff recommended, that the pt stay.
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
## 2019-08-15 ENCOUNTER — Other Ambulatory Visit: Payer: Self-pay

## 2019-08-15 ENCOUNTER — Emergency Department (HOSPITAL_COMMUNITY)
Admission: EM | Admit: 2019-08-15 | Discharge: 2019-08-15 | Disposition: A | Discharge status: Home / Self Care | Payer: Medicaid Other | Attending: Emergency Medicine | Admitting: Emergency Medicine

## 2019-08-15 DIAGNOSIS — Y999 Unspecified external cause status: Secondary | ICD-10-CM | POA: Diagnosis not present

## 2019-08-15 DIAGNOSIS — S62345A Nondisplaced fracture of base of fourth metacarpal bone, left hand, initial encounter for closed fracture: Secondary | ICD-10-CM | POA: Diagnosis not present

## 2019-08-15 DIAGNOSIS — Y9389 Activity, other specified: Secondary | ICD-10-CM | POA: Insufficient documentation

## 2019-08-15 DIAGNOSIS — F419 Anxiety disorder, unspecified: Secondary | ICD-10-CM | POA: Diagnosis not present

## 2019-08-15 DIAGNOSIS — S62347A Nondisplaced fracture of base of fifth metacarpal bone. left hand, initial encounter for closed fracture: Secondary | ICD-10-CM | POA: Insufficient documentation

## 2019-08-15 DIAGNOSIS — F17210 Nicotine dependence, cigarettes, uncomplicated: Secondary | ICD-10-CM | POA: Insufficient documentation

## 2019-08-15 DIAGNOSIS — Y929 Unspecified place or not applicable: Secondary | ICD-10-CM | POA: Insufficient documentation

## 2019-08-15 DIAGNOSIS — Z79899 Other long term (current) drug therapy: Secondary | ICD-10-CM | POA: Insufficient documentation

## 2019-08-15 DIAGNOSIS — R0781 Pleurodynia: Secondary | ICD-10-CM | POA: Insufficient documentation

## 2019-08-15 DIAGNOSIS — S6992XA Unspecified injury of left wrist, hand and finger(s), initial encounter: Secondary | ICD-10-CM | POA: Diagnosis present

## 2019-08-15 DIAGNOSIS — S82892A Other fracture of left lower leg, initial encounter for closed fracture: Secondary | ICD-10-CM | POA: Diagnosis not present

## 2019-08-15 MED ORDER — IBUPROFEN 800 MG PO TABS: 800.0000 mg | ORAL_TABLET | Freq: Three times a day (TID) | ORAL | 0 refills | Status: DC
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
## 2019-08-15 NOTE — ED Notes (Signed)
Ortho tech notified of orders. 

## 2019-08-15 NOTE — Discharge Instructions (Signed)
Make sure to follow-up with the hand doctor as soon as possible as there may be joint involvement in your fracture which may require surgical repair.  Please make sure to call the phone number tomorrow morning and schedule an appointment.  Please make sure to do the same and call Dr. Lajoyce Corners tomorrow to schedule an appointment for ankle fracture.  Take the 800 mg ibuprofen for pain.  Please make sure to follow-up with your psychiatrist as well.  Return to the ER if your symptoms worsen.
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
## 2019-08-15 NOTE — Progress Notes (Signed)
Orthopedic Tech Progress Note Patient Details:  Ricky Wu Aug 15, 1985 [id number]  Ortho Devices Type of Ortho Device: Ulna gutter splint, CAM walker Ortho Device/Splint Location: LLE/ULE Ortho Device/Splint Interventions: Application, Ordered   Post Interventions Patient Tolerated: Well Instructions Provided: Care of device  Spoke with PA and she decided to change Post. Short leg Splint to CAM walker with crutches.   [name] 08/15/2019, 6:53 PM

## 2019-08-19 ENCOUNTER — Other Ambulatory Visit: Payer: Self-pay

## 2019-08-19 ENCOUNTER — Ambulatory Visit (INDEPENDENT_AMBULATORY_CARE_PROVIDER_SITE_OTHER): Payer: Medicaid Other | Admitting: Physician Assistant

## 2019-08-19 ENCOUNTER — Encounter: Payer: Self-pay | Admitting: Orthopedic Surgery

## 2019-08-19 DIAGNOSIS — S82842S Displaced bimalleolar fracture of left lower leg, sequela: Secondary | ICD-10-CM

## 2019-08-19 NOTE — Progress Notes (Signed)
Office Visit Note   Patient: Ricky Wu           Date of Birth: 24-Dec-1985           MRN: [id number] Visit Date: 08/19/2019              Requested by: [facility] [address] [place],  [place] [address] PCP: Care, J[facility]  Chief Complaint  Patient presents with  . Left Ankle - Pain, Fracture      HPI: This is a pleasant 34 year old gentleman who is here complaining of left medial ankle and tibia pain.  He is status post ORIF bimalleolar ankle fracture 4 years ago performed by Dr. Sharol Given.  He actually did very well for a couple years but then had some increasing pain on the medial side of his of his tibia.  He denies any significant ankle joint pain.  He also gets fatigued when on his ankle for a long period of time.  He could not tolerate the cam walker boot.  He is currently using an Ace wrap denies any lateral pain  Assessment & Plan: Visit Diagnoses: No diagnosis found.  Plan: I think patient would benefit from a good Achilles stretching program I taught him this today.  He is to do this daily at least 3 times a day.  We will also provide him with an ASO brace to see if this helps support him better.  We will follow up in 4 weeks.  Follow-Up Instructions: No follow-ups on file.   Ortho Exam  Patient is alert, oriented, no adenopathy, well-dressed, normal affect, normal respiratory effort. Focused examination of his left ankle demonstrates decreased dorsiflexion.  He comes just to a neutral position.  He is tender to palpation over the medial tibia.  Mildly tender over the medial malleolus.  Not tender over the tip no lateral pain or impingement Recent x-rays taken demonstrate hardware intact without any evidence of loosening.  Well-maintained alignment.  He does have some avulsion loose bodies off the medial malleolus but these do not appear to be new  Imaging: No results found. No images are attached to the  encounter.  Labs: No results found for: HGBA1C, ESRSEDRATE, CRP, LABURIC, REPTSTATUS, GRAMSTAIN, CULT, LABORGA   Lab Results  Component Value Date   ALBUMIN 4.3 03/24/2018   ALBUMIN 4.4 03/18/2018   ALBUMIN 4.4 01/15/2018    No results found for: MG Lab Results  Component Value Date   VD25OH 15 (L) 05/17/2010    No results found for: PREALBUMIN CBC EXTENDED Latest Ref Rng & Units 10/23/2018 03/24/2018 03/18/2018  WBC 4.0 - 10.5 K/uL 8.2 12.5(H) 14.9(H)  RBC 4.22 - 5.81 MIL/uL 5.10 4.51 4.82  HGB 13.0 - 17.0 g/dL 16.7 14.5 15.4  HCT 39.0 - 52.0 % 48.8 43.3 45.5  PLT 150 - 400 K/uL 248 263 295  NEUTROABS 1.7 - 7.7 K/uL - - 9.9(H)  LYMPHSABS 0.7 - 4.0 K/uL - - 3.6     There is no height or weight on file to calculate BMI.  Orders:  No orders of the defined types were placed in this encounter.  No orders of the defined types were placed in this encounter.    Procedures: No procedures performed  Clinical Data: No additional findings.  ROS:  All other systems negative, except as noted in the HPI. Review of Systems  Objective: Vital Signs: There were no vitals taken for this  visit.  Specialty Comments:  No specialty comments available.  PMFS History: Patient Active Problem List   Diagnosis Date Noted  . Alcohol abuse 08/26/2016  . Bimalleolar ankle fracture 11/18/2015  . Memory loss 03/14/2013  . Confusion   . GAD (generalized anxiety disorder) 09/12/2012   Past Medical History:  Diagnosis Date  . ADHD (attention deficit hyperactivity disorder)   . Anhedonia   . Anxiety   . Anxiety disorder   . Confusion   . Depression   . Disorganized thought process   . Penis disorder   . Sleep apnea    mild no cpap or bipap    Family History  Problem Relation Age of Onset  . Hypertension Mother   . Diabetes Mother   . Cancer Father   . Arthritis Father   . Diabetes Other     Past Surgical History:  Procedure Laterality Date  . FRACTURE SURGERY    .  HAND SURGERY    . ORIF ANKLE FRACTURE Left 11/18/2015   Procedure: OPEN REDUCTION INTERNAL FIXATION (ORIF) LEFT ANKLE FRACTURE;  Surgeon: Nadara Mustard, MD;  Location: [facility] OR;  Service: Orthopedics;  Laterality: Left;   Social History   Occupational History    Comment: Does Not Work  Tobacco Use  . Smoking status: Current Every Day Smoker    Packs/day: 1.50    Years: 14.00    Pack years: 21.00    Types: Cigarettes  . Smokeless tobacco: Never Used  Substance and Sexual Activity  . Alcohol use: Yes    Comment: weekly  . Drug use: Yes    Types: Methamphetamines  . Sexual activity: Not on file

## 2019-08-28 ENCOUNTER — Other Ambulatory Visit: Payer: Self-pay

## 2019-08-28 ENCOUNTER — Other Ambulatory Visit (HOSPITAL_COMMUNITY)
Admission: RE | Admit: 2019-08-28 | Discharge: 2019-08-28 | Disposition: A | Discharge status: Home / Self Care | Payer: Medicaid Other | Source: Ambulatory Visit | Attending: Orthopedic Surgery | Admitting: Orthopedic Surgery

## 2019-08-28 ENCOUNTER — Other Ambulatory Visit: Payer: Self-pay | Admitting: Orthopedic Surgery

## 2019-08-28 ENCOUNTER — Encounter (HOSPITAL_BASED_OUTPATIENT_CLINIC_OR_DEPARTMENT_OTHER): Payer: Self-pay | Admitting: Orthopedic Surgery

## 2019-08-28 DIAGNOSIS — Z01812 Encounter for preprocedural laboratory examination: Secondary | ICD-10-CM | POA: Insufficient documentation

## 2019-08-28 DIAGNOSIS — S63055A Dislocation of other carpometacarpal joint of left hand, initial encounter: Secondary | ICD-10-CM | POA: Insufficient documentation

## 2019-08-28 DIAGNOSIS — Z20822 Contact with and (suspected) exposure to covid-19: Secondary | ICD-10-CM | POA: Insufficient documentation

## 2019-08-28 DIAGNOSIS — S62317A Displaced fracture of base of fifth metacarpal bone. left hand, initial encounter for closed fracture: Secondary | ICD-10-CM | POA: Insufficient documentation

## 2019-08-28 DIAGNOSIS — S62315A Displaced fracture of base of fourth metacarpal bone, left hand, initial encounter for closed fracture: Secondary | ICD-10-CM | POA: Insufficient documentation

## 2019-08-28 LAB — SARS CORONAVIRUS 2 (TAT 6-24 HRS): SARS Coronavirus 2: NEGATIVE

## 2019-08-29 ENCOUNTER — Ambulatory Visit (HOSPITAL_BASED_OUTPATIENT_CLINIC_OR_DEPARTMENT_OTHER): Payer: Medicaid Other | Admitting: Anesthesiology

## 2019-08-29 ENCOUNTER — Encounter (HOSPITAL_BASED_OUTPATIENT_CLINIC_OR_DEPARTMENT_OTHER): Payer: Self-pay | Admitting: Orthopedic Surgery

## 2019-08-29 ENCOUNTER — Encounter (HOSPITAL_BASED_OUTPATIENT_CLINIC_OR_DEPARTMENT_OTHER)
Admission: RE | Disposition: A | Discharge status: Home / Self Care | Payer: Self-pay | Source: Home / Self Care | Attending: Orthopedic Surgery

## 2019-08-29 ENCOUNTER — Other Ambulatory Visit: Payer: Self-pay

## 2019-08-29 ENCOUNTER — Ambulatory Visit (HOSPITAL_BASED_OUTPATIENT_CLINIC_OR_DEPARTMENT_OTHER)
Admission: RE | Admit: 2019-08-29 | Discharge: 2019-08-29 | Disposition: A | Discharge status: Home / Self Care | Payer: Medicaid Other | Attending: Orthopedic Surgery | Admitting: Orthopedic Surgery

## 2019-08-29 DIAGNOSIS — G4730 Sleep apnea, unspecified: Secondary | ICD-10-CM | POA: Diagnosis not present

## 2019-08-29 DIAGNOSIS — F17210 Nicotine dependence, cigarettes, uncomplicated: Secondary | ICD-10-CM | POA: Diagnosis not present

## 2019-08-29 DIAGNOSIS — K219 Gastro-esophageal reflux disease without esophagitis: Secondary | ICD-10-CM | POA: Diagnosis not present

## 2019-08-29 DIAGNOSIS — S62142A Displaced fracture of body of hamate [unciform] bone, left wrist, initial encounter for closed fracture: Secondary | ICD-10-CM | POA: Insufficient documentation

## 2019-08-29 DIAGNOSIS — W228XXA Striking against or struck by other objects, initial encounter: Secondary | ICD-10-CM | POA: Insufficient documentation

## 2019-08-29 DIAGNOSIS — F419 Anxiety disorder, unspecified: Secondary | ICD-10-CM | POA: Diagnosis not present

## 2019-08-29 DIAGNOSIS — S62307A Unspecified fracture of fifth metacarpal bone, left hand, initial encounter for closed fracture: Secondary | ICD-10-CM | POA: Diagnosis not present

## 2019-08-29 DIAGNOSIS — F909 Attention-deficit hyperactivity disorder, unspecified type: Secondary | ICD-10-CM | POA: Diagnosis not present

## 2019-08-29 DIAGNOSIS — Z20822 Contact with and (suspected) exposure to covid-19: Secondary | ICD-10-CM | POA: Insufficient documentation

## 2019-08-29 DIAGNOSIS — I10 Essential (primary) hypertension: Secondary | ICD-10-CM | POA: Diagnosis not present

## 2019-08-29 DIAGNOSIS — S62305A Unspecified fracture of fourth metacarpal bone, left hand, initial encounter for closed fracture: Secondary | ICD-10-CM | POA: Insufficient documentation

## 2019-08-29 HISTORY — DX: Essential (primary) hypertension: I10

## 2019-08-29 HISTORY — DX: Gastro-esophageal reflux disease without esophagitis: K21.9

## 2019-08-29 HISTORY — PX: OPEN REDUCTION INTERNAL FIXATION (ORIF) METACARPAL: SHX6234

## 2019-08-29 SURGERY — OPEN REDUCTION INTERNAL FIXATION (ORIF) METACARPAL
Anesthesia: Monitor Anesthesia Care | Site: Finger | Laterality: Left

## 2019-08-29 MED ORDER — PROPOFOL 500 MG/50ML IV EMUL
INTRAVENOUS | Status: DC | PRN
  Administered 2019-08-29: 75 ug/kg/min via INTRAVENOUS

## 2019-08-29 MED ORDER — DEXAMETHASONE SODIUM PHOSPHATE 10 MG/ML IJ SOLN
INTRAMUSCULAR | Status: DC | PRN
  Administered 2019-08-29: 5 mg

## 2019-08-29 MED ORDER — 0.9 % SODIUM CHLORIDE (POUR BTL) OPTIME
TOPICAL | Status: DC | PRN
  Administered 2019-08-29: 200 mL

## 2019-08-29 MED ORDER — FENTANYL CITRATE (PF) 100 MCG/2ML IJ SOLN: 25.0000 ug | INTRAMUSCULAR | Status: DC | PRN

## 2019-08-29 MED ORDER — FENTANYL CITRATE (PF) 100 MCG/2ML IJ SOLN
INTRAMUSCULAR | Status: AC
  Filled 2019-08-29: qty 2

## 2019-08-29 MED ORDER — PROPOFOL 10 MG/ML IV BOLUS
INTRAVENOUS | Status: AC
  Filled 2019-08-29: qty 40

## 2019-08-29 MED ORDER — MIDAZOLAM HCL 2 MG/2ML IJ SOLN
INTRAMUSCULAR | Status: AC
  Filled 2019-08-29: qty 2

## 2019-08-29 MED ORDER — CEFAZOLIN SODIUM-DEXTROSE 2-4 GM/100ML-% IV SOLN
INTRAVENOUS | Status: AC
  Filled 2019-08-29: qty 100

## 2019-08-29 MED ORDER — ONDANSETRON HCL 4 MG/2ML IJ SOLN
INTRAMUSCULAR | Status: DC | PRN
Start: 2019-08-29 — End: 2019-08-29
  Administered 2019-08-29: 4 mg via INTRAVENOUS

## 2019-08-29 MED ORDER — LIDOCAINE 2% (20 MG/ML) 5 ML SYRINGE
INTRAMUSCULAR | Status: DC | PRN
  Administered 2019-08-29: 60 mg via INTRAVENOUS

## 2019-08-29 MED ORDER — FENTANYL CITRATE (PF) 100 MCG/2ML IJ SOLN
100.0000 ug | Freq: Once | INTRAMUSCULAR | Status: AC
  Administered 2019-08-29: 100 ug via INTRAVENOUS

## 2019-08-29 MED ORDER — ONDANSETRON HCL 4 MG/2ML IJ SOLN: 4.0000 mg | Freq: Once | INTRAMUSCULAR | Status: DC | PRN

## 2019-08-29 MED ORDER — OXYCODONE-ACETAMINOPHEN 5-325 MG PO TABS: ORAL_TABLET | ORAL | 0 refills | Status: DC

## 2019-08-29 MED ORDER — OXYCODONE HCL 5 MG PO TABS: 5.0000 mg | ORAL_TABLET | Freq: Once | ORAL | Status: DC | PRN

## 2019-08-29 MED ORDER — ROPIVACAINE HCL 5 MG/ML IJ SOLN
INTRAMUSCULAR | Status: DC | PRN
  Administered 2019-08-29: 30 mL via PERINEURAL

## 2019-08-29 MED ORDER — LACTATED RINGERS IV SOLN: INTRAVENOUS | Status: DC

## 2019-08-29 MED ORDER — MIDAZOLAM HCL 2 MG/2ML IJ SOLN
2.0000 mg | Freq: Once | INTRAMUSCULAR | Status: AC
  Administered 2019-08-29: 2 mg via INTRAVENOUS

## 2019-08-29 MED ORDER — OXYCODONE HCL 5 MG/5ML PO SOLN: 5.0000 mg | Freq: Once | ORAL | Status: DC | PRN

## 2019-08-29 MED ORDER — CEFAZOLIN SODIUM-DEXTROSE 2-4 GM/100ML-% IV SOLN
2.0000 g | INTRAVENOUS | Status: AC
  Administered 2019-08-29: 2 g via INTRAVENOUS

## 2019-08-29 SURGICAL SUPPLY — 63 items
APL PRP STRL LF DISP 70% ISPRP (MISCELLANEOUS) ×1
BLADE MINI RND TIP GREEN BEAV (BLADE) ×2 IMPLANT
BLADE SURG 15 STRL LF DISP TIS (BLADE) ×2 IMPLANT
BLADE SURG 15 STRL SS (BLADE) ×6
BNDG CMPR 9X4 STRL LF SNTH (GAUZE/BANDAGES/DRESSINGS) ×1
BNDG ELASTIC 2X5.8 VLCR STR LF (GAUZE/BANDAGES/DRESSINGS) IMPLANT
BNDG ELASTIC 3X5.8 VLCR STR LF (GAUZE/BANDAGES/DRESSINGS) ×3 IMPLANT
BNDG ESMARK 4X9 LF (GAUZE/BANDAGES/DRESSINGS) ×3 IMPLANT
BNDG GAUZE ELAST 4 BULKY (GAUZE/BANDAGES/DRESSINGS) ×3 IMPLANT
CHLORAPREP W/TINT 26 (MISCELLANEOUS) ×3 IMPLANT
CORD BIPOLAR FORCEPS 12FT (ELECTRODE) ×3 IMPLANT
COVER BACK TABLE 60X90IN (DRAPES) ×3 IMPLANT
COVER MAYO STAND STRL (DRAPES) ×3 IMPLANT
COVER WAND RF STERILE (DRAPES) IMPLANT
CUFF TOURN SGL QUICK 18X4 (TOURNIQUET CUFF) ×3 IMPLANT
DRAPE EXTREMITY T 121X128X90 (DISPOSABLE) ×3 IMPLANT
DRAPE OEC MINIVIEW 54X84 (DRAPES) ×3 IMPLANT
DRAPE SURG 17X23 STRL (DRAPES) ×3 IMPLANT
GAUZE SPONGE 4X4 12PLY STRL (GAUZE/BANDAGES/DRESSINGS) ×3 IMPLANT
GAUZE XEROFORM 1X8 LF (GAUZE/BANDAGES/DRESSINGS) ×3 IMPLANT
GLOVE BIO SURGEON STRL SZ 6.5 (GLOVE) ×1 IMPLANT
GLOVE BIO SURGEON STRL SZ7.5 (GLOVE) ×3 IMPLANT
GLOVE BIO SURGEONS STRL SZ 6.5 (GLOVE) ×1
GLOVE BIOGEL PI IND STRL 7.0 (GLOVE) IMPLANT
GLOVE BIOGEL PI IND STRL 8 (GLOVE) ×1 IMPLANT
GLOVE BIOGEL PI IND STRL 8.5 (GLOVE) IMPLANT
GLOVE BIOGEL PI INDICATOR 7.0 (GLOVE) ×2
GLOVE BIOGEL PI INDICATOR 8 (GLOVE) ×2
GLOVE BIOGEL PI INDICATOR 8.5 (GLOVE) ×2
GLOVE SURG ORTHO 8.0 STRL STRW (GLOVE) ×2 IMPLANT
GOWN STRL REUS W/ TWL LRG LVL3 (GOWN DISPOSABLE) ×1 IMPLANT
GOWN STRL REUS W/TWL LRG LVL3 (GOWN DISPOSABLE) ×3
GOWN STRL REUS W/TWL XL LVL3 (GOWN DISPOSABLE) ×5 IMPLANT
K-WIRE .045X4 (WIRE) ×10 IMPLANT
NDL HYPO 25X1 1.5 SAFETY (NEEDLE) IMPLANT
NEEDLE HYPO 22GX1.5 SAFETY (NEEDLE) IMPLANT
NEEDLE HYPO 25X1 1.5 SAFETY (NEEDLE) ×3 IMPLANT
NS IRRIG 1000ML POUR BTL (IV SOLUTION) ×3 IMPLANT
PAD CAST 3X4 CTTN HI CHSV (CAST SUPPLIES) ×1 IMPLANT
PAD CAST 4YDX4 CTTN HI CHSV (CAST SUPPLIES) ×1 IMPLANT
PADDING CAST ABS 4INX4YD NS (CAST SUPPLIES)
PADDING CAST ABS COTTON 4X4 ST (CAST SUPPLIES) ×1 IMPLANT
PADDING CAST COTTON 3X4 STRL (CAST SUPPLIES) ×3
PADDING CAST COTTON 4X4 STRL (CAST SUPPLIES) ×3
SET BASIN DAY SURGERY F.S. (CUSTOM PROCEDURE TRAY) ×3 IMPLANT
SLEEVE SCD COMPRESS KNEE MED (MISCELLANEOUS) ×2 IMPLANT
SLING ARM FOAM STRAP LRG (SOFTGOODS) ×2 IMPLANT
SPLINT PLASTER CAST XFAST 3X15 (CAST SUPPLIES) IMPLANT
SPLINT PLASTER CAST XFAST 4X15 (CAST SUPPLIES) IMPLANT
SPLINT PLASTER XTRA FAST SET 4 (CAST SUPPLIES)
SPLINT PLASTER XTRA FASTSET 3X (CAST SUPPLIES)
STOCKINETTE 4X48 STRL (DRAPES) ×3 IMPLANT
SUT CHROMIC 4 0 PS 2 18 (SUTURE) ×3 IMPLANT
SUT ETHILON 3 0 PS 1 (SUTURE) IMPLANT
SUT ETHILON 4 0 PS 2 18 (SUTURE) ×3 IMPLANT
SUT MERSILENE 4 0 P 3 (SUTURE) IMPLANT
SUT VIC AB 3-0 PS1 18 (SUTURE)
SUT VIC AB 3-0 PS1 18XBRD (SUTURE) IMPLANT
SUT VICRYL 4-0 PS2 18IN ABS (SUTURE) ×3 IMPLANT
SYR BULB EAR ULCER 3OZ GRN STR (SYRINGE) ×3 IMPLANT
SYR CONTROL 10ML LL (SYRINGE) ×2 IMPLANT
TOWEL GREEN STERILE FF (TOWEL DISPOSABLE) ×6 IMPLANT
UNDERPAD 30X36 HEAVY ABSORB (UNDERPADS AND DIAPERS) ×3 IMPLANT

## 2019-08-29 NOTE — Transfer of Care (Signed)
Immediate Anesthesia Transfer of Care Note  Patient: Ricky Wu  Procedure(s) Performed: LEFT SMALL AND RING FINGER OPEN REDUCTION INTERNAL FIXATION (ORIF) METACARPAL (Left Finger)  Patient Location: PACU  Anesthesia Type:MAC combined with regional for post-op pain  Level of Consciousness: awake, alert , oriented and patient cooperative  Airway & Oxygen Therapy: Patient Spontanous Breathing and Patient connected to face mask oxygen  Post-op Assessment: Report given to RN and Post -op Vital signs reviewed and stable  Post vital signs: Reviewed and stable  Last Vitals:  Vitals Value Taken Time  BP    Temp    Pulse 65 08/29/19 1454  Resp 15 08/29/19 1454  SpO2 97 % 08/29/19 1454  Vitals shown include unvalidated device data.  Last Pain:  Vitals:   08/29/19 1236  TempSrc: Oral  PainSc: 2          Complications: No apparent anesthesia complications

## 2019-08-29 NOTE — H&P (Signed)
Ricky Wu is an 34 y.o. male.   Chief Complaint: left hand fracture HPI: 34 yo male states he punched ground 2.5 weeks ago injuring left hand.  Seen in ED where XR revealed left ring and small finger metacarpal fractures.  Splinted and followed up in office.  He wishes to proceed with operative fixation of the left ring and small finger fracture dislocations.  Allergies: No Known Allergies  Past Medical History:  Diagnosis Date  . ADHD (attention deficit hyperactivity disorder)   . Anhedonia   . Anxiety   . Anxiety disorder   . Confusion   . Depression   . Disorganized thought process   . GERD (gastroesophageal reflux disease)   . Hypertension   . Penis disorder   . Sleep apnea    mild no cpap or bipap    Past Surgical History:  Procedure Laterality Date  . FRACTURE SURGERY    . HAND SURGERY    . ORIF ANKLE FRACTURE Left 11/18/2015   Procedure: OPEN REDUCTION INTERNAL FIXATION (ORIF) LEFT ANKLE FRACTURE;  Surgeon: Nadara Mustard, MD;  Location: [facility] OR;  Service: Orthopedics;  Laterality: Left;    Family History: Family History  Problem Relation Age of Onset  . Hypertension Mother   . Diabetes Mother   . Cancer Father   . Arthritis Father   . Diabetes Other     Social History:   reports that he has been smoking cigarettes. He has a 21.00 pack-year smoking history. He has never used smokeless tobacco. He reports current alcohol use. He reports previous drug use.  Medications: No medications prior to admission.    Results for orders placed or performed during the hospital encounter of 08/28/19 (from the past 48 hour(s))  SARS CORONAVIRUS 2 (TAT 6-24 HRS) Nasopharyngeal Nasopharyngeal Swab     Status: None   Collection Time: 08/28/19  1:22 PM   Specimen: Nasopharyngeal Swab  Result Value Ref Range   SARS Coronavirus 2 NEGATIVE NEGATIVE    Comment: (NOTE) SARS-CoV-2 target nucleic acids are NOT DETECTED. The SARS-CoV-2 RNA is generally detectable in upper and  lower respiratory specimens during the acute phase of infection. Negative results do not preclude SARS-CoV-2 infection, do not rule out co-infections with other pathogens, and should not be used as the sole basis for treatment or other patient management decisions. Negative results must be combined with clinical observations, patient history, and epidemiological information. The expected result is Negative. Fact Sheet for Patients: HairSlick.no Fact Sheet for Healthcare Providers: quierodirigir.com This test is not yet approved or cleared by the [place] FDA and  has been authorized for detection and/or diagnosis of SARS-CoV-2 by FDA under an Emergency Use Authorization (EUA). This EUA will remain  in effect (meaning this test can be used) for the duration of the COVID-19 declaration under Section 56 4(b)(1) of the Act, 21 U.S.C. section 360bbb-3(b)(1), unless the authorization is terminated or revoked sooner. Performed at [facility], [address], [place], [place] [address]     No results found.   A comprehensive review of systems was negative.  Height 6\' 2"  (1.88 m), weight 104.3 kg.  General appearance: alert, cooperative and appears stated age Head: Normocephalic, without obvious abnormality, atraumatic Neck: supple, symmetrical, trachea midline Cardio: regular rate and rhythm Resp: clear to auscultation bilaterally Extremities: Intact sensation and capillary refill all digits.  +epl/fpl/io.  No wounds.  Pulses: 2+ and symmetric Skin: Skin color, texture, turgor normal. No rashes or lesions Neurologic:  Grossly normal Incision/Wound: none  Assessment/Plan Left ring and small finger metacarpal fractures with cmc dislocation.  Plan operative reduction and fixation.  Non operative and operative treatment options have been discussed with the patient and patient wishes to proceed with operative treatment.  Risks, benefits, and alternatives of surgery have been discussed and the patient agrees with the plan of care.   Leanora Cover 08/29/2019, 10:44 AM

## 2019-08-29 NOTE — Anesthesia Preprocedure Evaluation (Signed)
Anesthesia Evaluation  Patient identified by MRN, date of birth, ID band Patient awake    Reviewed: Allergy & Precautions, NPO status , Patient's Chart, lab work & pertinent test results  History of Anesthesia Complications Negative for: history of anesthetic complications  Airway Mallampati: II  TM Distance: >3 FB Neck ROM: Full    Dental   Pulmonary sleep apnea , Current Smoker and Patient abstained from smoking.,    Pulmonary exam normal        Cardiovascular hypertension, Normal cardiovascular exam     Neuro/Psych Anxiety Depression negative neurological ROS     GI/Hepatic Neg liver ROS, GERD  ,  Endo/Other  negative endocrine ROS  Renal/GU negative Renal ROS  negative genitourinary   Musculoskeletal negative musculoskeletal ROS (+)   Abdominal   Peds  (+) ATTENTION DEFICIT DISORDER WITHOUT HYPERACTIVITY Hematology negative hematology ROS (+)   Anesthesia Other Findings   Reproductive/Obstetrics                            Anesthesia Physical Anesthesia Plan  ASA: II  Anesthesia Plan: MAC and Regional   Post-op Pain Management:  Regional for Post-op pain   Induction: Intravenous  PONV Risk Score and Plan: 1 and Propofol infusion, TIVA and Treatment may vary due to age or medical condition  Airway Management Planned: Natural Airway, Nasal Cannula and Simple Face Mask  Additional Equipment: None  Intra-op Plan:   Post-operative Plan:   Informed Consent: I have reviewed the patients History and Physical, chart, labs and discussed the procedure including the risks, benefits and alternatives for the proposed anesthesia with the patient or authorized representative who has indicated his/her understanding and acceptance.       Plan Discussed with:   Anesthesia Plan Comments:         Anesthesia Quick Evaluation

## 2019-08-29 NOTE — Progress Notes (Signed)
Assisted Dr. Witman with left, ultrasound guided, supraclavicular block. Side rails up, monitors on throughout procedure. See vital signs in flow sheet. Tolerated Procedure well. °

## 2019-08-29 NOTE — Op Note (Signed)
NAME: Ricky Wu MEDICAL RECORD NO: [id number] DATE OF BIRTH: January 24, 1986 FACILITY[facility] LOCATION: [facility] PHYSICIAN: Tennis Must, MD   OPERATIVE REPORT   DATE OF PROCEDURE: 08/29/19    PREOPERATIVE DIAGNOSIS:   Left ring and small finger CMC fracture dislocation   POSTOPERATIVE DIAGNOSIS:   1.  Left small finger CMC fracture dislocation 2.  Left ring finger metacarpal fracture 3.  Left hamate fracture   PROCEDURE:   1.  Open reduction pin fixation of left ring finger metacarpal fracture 2.  Open reduction pin fixation of left small finger CMC fracture dislocation 3.  Open reduction suture fixation left hamate fracture   SURGEON:  Leanora Cover, M.D.   ASSISTANT: Daryll Brod, MD   ANESTHESIA:  Regional with sedation   INTRAVENOUS FLUIDS:  Per anesthesia flow sheet.   ESTIMATED BLOOD LOSS:  Minimal.   COMPLICATIONS:  None.   SPECIMENS:  none   TOURNIQUET TIME:    Total Tourniquet Time Documented: Upper Arm (Left) - 65 minutes Total: Upper Arm (Left) - 65 minutes    DISPOSITION:  Stable to PACU.   INDICATIONS: 34 year old male states approximately 2-1/2 to 3 weeks ago he punched the ground injuring his left hand.  He was seen at the emergency department where radiographs were taken revealing ring and small finger metacarpal base fractures.  He was splinted and followed up in the office.  He wishes to proceed with operative fixation. Risks, benefits and alternatives of surgery were discussed including the risks of blood loss, infection, damage to nerves, vessels, tendons, ligaments, bone for surgery, need for additional surgery, complications with wound healing, continued pain, nonunion, malunion, stiffness, posttraumatic arthritis.  He voiced understanding of these risks and elected to proceed.  OPERATIVE COURSE:  After being identified preoperatively by myself,  the patient and I agreed on the procedure and site of the procedure.  The surgical  site was marked.  Surgical consent had been signed. He was given IV antibiotics as preoperative antibiotic prophylaxis. He was transferred to the operating room and placed on the operating table in supine position with the Left upper extremity on an arm board.  Sedation was induced by the anesthesiologist. A regional block had been performed by anesthesia in preoperative holding.   Left upper extremity was prepped and draped in normal sterile orthopedic fashion.  A surgical pause was performed between the surgeons, anesthesia, and operating room staff and all were in agreement as to the patient, procedure, and site of procedure.  Tourniquet at the proximal aspect of the extremity was inflated to 250 mmHg after exsanguination of the arm with an Esmarch bandage.    Close reduction attempts were made.  0.045 inch K wires were advanced from the ulnar side of the hand across the metacarpals and into the carpus.  Radiographs taken after pin fixation showed continued dorsal subluxation of metacarpal base.  It was felt that open reduction was appropriate.  Incision was made at the dorsum of the hand between the ring and small finger metacarpals and over the [facility] joints.  This is carried into the subcutaneous tissues by spreading technique.  Bipolar electrocautery was used to obtain hemostasis.  The CMC joints were identified.  There was a oblique fracture of the ring finger metacarpal in the coronal plane.  The [facility] joint was reduced.  There was fracture of the dorsal rim of the hamate with loss of visualization of the articular surface.  There was some bone within the  soft tissues dorsally.  This was preserved.  The base of the small finger metacarpal was fractured and the dorsal fragment is dislocated dorsally.  This was mostly a shell of articular cartilage.  The ring finger metacarpal was mobilized and the fracture reduced under direct visualization.  Except above reduction was obtained.  The small finger metacarpal  fracture was also mobilized.  The articular fragment was able to be rotated back into place and reduced into the [facility] joint.  There was loss of some of the dorsal rim of hamate.  0.045 inch K wires were then used.  One was advanced in an oblique fashion across the ring finger metacarpal fracture and into the carpus.  A second was advanced from distal to the small finger metacarpal fracture and into the carpus.  2 were then advanced from the ulnar side of the small finger metacarpal and 1 into the ring finger metacarpal and 1 through the ring finger metacarpal and into the long finger metacarpal.  This is adequate to stabilize the fractures.  C-arm was used in AP lateral and oblique projections to ensure appropriate reduction position of heart which was the case.  The dorsal base of the metacarpal still seemed prominent though this is due to the loss of the hamate rim dorsally.  The pins were bent and cut short.  The wound was copiously irrigated with sterile saline.  The hamate fragments were reduced.  5-0 chromic suture was used to secure the soft tissues over the hamate fragments holding them in place.  Skin was closed with 4-0 nylon in a horizontal mattress fashion.  The wound and pin sites were dressed with sterile Xeroform 4 x 4's and wrapped with a Kerlix bandage.  A volar dorsal slab splint including the long ring and small fingers was placed with the MPs flexed and the IP is extended.  This was wrapped with Kerlix and Ace bandage.  The tourniquet was deflated at 65 minutes.  Fingertips were pink with brisk capillary refill after deflation of tourniquet.  The operative  drapes were broken down.  The patient was awoken from anesthesia safely.  He was transferred back to the stretcher and taken to PACU in stable condition.  I will see him back in the office in 1 week for postoperative followup.  I will give him a prescription for Percocet 5/325 1-2 tabs PO q6 hours prn pain, dispense # 30.   Betha Loa,  MD Electronically signed, 08/29/19

## 2019-08-29 NOTE — Anesthesia Procedure Notes (Signed)
Anesthesia Regional Block: Supraclavicular block   Pre-Anesthetic Checklist: ,, timeout performed, Correct Patient, Correct Site, Correct Laterality, Correct Procedure, Correct Position, site marked, Risks and benefits discussed,  Surgical consent,  Pre-op evaluation,  At surgeon's request and post-op pain management  Laterality: Left  Prep: chloraprep       Needles:  Injection technique: Single-shot  Needle Type: Echogenic Stimulator Needle     Needle Length: 10cm  Needle Gauge: 20     Additional Needles:   Procedures:,,,, ultrasound used (permanent image in chart),,,,  Narrative:  Start time: 08/29/2019 12:50 PM End time: 08/29/2019 12:55 PM Injection made incrementally with aspirations every 5 mL.  Performed by: Personally  Anesthesiologist: Lucretia Kern, MD  Additional Notes: Monitors applied. Injection made in 5cc increments. No resistance to injection. Good needle visualization. Patient tolerated procedure well.

## 2019-08-29 NOTE — Discharge Instructions (Addendum)
[facility] Instructions Hand Surgery  Wound Care: Keep your hand elevated above the level of your heart.  Do not allow it to dangle by your side.  Keep the dressing dry and do not remove it unless your doctor advises you to do so.  He will usually change it at the time of your post-op visit.  Moving your fingers is advised to stimulate circulation but will depend on the site of your surgery.  If you have a splint applied, your doctor will advise you regarding movement.  Activity: Do not drive or operate machinery today.  Rest today and then you may return to your normal activity and work as indicated by your physician.  Diet:  Drink liquids today or eat a light diet.  You may resume a regular diet tomorrow.    General expectations: Pain for two to three days. Fingers may become slightly swollen.  Call your doctor if any of the following occur: Severe pain not relieved by pain medication. Elevated temperature. Dressing soaked with blood. Inability to move fingers. White or bluish color to fingers.   Post Anesthesia Home Care Instructions  Activity: Get plenty of rest for the remainder of the day. A responsible individual must stay with you for 24 hours following the procedure.  For the next 24 hours, DO NOT: -Drive a car -Operate machinery -Drink alcoholic beverages -Take any medication unless instructed by your physician -Make any legal decisions or sign important papers.  Meals: Start with liquid foods such as gelatin or soup. Progress to regular foods as tolerated. Avoid greasy, spicy, heavy foods. If nausea and/or vomiting occur, drink only clear liquids until the nausea and/or vomiting subsides. Call your physician if vomiting continues.  Special Instructions/Symptoms: Your throat may feel dry or sore from the anesthesia or the breathing tube placed in your throat during surgery. If this causes discomfort, gargle with warm salt water. The discomfort should disappear within  24 hours.     Regional Anesthesia Blocks  1. Numbness or the inability to move the "blocked" extremity may last from 3-48 hours after placement. The length of time depends on the medication injected and your individual response to the medication. If the numbness is not going away after 48 hours, call your surgeon.  2. The extremity that is blocked will need to be protected until the numbness is gone and the  Strength has returned. Because you cannot feel it, you will need to take extra care to avoid injury. Because it may be weak, you may have difficulty moving it or using it. You may not know what position it is in without looking at it while the block is in effect.  3. For blocks in the legs and feet, returning to weight bearing and walking needs to be done carefully. You will need to wait until the numbness is entirely gone and the strength has returned. You should be able to move your leg and foot normally before you try and bear weight or walk. You will need someone to be with you when you first try to ensure you do not fall and possibly risk injury.  4. Bruising and tenderness at the needle site are common side effects and will resolve in a few days.  5. Persistent numbness or new problems with movement should be communicated to the surgeon or the [facility] ([phone])/ [facility] ([phone]).  

## 2019-08-29 NOTE — Op Note (Signed)
I assisted Surgeon(s) and Role:    * Betha Loa, MD - Primary    Cindee Salt, MD - Assisting on the Procedure(s): LEFT SMALL AND RING FINGER OPEN REDUCTION INTERNAL FIXATION (ORIF) METACARPAL on 08/29/2019.  I provided assistance on this case as follows: Set up, approach, tempted pinning unsuccessful open incision, debridement of the fractures, reduction of the fracture, stabilization of the fractures, fixation of the fractures, closure of the wounds, and application of dressing and splints. Electronically signed by: Cindee Salt, MD Date: 08/29/2019 Time: 2:51 PM

## 2019-08-29 NOTE — Anesthesia Postprocedure Evaluation (Signed)
Anesthesia Post Note  Patient: Ricky Wu  Procedure(s) Performed: LEFT SMALL AND RING FINGER OPEN REDUCTION INTERNAL FIXATION (ORIF) METACARPAL (Left Finger)     Patient location during evaluation: PACU Anesthesia Type: Regional Level of consciousness: awake and alert Pain management: pain level controlled Vital Signs Assessment: post-procedure vital signs reviewed and stable Respiratory status: spontaneous breathing, nonlabored ventilation and respiratory function stable Cardiovascular status: blood pressure returned to baseline and stable Postop Assessment: no apparent nausea or vomiting Anesthetic complications: no    Last Vitals:  Vitals:   08/29/19 1453 08/29/19 1454  BP:  125/76  Pulse: 63 65  Resp:  15  Temp:    SpO2: 94% 97%    Last Pain:  Vitals:   08/29/19 1236  TempSrc: Oral  PainSc: 2                  Lucretia Kern

## 2019-08-30 ENCOUNTER — Encounter: Payer: Self-pay | Admitting: *Deleted

## 2019-09-16 ENCOUNTER — Ambulatory Visit: Payer: Medicaid Other | Admitting: Orthopedic Surgery

## 2019-09-23 ENCOUNTER — Ambulatory Visit: Payer: Medicaid Other | Admitting: Orthopedic Surgery

## 2019-10-03 ENCOUNTER — Ambulatory Visit: Payer: Medicaid Other | Admitting: Orthopedic Surgery

## 2020-06-14 ENCOUNTER — Emergency Department (HOSPITAL_COMMUNITY)
Admission: EM | Admit: 2020-06-14 | Discharge: 2020-06-14 | Discharge status: Against Medical Advice | Payer: Medicaid Other

## 2020-06-14 NOTE — ED Notes (Signed)
Pt called x2 for triage, no answer.

## 2020-06-14 NOTE — ED Notes (Signed)
Called pt for vitals and no answer  

## 2020-06-24 ENCOUNTER — Ambulatory Visit: Payer: Medicaid Other | Admitting: Physician Assistant

## 2020-11-05 ENCOUNTER — Encounter (HOSPITAL_COMMUNITY): Payer: Self-pay | Admitting: Emergency Medicine

## 2020-11-05 ENCOUNTER — Ambulatory Visit (HOSPITAL_COMMUNITY)
Admission: EM | Admit: 2020-11-05 | Discharge: 2020-11-05 | Disposition: A | Discharge status: Home / Self Care | Payer: Medicaid Other

## 2020-11-05 ENCOUNTER — Other Ambulatory Visit: Payer: Self-pay

## 2020-11-05 DIAGNOSIS — F411 Generalized anxiety disorder: Secondary | ICD-10-CM | POA: Diagnosis not present

## 2020-11-05 DIAGNOSIS — L02414 Cutaneous abscess of left upper limb: Secondary | ICD-10-CM | POA: Diagnosis not present

## 2020-11-05 MED ORDER — SERTRALINE HCL 100 MG PO TABS: 100.0000 mg | ORAL_TABLET | Freq: Every day | ORAL | 1 refills | Status: DC

## 2020-11-05 MED ORDER — DOXYCYCLINE HYCLATE 100 MG PO CAPS
100.0000 mg | ORAL_CAPSULE | Freq: Two times a day (BID) | ORAL | 0 refills | Status: DC
Start: 2020-11-05 — End: 2021-07-24

## 2020-11-05 MED ORDER — LORAZEPAM 2 MG/ML IJ SOLN
INTRAMUSCULAR | Status: AC
  Filled 2020-11-05: qty 1

## 2020-11-05 MED ORDER — LORAZEPAM 2 MG/ML IJ SOLN
1.0000 mg | Freq: Once | INTRAMUSCULAR | Status: AC
  Administered 2020-11-05: 1 mg via INTRAMUSCULAR

## 2020-11-05 MED ORDER — AMPHETAMINE-DEXTROAMPHET ER 10 MG PO CP24: 10.0000 mg | ORAL_CAPSULE | Freq: Every day | ORAL | 0 refills | Status: DC

## 2020-11-05 NOTE — Discharge Instructions (Addendum)
Take doxycyline twice a day for 7 days  Hold warm compresses to area at least 4 times a day to help drain  May follow-up if area continues to enlarge, becomes more painful or tender, is nondraining or nonhealing, do not attempt to burst area, allow it to open up and drain naturally  Continue taking zoloft as prescribed, continue taking Adderall as prescribed  If unable to get appointment with behavioral health physician, may follow-up [facility] for evaluation

## 2020-11-05 NOTE — ED Triage Notes (Signed)
Abscess on left forearm.  Surfaced 2 weeks ago. Site has redness around wound.  Has used peroxide.  Patient tried to squeeze contents, minimal drainage.  Patient has concerns for anxiety today.

## 2020-11-05 NOTE — ED Notes (Signed)
Flight of ideas, anxious.

## 2020-11-12 NOTE — ED Provider Notes (Signed)
[facility]    CSN: [id number] Arrival date & time: 11/05/20  0944      History   Chief Complaint Chief Complaint  Patient presents with   Abscess    HPI Ricky Wu is a 35 y.o. male.   Patient presents with abscess on left lower arm for 2 weeks, area is swollen, tender, red and warm.  Has noticed some drainage but has also been picking at area, no drainage today.  Has also used peroxide over area with no success.  Denies fever and chills.  Patient concerned with anxiety feels like he is very worked up and anxiety has heightened this morning, no change in daily routine or recent events.  History of depression, generalized anxiety disorder, ADHD.  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  Requesting medication refill, attests to attempting to get appointment with behavioral health center and was unable.  Past Medical History:  Diagnosis Date   ADHD (attention deficit hyperactivity disorder)    Anhedonia    Anxiety    Anxiety disorder    Confusion    Depression    Disorganized thought process    GERD (gastroesophageal reflux disease)    Hypertension    Penis disorder    Sleep apnea    mild no cpap or bipap    Patient Active Problem List   Diagnosis Date Noted   Alcohol abuse 08/26/2016   Bimalleolar ankle fracture 11/18/2015   Memory loss 03/14/2013   Confusion    GAD (generalized anxiety disorder) 09/12/2012    Past Surgical History:  Procedure Laterality Date   FRACTURE SURGERY     HAND SURGERY     OPEN REDUCTION INTERNAL FIXATION (ORIF) METACARPAL Left 08/29/2019   Procedure: LEFT SMALL AND RING FINGER OPEN REDUCTION INTERNAL FIXATION (ORIF) METACARPAL;  Surgeon: Betha Loa, MD;  Location: [facility];  Service: Orthopedics;  Laterality: Left;   ORIF ANKLE FRACTURE Left 11/18/2015   Procedure: OPEN REDUCTION INTERNAL FIXATION (ORIF) LEFT ANKLE FRACTURE;  Surgeon: Nadara Mustard, MD;  Location: [facility];  Service:  Orthopedics;  Laterality: Left;       Home Medications    Prior to Admission medications   Medication Sig Start Date End Date Taking? Authorizing Provider  aspirin 325 MG EC tablet Take 325 mg by mouth daily.   Yes [provider]  doxycycline (VIBRAMYCIN) 100 MG capsule Take 1 capsule (100 mg total) by mouth 2 (two) times daily. 11/05/20  Yes Bruce Mayers R, NP  gabapentin (NEURONTIN) 400 MG capsule Take 400 mg by mouth 2 (two) times a day. 09/10/18  Yes [provider]  amLODipine (NORVASC) 2.5 MG tablet Take 2.5 mg by mouth daily. Patient not taking: Reported on 11/05/2020 12/21/18   [provider]  amphetamine-dextroamphetamine (ADDERALL XR) 10 MG 24 hr capsule Take 1 capsule (10 mg total) by mouth daily. 11/05/20   Reka Wist, Elita Boone, NP  ibuprofen (ADVIL) 800 MG tablet Take 1 tablet (800 mg total) by mouth 3 (three) times daily. 08/15/19   Mare Ferrari, PA-C  oxyCODONE-acetaminophen (PERCOCET) 5-325 MG tablet 1-2 tabs PO q6 hours prn pain Patient not taking: Reported on 11/05/2020 08/29/19   Betha Loa, MD  pantoprazole (PROTONIX) 40 MG tablet Take 1 tablet (40 mg total) by mouth daily. 03/18/18   Pricilla Loveless, MD  sertraline (ZOLOFT) 100 MG tablet Take 1 tablet (100 mg total) by mouth daily. 11/05/20   Valinda Hoar, NP  Family History Family History  Problem Relation Age of Onset   Hypertension Mother    Diabetes Mother    Cancer Father    Arthritis Father    Diabetes Other     Social History Social History   Tobacco Use   Smoking status: Every Day    Packs/day: 1.50    Years: 14.00    Pack years: 21.00    Types: Cigarettes   Smokeless tobacco: Never  Vaping Use   Vaping Use: Never used  Substance Use Topics   Alcohol use: Yes    Comment: rarely   Drug use: Yes    Comment: has not used any drugs in one year     Allergies   Patient has no known allergies.   Review of Systems Review of Systems  Constitutional:  Negative.   HENT: Negative.    Respiratory: Negative.    Cardiovascular: Negative.   Skin:  Positive for wound. Negative for color change, pallor and rash.  Psychiatric/Behavioral:  Negative for agitation, behavioral problems, confusion, decreased concentration, dysphoric mood, hallucinations, self-injury, sleep disturbance and suicidal ideas. The patient is nervous/anxious. The patient is not hyperactive.     Physical Exam Triage Vital Signs ED Triage Vitals  Enc Vitals Group     BP 11/05/20 1029 (!) 144/103     Pulse Rate 11/05/20 1029 (!) 114     Resp 11/05/20 1029 (!) 22     Temp 11/05/20 1029 98.5 F (36.9 C)     Temp Source 11/05/20 1029 Oral     SpO2 11/05/20 1029 100 %     Weight --      Height --      Head Circumference --      Peak Flow --      Pain Score 11/05/20 1024 6     Pain Loc --      Pain Edu? --      Excl. in GC? --    No data found.  Updated Vital Signs BP (!) 144/103 (BP Location: Right Arm)   Pulse (!) 114   Temp 98.5 F (36.9 C) (Oral)   Resp (!) 22   SpO2 100%   Visual Acuity Right Eye Distance:   Left Eye Distance:   Bilateral Distance:    Right Eye Near:   Left Eye Near:    Bilateral Near:     Physical Exam Constitutional:      Appearance: Normal appearance. He is normal weight.  Eyes:     Extraocular Movements: Extraocular movements intact.  Skin:    Comments: Warm and erythematous immature 2 x 3 cm abscess present on the medial aspect of the left forearm, tender and swollen, excoriation present over abscess, scattered excoriations and abrasions throughout bilateral upper and lower arms and abdomen  Neurological:     Mental Status: He is alert and oriented to person, place, and time. Mental status is at baseline.  Psychiatric:        Mood and Affect: Mood is anxious.        Behavior: Behavior normal.     UC Treatments / Results  Labs (all labs ordered are listed, but only abnormal results are displayed) Labs Reviewed - No  data to display  EKG   Radiology No results found.  Procedures Procedures (including critical care time)  Medications Ordered in UC Medications  LORazepam (ATIVAN) injection 1 mg (1 mg Intramuscular Given 11/05/20 1124)    Initial Impression / Assessment and Plan / UC  Course  I have reviewed the triage vital signs and the nursing notes.  Pertinent labs & imaging results that were available during my care of the patient were reviewed by me and considered in my medical decision making (see chart for details).  Abscess of left arm Generalized anxiety disorder  1.  Doxycycline 100 mg daily for 7 days 2.  Warm compresses to affected area at least 4 times a day 3.  Patient advised to stop picking and scratching at skin due to increased risk of infection 4.  Zoloft 100 mg daily refilled for 1 month 5.  Adderall XR 10 mg daily, 10 capsules refilled 6.  Lorazepam 1 mg IM now 7.  Patient encouraged to follow-up with current behavioral health practice, given resources for [facility] if unable to get appointment for reevaluation of anxiety symptoms 8.  Strict return precautions given for worsening infection or nonhealing or nondraining abscess Final Clinical Impressions(s) / UC Diagnoses   Final diagnoses:  Abscess of left arm  GAD (generalized anxiety disorder)     Discharge Instructions      Take doxycyline twice a day for 7 days  Hold warm compresses to area at least 4 times a day to help drain  May follow-up if area continues to enlarge, becomes more painful or tender, is nondraining or nonhealing, do not attempt to burst area, allow it to open up and drain naturally  Continue taking zoloft as prescribed, continue taking Adderall as prescribed  If unable to get appointment with behavioral health physician, may follow-up [facility] for evaluation   ED Prescriptions     Medication Sig Dispense Auth. Provider    sertraline (ZOLOFT) 100 MG tablet  (Status: Discontinued) Take 1 tablet (100 mg total) by mouth daily. 30 tablet Terrah Decoster R, NP   sertraline (ZOLOFT) 100 MG tablet Take 1 tablet (100 mg total) by mouth daily. 30 tablet Verdine Grenfell R, NP   doxycycline (VIBRAMYCIN) 100 MG capsule Take 1 capsule (100 mg total) by mouth 2 (two) times daily. 14 capsule Sabryn Preslar R, NP   amphetamine-dextroamphetamine (ADDERALL XR) 10 MG 24 hr capsule Take 1 capsule (10 mg total) by mouth daily. 10 capsule Valinda Hoar, NP      I have reviewed the PDMP during this encounter.   Valinda Hoar, NP 11/12/20 1022

## 2021-01-14 ENCOUNTER — Other Ambulatory Visit: Payer: Self-pay

## 2021-01-14 ENCOUNTER — Ambulatory Visit (HOSPITAL_COMMUNITY)
Admission: EM | Admit: 2021-01-14 | Discharge: 2021-01-14 | Disposition: A | Discharge status: Home / Self Care | Payer: Medicaid Other | Attending: Urgent Care | Admitting: Urgent Care

## 2021-01-14 ENCOUNTER — Encounter (HOSPITAL_COMMUNITY): Payer: Self-pay | Admitting: Emergency Medicine

## 2021-01-14 DIAGNOSIS — Z760 Encounter for issue of repeat prescription: Secondary | ICD-10-CM | POA: Diagnosis not present

## 2021-01-14 DIAGNOSIS — F909 Attention-deficit hyperactivity disorder, unspecified type: Secondary | ICD-10-CM

## 2021-01-14 DIAGNOSIS — F419 Anxiety disorder, unspecified: Secondary | ICD-10-CM

## 2021-01-14 MED ORDER — TRIAMCINOLONE ACETONIDE 0.1 % EX CREA
1.0000 | TOPICAL_CREAM | Freq: Two times a day (BID) | CUTANEOUS | 0 refills | Status: DC
Start: 2021-01-14 — End: 2022-04-29

## 2021-01-14 MED ORDER — HYDROXYZINE HCL 25 MG PO TABS: 12.5000 mg | ORAL_TABLET | Freq: Three times a day (TID) | ORAL | 0 refills | Status: DC | PRN

## 2021-01-14 MED ORDER — GABAPENTIN 600 MG PO TABS: 600.0000 mg | ORAL_TABLET | Freq: Three times a day (TID) | ORAL | 0 refills | Status: DC

## 2021-01-14 NOTE — ED Provider Notes (Signed)
[facility]   MRN: [id number] DOB: [birth year]  Subjective:   Ricky Wu is a 35 y.o. male presenting for 1 week history of being out of gabapentin for his anxiety. Has not followed up with a mental health provider. Has been getting random medication refills from different providers.  He has a history of ADHD and depression as well.  [facility] but does not go there consistently.  Currently denies any thoughts of self-harm or SI, HI.  He does have his Zoloft.  He is out of Adderall and has not had it in several weeks.  Reports that when he gets anxious he ends up picking at his skin and feels like he continues to itch.  Symptoms are worse at night when he cannot sleep.  Has not had any particular drainage of pus or bleeding, erythema or warmth for the spots that he picks at.  No current facility-administered medications for this encounter.  Current Outpatient Medications:    sertraline (ZOLOFT) 100 MG tablet, Take 1 tablet (100 mg total) by mouth daily., Disp: 30 tablet, Rfl: 1   amLODipine (NORVASC) 2.5 MG tablet, Take 2.5 mg by mouth daily. (Patient not taking: Reported on 11/05/2020), Disp: , Rfl:    amphetamine-dextroamphetamine (ADDERALL XR) 10 MG 24 hr capsule, Take 1 capsule (10 mg total) by mouth daily. (Patient not taking: Reported on 01/14/2021), Disp: 10 capsule, Rfl: 0   aspirin 325 MG EC tablet, Take 325 mg by mouth daily. (Patient not taking: Reported on 01/14/2021), Disp: , Rfl:    doxycycline (VIBRAMYCIN) 100 MG capsule, Take 1 capsule (100 mg total) by mouth 2 (two) times daily. (Patient not taking: Reported on 01/14/2021), Disp: 14 capsule, Rfl: 0   gabapentin (NEURONTIN) 400 MG capsule, Take 400 mg by mouth 2 (two) times a day. RAN OUT OF ONE WEEK AGO, Disp: , Rfl:    ibuprofen (ADVIL) 800 MG tablet, Take 1 tablet (800 mg total) by mouth 3 (three) times daily., Disp: 21 tablet, Rfl: 0   oxyCODONE-acetaminophen  (PERCOCET) 5-325 MG tablet, 1-2 tabs PO q6 hours prn pain (Patient not taking: Reported on 11/05/2020), Disp: 30 tablet, Rfl: 0   pantoprazole (PROTONIX) 40 MG tablet, Take 1 tablet (40 mg total) by mouth daily. (Patient not taking: Reported on 01/14/2021), Disp: 30 tablet, Rfl: 0   No Known Allergies  Past Medical History:  Diagnosis Date   ADHD (attention deficit hyperactivity disorder)    Anhedonia    Anxiety    Anxiety disorder    Confusion    Depression    Disorganized thought process    GERD (gastroesophageal reflux disease)    Hypertension    Penis disorder    Sleep apnea    mild no cpap or bipap     Past Surgical History:  Procedure Laterality Date   FRACTURE SURGERY     HAND SURGERY     OPEN REDUCTION INTERNAL FIXATION (ORIF) METACARPAL Left 08/29/2019   Procedure: LEFT SMALL AND RING FINGER OPEN REDUCTION INTERNAL FIXATION (ORIF) METACARPAL;  Surgeon: Betha Loa, MD;  Location: [facility];  Service: Orthopedics;  Laterality: Left;   ORIF ANKLE FRACTURE Left 11/18/2015   Procedure: OPEN REDUCTION INTERNAL FIXATION (ORIF) LEFT ANKLE FRACTURE;  Surgeon: Nadara Mustard, MD;  [facility];  Service: Orthopedics;  Laterality: Left;    Family History  Problem Relation Age of Onset   Hypertension Mother    Diabetes  Mother    Cancer Father    Arthritis Father    Diabetes Other     Social History   Tobacco Use   Smoking status: Every Day    Packs/day: 1.50    Years: 14.00    Pack years: 21.00    Types: Cigarettes   Smokeless tobacco: Never  Vaping Use   Vaping Use: Never used  Substance Use Topics   Alcohol use: Not Currently    Comment: rarely   Drug use: Not Currently    Comment: has not used any drugs in one year    ROS   Objective:   Vitals: BP 132/90 (BP Location: Left Arm)   Pulse 81   Temp 98.4 F (36.9 C) (Oral)   Resp 20   SpO2 98%   Physical Exam Constitutional:      General: He is not in acute distress.    Appearance:  Normal appearance. He is well-developed and normal weight. He is not ill-appearing, toxic-appearing or diaphoretic.  HENT:     Head: Normocephalic and atraumatic.     Right Ear: External ear normal.     Left Ear: External ear normal.     Nose: Nose normal.     Mouth/Throat:     Pharynx: Oropharynx is clear.  Eyes:     General: No scleral icterus.       Right eye: No discharge.        Left eye: No discharge.     Extraocular Movements: Extraocular movements intact.     Pupils: Pupils are equal, round, and reactive to light.  Cardiovascular:     Rate and Rhythm: Normal rate.  Pulmonary:     Effort: Pulmonary effort is normal.  Musculoskeletal:     Cervical back: Normal range of motion.  Neurological:     Mental Status: He is alert and oriented to person, place, and time.  Psychiatric:        Mood and Affect: Mood normal.        Behavior: Behavior normal.        Thought Content: Thought content normal.        Judgment: Judgment normal.    Assessment and Plan :   PDMP not reviewed this encounter.  1. Anxiety   2. Medication refill   3. Attention deficit hyperactivity disorder (ADHD), unspecified ADHD type     Emphasized need for follow up with his psychiatrist for continued management of his mental health. Advised against Adderall refills.  I did refill his gabapentin and provided him with hydroxyzine for help with his sleep and itching.  As he had signs of irritant dermatitis on his hands and forearms provided with a prescription for triamcinolone cream.  No signs of skin infection. Counseled patient on potential for adverse effects with medications prescribed/recommended today, ER and return-to-clinic precautions discussed, patient verbalized understanding.    Wallis Bamberg, [place] 01/14/21 5885

## 2021-01-14 NOTE — ED Triage Notes (Signed)
Reports his main concern is needing gabapentin for anxiety disorder.  Patient has been out of medicine for 7 days.  [facility].  Having trouble finding office and does not have a phone.  Patient is recognizing he is fearful and not wanting to leave appt

## 2021-07-24 ENCOUNTER — Emergency Department (HOSPITAL_COMMUNITY)
Admission: EM | Admit: 2021-07-24 | Discharge: 2021-07-24 | Disposition: A | Discharge status: Home / Self Care | Payer: Medicaid Other | Attending: Emergency Medicine | Admitting: Emergency Medicine

## 2021-07-24 ENCOUNTER — Other Ambulatory Visit: Payer: Self-pay

## 2021-07-24 ENCOUNTER — Encounter (HOSPITAL_COMMUNITY): Payer: Self-pay

## 2021-07-24 ENCOUNTER — Emergency Department (HOSPITAL_COMMUNITY): Payer: Medicaid Other

## 2021-07-24 DIAGNOSIS — F1910 Other psychoactive substance abuse, uncomplicated: Secondary | ICD-10-CM | POA: Insufficient documentation

## 2021-07-24 DIAGNOSIS — D72829 Elevated white blood cell count, unspecified: Secondary | ICD-10-CM | POA: Insufficient documentation

## 2021-07-24 DIAGNOSIS — F419 Anxiety disorder, unspecified: Secondary | ICD-10-CM | POA: Insufficient documentation

## 2021-07-24 DIAGNOSIS — R300 Dysuria: Secondary | ICD-10-CM | POA: Diagnosis not present

## 2021-07-24 DIAGNOSIS — F410 Panic disorder [episodic paroxysmal anxiety] without agoraphobia: Secondary | ICD-10-CM | POA: Insufficient documentation

## 2021-07-24 DIAGNOSIS — Z79899 Other long term (current) drug therapy: Secondary | ICD-10-CM | POA: Insufficient documentation

## 2021-07-24 DIAGNOSIS — F32A Depression, unspecified: Secondary | ICD-10-CM | POA: Insufficient documentation

## 2021-07-24 DIAGNOSIS — F424 Excoriation (skin-picking) disorder: Secondary | ICD-10-CM | POA: Diagnosis not present

## 2021-07-24 DIAGNOSIS — R350 Frequency of micturition: Secondary | ICD-10-CM | POA: Diagnosis not present

## 2021-07-24 DIAGNOSIS — L03313 Cellulitis of chest wall: Secondary | ICD-10-CM | POA: Insufficient documentation

## 2021-07-24 DIAGNOSIS — T148XXA Other injury of unspecified body region, initial encounter: Secondary | ICD-10-CM

## 2021-07-24 DIAGNOSIS — R0789 Other chest pain: Secondary | ICD-10-CM

## 2021-07-24 LAB — CBC WITH DIFFERENTIAL/PLATELET
Abs Immature Granulocytes: 0.05 10*3/uL (ref 0.00–0.07)
Basophils Absolute: 0 10*3/uL (ref 0.0–0.1)
Basophils Relative: 0 %
Eosinophils Absolute: 0 10*3/uL (ref 0.0–0.5)
Eosinophils Relative: 0 %
HCT: 42.5 % (ref 39.0–52.0)
Hemoglobin: 14.9 g/dL (ref 13.0–17.0)
Immature Granulocytes: 1 %
Lymphocytes Relative: 19 %
Lymphs Abs: 2 10*3/uL (ref 0.7–4.0)
MCH: 31.7 pg (ref 26.0–34.0)
MCHC: 35.1 g/dL (ref 30.0–36.0)
MCV: 90.4 fL (ref 80.0–100.0)
Monocytes Absolute: 1 10*3/uL (ref 0.1–1.0)
Monocytes Relative: 9 %
Neutro Abs: 7.6 10*3/uL (ref 1.7–7.7)
Neutrophils Relative %: 71 %
Platelets: 283 10*3/uL (ref 150–400)
RBC: 4.7 MIL/uL (ref 4.22–5.81)
RDW: 13.3 % (ref 11.5–15.5)
WBC: 10.7 10*3/uL — ABNORMAL HIGH (ref 4.0–10.5)
nRBC: 0 % (ref 0.0–0.2)

## 2021-07-24 LAB — RAPID URINE DRUG SCREEN, HOSP PERFORMED
Amphetamines: POSITIVE — AB
Barbiturates: NOT DETECTED
Benzodiazepines: NOT DETECTED
Cocaine: POSITIVE — AB
Opiates: NOT DETECTED
Tetrahydrocannabinol: NOT DETECTED

## 2021-07-24 LAB — BASIC METABOLIC PANEL
Anion gap: 7 (ref 5–15)
BUN: 9 mg/dL (ref 6–20)
CO2: 23 mmol/L (ref 22–32)
Calcium: 9.2 mg/dL (ref 8.9–10.3)
Chloride: 106 mmol/L (ref 98–111)
Creatinine, Ser: 0.66 mg/dL (ref 0.61–1.24)
GFR, Estimated: >60 mL/min (ref >60)
Glucose, Bld: 101 mg/dL — ABNORMAL HIGH (ref 70–99)
Potassium: 3.7 mmol/L (ref 3.5–5.1)
Sodium: 136 mmol/L (ref 135–145)

## 2021-07-24 LAB — T4, FREE: Free T4: 0.79 ng/dL (ref 0.61–1.12)

## 2021-07-24 LAB — URINALYSIS, ROUTINE W REFLEX MICROSCOPIC
Bilirubin Urine: NEGATIVE
Glucose, UA: NEGATIVE mg/dL
Hgb urine dipstick: NEGATIVE
Ketones, ur: NEGATIVE mg/dL
Nitrite: NEGATIVE
Protein, ur: NEGATIVE mg/dL
Specific Gravity, Urine: 1.001 — ABNORMAL LOW (ref 1.005–1.030)
pH: 6 (ref 5.0–8.0)

## 2021-07-24 LAB — TROPONIN I (HIGH SENSITIVITY): Troponin I (High Sensitivity): 2 ng/L (ref <18)

## 2021-07-24 LAB — TSH: TSH: 0.753 u[IU]/mL (ref 0.350–4.500)

## 2021-07-24 MED ORDER — DOXYCYCLINE HYCLATE 100 MG PO CAPS: 100.0000 mg | ORAL_CAPSULE | Freq: Two times a day (BID) | ORAL | 0 refills | Status: AC

## 2021-07-24 MED ORDER — CEPHALEXIN 500 MG PO CAPS: 500.0000 mg | ORAL_CAPSULE | Freq: Four times a day (QID) | ORAL | 0 refills | Status: AC

## 2021-07-24 MED ORDER — FOSFOMYCIN TROMETHAMINE 3 G PO PACK: 3.0000 g | PACK | Freq: Once | ORAL | Status: DC

## 2021-07-24 MED ORDER — LORAZEPAM 1 MG PO TABS
1.0000 mg | ORAL_TABLET | Freq: Once | ORAL | Status: AC
  Administered 2021-07-24: 1 mg via ORAL
  Filled 2021-07-24: qty 1

## 2021-07-24 MED ORDER — CEFTRIAXONE SODIUM 1 G IJ SOLR
500.0000 mg | Freq: Once | INTRAMUSCULAR | Status: AC
  Administered 2021-07-24: 500 mg via INTRAMUSCULAR
  Filled 2021-07-24: qty 10

## 2021-07-24 MED ORDER — LIDOCAINE HCL (PF) 1 % IJ SOLN
2.0000 mL | Freq: Once | INTRAMUSCULAR | Status: AC
  Administered 2021-07-24: 1 mL
  Filled 2021-07-24: qty 30

## 2021-07-24 NOTE — Discharge Instructions (Addendum)
We will treat with Doxycycline and keflex which will treat chlamydia, UTI, and developing skin infection from your excoriations.  ? ?Schizophrenia/Mental Health Resources ?National Suicide Prevention Lifeline ?1-800-273-TALK (386)456-8260) ?OffParking.fi ??988?-Mental Health Crisis Line ? ?National Schizophrenia Foundation ?https://sczaction.org/ ?[employer] of Mental Health ?416-602-7656 ?nimhinfo@nih .gov (e-mail) ?http://www.maynard.net/ ?Schizophrenia & Psychosis Action Alliance ?(419)076-0815 ?info@sczaction .org ?https://sczaction.org/  ?5 Additional Healthline identified ?Best online Schizophrenia Support Groups? ?Students with Psychosis ? ?Schizophrenia Spectrum Support ? ?Supportiv ($30 monthly fee)  ? ?NAMI Connection Recovery Support Group ? ?Schizophrenia Alliance ? ?Local Resources: ?Outpatient:  ? ?[facility]:  ?[phone] ?[address]   [place], [place]  [address] ?  ? ? ?https://thomas-smith.info/ ? ? ?[place]  ?[facility] [place] ?[address] ?[place], [place] [address] ?[phone] ? ?[place]  ?[facility] at [facility] ?[address] ?[place], [place]  [address] ?[phone] ?Local Resources ?(Inpatient) ? ?[place] ?[facility] ?[address]  ?[place], [place] [address] ?[phone] ? ?[facility] ?Behavioral Medicine Unit and Geriatric Psychiatric Unit   ?[address]  ?[place], [place] [address] ?[phone] ? ? ?NAMI [facility] ?http://chen.com/ ? ?NAMI [facility] ?https://namiguilford.org/ ? ?[employer] and Support Resources: ? ?Partners Ending Homelessness ?DesireBlog.pl ? ?Interactive Resource Center ?https://www.interactiveresourcecenter.org/ ? ?[employer] ?https://www.greensborourbanministry.org/ ? ?[facility] [employer] (adult [employer] shelter) ?www.BusySkies.hu ?[address] ?[place], [place] [address] ?[phone] ?[facility] [employer] [facility] [employer] and [facility] [employer] [facility] ?[address] [place], [place] [address] ?[phone] ? ? ?[employer] ?214-418-2870 VET (([phone]) ? ?[employer] ?([phone] press 1 ?Confidential chat-VeteransCrisisLine.net ?Or Text to [phone] ? ?[address] ?Call 211 or (332)[phone] ?www.http://stephens-thompson.biz/ ? ?Affordable Housing Resources in [place] ?nchousingsearch.com   ?6676219256 ? ?Shelters ? ?[employer] Housing Hotline ?[phone] ?8:30am-5:30pm ?[facility]et [facility] ?[address] ?[place], [place]  [address] ?[phone] ?Male veterans 18+ with substance abuse issues ?Eligibility:  By Referral Only ? ?[facility] [employer] [facility] ?[address] ?[place], [place] [address] ?[phone] Ext. 347 ?Adult Men & Women ?Eligibility: Valid ID & Social Security Card ?www.greensborourbanministry.org ? ?[facility] ?[address] ?[place], [place]  [address] ?[phone] ?Male veterans 18+ with substance abuse issues ?Eligibility:  By Referral Only ? ?[facility] [employer] ?[address] ?[place], [place]  [address] ?[phone] ?Single women 18+ without dependents ?Open 6pm-8am ?Eligibility:  Valid ID & Social Security Card ?Call to check availability  ?http://westendministries.org/leslieshouse.aspx ? ?[facility] ?[address] ?[place], [place]  [address] ?[phone] ?Male veterans 18+ with substance abuse/mental ?health issues ?Eligibility:  By Referral Only ? ?[facility] ?[address] ?[place], [place] [address] ?[phone] ?Call to check availability ?Males 18+ ?Eligibility: Valid ID & Social Security  Card ?www.odm-hp.org ? ?[facility]iday representative of [employer] ?[address] ?[place], [place] [address] ?[phone] ?Women 18+ & Families with children ?Eligibility:  Valid ID & Criminal Background Check ?https://gomez.info/ ? ? ?[facility] [place] (Care Management): [phone] ?[facility]: Behavioral Health 24 Hour Phone Line ([phone] ?NAMI Hotline (810)235-0163 ?NAMI [place] [phone] ? ?2-1-1 Referral Service ?United Way 211 ?Call 211 or 2040600720 ?www.http://stephens-thompson.biz/ ?A free [employer], 24/7 telephone information and referral service to help link citizens who are seeking help with the community resources they need. In [place], [place], [place] and [place] counties,  just dial 211 from your phone. ?Mental Health Association in [place] ([phone] ?Support groups for anxiety, depression and bipolar disorder, schizophrenia, family and friends, aftermath of suicide, and mental wellness for Latinos (in [place]). ?Mental Health Association in [facility] [phone] ?Offers support groups, [facility] offers. Psychological, vocational, educational and other rehabilitation services to those who suffer from mental health illness of the 49 and up (5 days a week/5hours a day), offer out patient services like diagnostic evaluation, comprehensive clinical assessments, individual counseling, group therapy, psycho-educational workshops, referral to other specialists, referral to a psychiatrist for an evaluation for medication, consultation, and outreach/training. ?ADS (Alcohol and Drug Services) ?(336) M3564926 310-329-5421 ?Substance Abuse education, prevention and treatment (detox, assessments, intensive outpatient and inpatient counseling and programs). ?[facility] ?[place] ([phone]) 820-232-4707, [place] (336) C8132924 ?Support groups for posttraumatic stress, depression, and schizophrenia? as well as day programs for individuals with severe mental illness. ?[facility] [place] [phone] ?[facility]-[phone] ?[employer] [phone] or www.kellinfoundation.org ?Telehealth platform, Individual counseling across the lifespan for both mental health and substance use, support groups, advocacy, case management, virtual villages, resource coordination. ?[facility]([phone] ?[facility]-(506) [phone] or 9311634348 ?([phone]. Provides mental health services to all residents regardless of ability to pay. [address], [place]. [place]. 24 ?Domestic Violence Crisis Line ?Family Service of the [place] ?Call for shelter and/or safety planning ?[employer] ?[phone] (24/7) ?[facility] ?9172908298 (24/7) ? ?VA Homeless Hotline ?(680) [phone] ? ?[employer] ?([phone] 430-7609 press 1 ?Confidential chat-VeteransCrisisLine.net ?Or Text to [phone] ? ?[facility] ?[address] ?[place], [place] [address] ?[phone] ?Hours: Mon-Fri. 8am-5pm ?[facility] Mobile Crisis Management ?Mobile crisis response for mental health, substance abuse or intellectual/developmental disabilities ?[phone] ? ? ? ? ?Disclaimer: This resource list is subject to change at any time and is a starting point for resource identification as of 03/26/2021.   ?

## 2021-07-24 NOTE — ED Triage Notes (Addendum)
BIB EMS from home for depression, had a panic attack yesterday and wanted to come in and get some help. States he is compliant with his medications but thinks they are no longer working, used oral meth around 0200 ?

## 2021-07-24 NOTE — ED Provider Notes (Signed)
?[facility]-EMERGENCY DEPT ?Provider Note ? ? ?CSN: [id number] ?Arrival date & time: 07/24/21  6203 ? ?  ? ?History ? ?Chief Complaint  ?Patient presents with  ? Depression  ? ? ?Ricky Wu is a 36 y.o. male. ? ? ?Depression ? ? ? 36 year old male with polysubstance use presenting with anxiety in the setting of drug use. Drank alcohol, also used methamphetamine and crack cocaine. Was feeling paranoid in this context. Felt as if he had a panic attack. Is supposed to be on Zoloft for depression. Denies SI, HI, AVH. Is currently residing with his mother. States that he got in an argument with his mother yesterday. He is concerned for her well being because he thinks he locked her out of the apartment. [facility]. Hasn't seen a provider in over a year. Has access to his medications at home.  ? ?He has excoriations along his chest and upper extremities and is worried about developing skin infection. He states that redness around the area improved yesterday after taking a shower.  Endorses increased urinary frequency, endorses some dysuria at the end of his urinary stream. He was last sexually active one week ago.  No fevers, chills, flank or abdominal pain. ? ?Home Medications ?Prior to Admission medications   ?Medication Sig Start Date End Date Taking? Authorizing Provider  ?cephALEXin (KEFLEX) 500 MG capsule Take 1 capsule (500 mg total) by mouth 4 (four) times daily for 5 days. 07/24/21 07/29/21 Yes Ernie Avena, MD  ?doxycycline (VIBRAMYCIN) 100 MG capsule Take 1 capsule (100 mg total) by mouth 2 (two) times daily for 7 days. 07/24/21 07/31/21 Yes Ernie Avena, MD  ?gabapentin (NEURONTIN) 600 MG tablet Take 1 tablet (600 mg total) by mouth 3 (three) times daily. 01/14/21   Wallis Bamberg, PA-C  ?hydrOXYzine (ATARAX/VISTARIL) 25 MG tablet Take 0.5-1 tablets (12.5-25 mg total) by mouth every 8 (eight) hours as needed for itching. 01/14/21   Wallis Bamberg, PA-C  ?ibuprofen  (ADVIL) 800 MG tablet Take 1 tablet (800 mg total) by mouth 3 (three) times daily. 08/15/19   Mare Ferrari, PA-C  ?sertraline (ZOLOFT) 100 MG tablet Take 1 tablet (100 mg total) by mouth daily. 11/05/20   Valinda Hoar, NP  ?triamcinolone cream (KENALOG) 0.1 % Apply 1 application topically 2 (two) times daily. 01/14/21   Wallis Bamberg, PA-C  ?   ? ?Allergies    ?Patient has no known allergies.   ? ?Review of Systems   ?Review of Systems  ?Psychiatric/Behavioral:  Positive for depression.   ?All other systems reviewed and are negative. ? ?Physical Exam ?Updated Vital Signs ?BP (!) 142/93   Pulse 90   Temp 98.4 ?F (36.9 ?C) (Oral)   Resp 14   Ht 6\' 1"  (1.854 m)   Wt 99.8 kg   SpO2 99%   BMI 29.03 kg/m?  ?Physical Exam ?Vitals and nursing note reviewed.  ?Constitutional:   ?   General: He is not in acute distress. ?HENT:  ?   Head: Normocephalic and atraumatic.  ?Eyes:  ?   Conjunctiva/sclera: Conjunctivae normal.  ?   Pupils: Pupils are equal, round, and reactive to light.  ?Cardiovascular:  ?   Rate and Rhythm: Normal rate and regular rhythm.  ?Pulmonary:  ?   Effort: Pulmonary effort is normal. No respiratory distress.  ?Abdominal:  ?   General: There is no distension.  ?   Tenderness: There is no guarding.  ?Musculoskeletal:     ?  General: No deformity or signs of injury.  ?   Cervical back: Neck supple.  ?Skin: ?   Findings: Lesion present. No rash.  ?   Comments: Covered in excoriations  ?Neurological:  ?   General: No focal deficit present.  ?   Mental Status: He is alert. Mental status is at baseline.  ?Psychiatric:     ?   Attention and Perception: Attention and perception normal.     ?   Mood and Affect: Affect normal. Mood is anxious.     ?   Speech: Speech normal.     ?   Behavior: Behavior normal. Behavior is cooperative.     ?   Thought Content: Thought content is paranoid. Thought content is not delusional. Thought content does not include homicidal or suicidal ideation.  ? ? ?ED Results /  Procedures / Treatments   ?Labs ?(all labs ordered are listed, but only abnormal results are displayed) ?Labs Reviewed  ?CBC WITH DIFFERENTIAL/PLATELET - Abnormal; Notable for the following components:  ?    Result Value  ? WBC 10.7 (*)   ? All other components within normal limits  ?BASIC METABOLIC PANEL - Abnormal; Notable for the following components:  ? Glucose, Bld 101 (*)   ? All other components within normal limits  ?RAPID URINE DRUG SCREEN, HOSP PERFORMED - Abnormal; Notable for the following components:  ? Cocaine POSITIVE (*)   ? Amphetamines POSITIVE (*)   ? All other components within normal limits  ?URINALYSIS, ROUTINE W REFLEX MICROSCOPIC - Abnormal; Notable for the following components:  ? Color, Urine COLORLESS (*)   ? Specific Gravity, Urine 1.001 (*)   ? Leukocytes,Ua SMALL (*)   ? Bacteria, UA RARE (*)   ? All other components within normal limits  ?TSH  ?T4, FREE  ?GC/CHLAMYDIA PROBE AMP ([place]) NOT AT [facility]  ?TROPONIN I (HIGH SENSITIVITY)  ? ? ?EKG ?EKG Interpretation ? ?Date/Time:  Saturday July 24 2021 08:56:39 EDT ?Ventricular Rate:  92 ?PR Interval:  159 ?QRS Duration: 87 ?QT Interval:  360 ?QTC Calculation: 446 ?R Axis:   10 ?Text Interpretation: Sinus rhythm No significant change since last tracing Confirmed by Ernie AvenaLawsing, Tifany Hirsch ([id number]) on 07/24/2021 9:10:46 AM ? ?Radiology ?DG Chest Portable 1 View ? ?Result Date: 07/24/2021 ?CLINICAL DATA:  Chest tightness. EXAM: PORTABLE CHEST 1 VIEW COMPARISON:  08/12/2019 FINDINGS: 0915 hours. The lungs are clear without focal pneumonia, edema, pneumothorax or pleural effusion. The cardiopericardial silhouette is within normal limits for size. The visualized bony structures of the thorax are unremarkable. Telemetry leads overlie the chest. IMPRESSION: No active disease. Electronically Signed   By: Kennith CenterEric  Mansell M.D.   On: 07/24/2021 10:14   ? ?Procedures ?Procedures  ? ? ?Medications Ordered in ED ?Medications  ?cefTRIAXone (ROCEPHIN) injection 500 mg  (has no administration in time range)  ?LORazepam (ATIVAN) tablet 1 mg (1 mg Oral Given 07/24/21 0859)  ? ? ?ED Course/ Medical Decision Making/ A&P ?Clinical Course as of 07/24/21 1030  ?Sat Jul 24, 2021  ?1003 COCAINE(!): POSITIVE [JL]  ?1003 Amphetamines(!): POSITIVE [JL]  ?1003 Troponin I (High Sensitivity): 2 [JL]  ?  ?Clinical Course User Index ?[JL] Ernie AvenaLawsing, Jillisa Harris, MD  ? ?                        ?Medical Decision Making ?Amount and/or Complexity of Data Reviewed ?Labs: ordered. ?Radiology: ordered. ? ?Risk ?Prescription drug management. ? ? ?36 year old male with  polysubstance use presenting with anxiety in the setting of drug use. Drank alcohol, also used methamphetamine and crack cocaine. Was feeling paranoid in this context. Felt as if he had a panic attack. Is supposed to be on Zoloft for depression. Denies SI, HI, AVH. Is currently residing with his mother. States that he got in an argument with his mother yesterday. He is concerned for her well being because he thinks he locked her out of the apartment. [facility]. Hasn't seen a provider in over a year. Has access to his medications at home.  ? ?He has excoriations along his chest and upper extremities and is worried about developing skin infection. He states that redness around the area improved yesterday after taking a shower.  Endorses increased urinary frequency, endorses some dysuria at the end of his urinary stream. He was last sexually active one week ago.  No fevers, chills, flank or abdominal pain. ? ?On arrival, the patient was tachycardic, P 103 in the setting of admitted substance use. Denies SI, HI, AVH. Endorses paranoia in the setting of methamphetamine and crack cocaine use. Endorses a slight sensation of chest tightness in this setting.  ? ?The patient's EKG is nonischemic, NSR, rate 92, no significant ST segment changes.  A chest x-ray was performed and was reviewed by myself and radiology revealed no active  disease.  Screening laboratory work-up significant for CBC with a mild leukocytosis to 10.7. The patient's TSH was normal, troponin normal, BMP unremarkable. UDS positive for cocaine and amphetamines, urinalysi

## 2021-07-27 LAB — GC/CHLAMYDIA PROBE AMP (~~LOC~~) NOT AT ARMC
Chlamydia: NEGATIVE
Comment: NEGATIVE
Comment: NORMAL
Neisseria Gonorrhea: NEGATIVE

## 2021-10-21 ENCOUNTER — Ambulatory Visit: Payer: Medicaid Other | Admitting: Physician Assistant

## 2021-10-21 ENCOUNTER — Encounter: Payer: Self-pay | Admitting: Physician Assistant

## 2021-10-21 VITALS — BP 127/97 | HR 88 | Ht 74.0 in | Wt 220.0 lb

## 2021-10-21 DIAGNOSIS — F333 Major depressive disorder, recurrent, severe with psychotic symptoms: Secondary | ICD-10-CM

## 2021-10-21 DIAGNOSIS — F17210 Nicotine dependence, cigarettes, uncomplicated: Secondary | ICD-10-CM | POA: Diagnosis not present

## 2021-10-21 MED ORDER — SERTRALINE HCL 100 MG PO TABS: 100.0000 mg | ORAL_TABLET | Freq: Every day | ORAL | 1 refills | Status: DC

## 2021-10-21 NOTE — Progress Notes (Signed)
New Patient Office Visit  Subjective    Patient ID: Ricky Wu, male    DOB: 1985/04/04  Age: 36 y.o. MRN: [id number]  CC:  Chief Complaint  Patient presents with   Depression    HPI Ricky Wu requested refill of his Zoloft.  States that he has been out of it for the past 2 to 3 weeks, states that he has been having increased depressed moods and thoughts that he would be better off dead.  States that he has been dealing with depression for most of his life, states that the Zoloft does help "the dark thoughts".  States that he had been seeing psychiatry, unfortunately his provider moved to the [place] area and he is unable to travel there to be seen.  States that he is prescribed 100 mg once daily, but will take 50 mg several days a week due to adverse effect of sexual dysfunction at 100 mg.  Adamantly denies any plans of self-harm.  States he has been taking his gabapentin as directed.     10/21/2021   10:39 AM  Depression screen PHQ 2/9  Decreased Interest 3  Down, Depressed, Hopeless 3  PHQ - 2 Score 6  Altered sleeping 3  Tired, decreased energy 3  Change in appetite 0  Feeling bad or failure about yourself  3  Trouble concentrating 3  Moving slowly or fidgety/restless 3  Suicidal thoughts 3  PHQ-9 Score 24  Difficult doing work/chores Extremely dIfficult      10/21/2021   10:40 AM  GAD 7 : Generalized Anxiety Score  Nervous, Anxious, on Edge 3  Control/stop worrying 3  Worry too much - different things 3  Trouble relaxing 3  Restless 3  Easily annoyed or irritable 3  Afraid - awful might happen 3  Total GAD 7 Score 21  Anxiety Difficulty Extremely difficult        Outpatient Encounter Medications as of 10/21/2021  Medication Sig   gabapentin (NEURONTIN) 600 MG tablet Take 1 tablet (600 mg total) by mouth 3 (three) times daily.   hydrOXYzine (ATARAX/VISTARIL) 25 MG tablet Take 0.5-1 tablets (12.5-25 mg total) by mouth every 8 (eight) hours  as needed for itching.   ibuprofen (ADVIL) 800 MG tablet Take 1 tablet (800 mg total) by mouth 3 (three) times daily.   sertraline (ZOLOFT) 100 MG tablet Take 1 tablet (100 mg total) by mouth daily.   triamcinolone cream (KENALOG) 0.1 % Apply 1 application topically 2 (two) times daily.   [DISCONTINUED] sertraline (ZOLOFT) 100 MG tablet Take 1 tablet (100 mg total) by mouth daily.   No facility-administered encounter medications on file as of 10/21/2021.    Past Medical History:  Diagnosis Date   ADHD (attention deficit hyperactivity disorder)    Anhedonia    Anxiety    Anxiety disorder    Confusion    Depression    Disorganized thought process    GERD (gastroesophageal reflux disease)    Hypertension    Penis disorder    Sleep apnea    mild no cpap or bipap    Past Surgical History:  Procedure Laterality Date   FRACTURE SURGERY     HAND SURGERY     OPEN REDUCTION INTERNAL FIXATION (ORIF) METACARPAL Left 08/29/2019   Procedure: LEFT SMALL AND RING FINGER OPEN REDUCTION INTERNAL FIXATION (ORIF) METACARPAL;  Surgeon: Betha Loa, MD;  Location: [facility];  Service: Orthopedics;  Laterality: Left;   ORIF ANKLE FRACTURE Left  11/18/2015   Procedure: OPEN REDUCTION INTERNAL FIXATION (ORIF) LEFT ANKLE FRACTURE;  Surgeon: Nadara Mustard, MD;  Location: [facility] OR;  Service: Orthopedics;  Laterality: Left;    Family History  Problem Relation Age of Onset   Hypertension Mother    Diabetes Mother    Cancer Father    Arthritis Father    Diabetes Other     Social History   Socioeconomic History   Marital status: Single    Spouse name: Not on file   Number of children: 0   Years of education: 85   Highest education level: Not on file  Occupational History    Comment: Does Not Work  Tobacco Use   Smoking status: Every Day    Packs/day: 1.50    Years: 14.00    Total pack years: 21.00    Types: Cigarettes   Smokeless tobacco: Never  Vaping Use   Vaping Use: Never  used  Substance and Sexual Activity   Alcohol use: Not Currently    Comment: rarely   Drug use: Not Currently    Comment: has not used any drugs in one year   Sexual activity: Not on file  Other Topics Concern   Not on file  Social History Narrative   Patient lives at home home alone sharing with his aunt and uncle. Patient has some college. Patient  is not working at this time. education.   Left handed.   Caffeine- two cokes.   Social Determinants of Health   Financial Resource Strain: Not on file  Food Insecurity: Not on file  Transportation Needs: Not on file  Physical Activity: Not on file  Stress: Not on file  Social Connections: Not on file  Intimate Partner Violence: Not on file    Review of Systems  Constitutional: Negative.   HENT: Negative.    Eyes: Negative.   Respiratory:  Negative for shortness of breath.   Cardiovascular:  Negative for chest pain.  Gastrointestinal: Negative.   Genitourinary: Negative.   Musculoskeletal: Negative.   Skin: Negative.   Neurological: Negative.   Endo/Heme/Allergies: Negative.   Psychiatric/Behavioral:  Positive for depression and suicidal ideas. The patient is nervous/anxious.         Objective    BP (!) 127/97 (BP Location: Left Arm)   Pulse 88   Ht 6\' 2"  (1.88 m)   Wt 220 lb (99.8 kg)   SpO2 100%   BMI 28.25 kg/m   Physical Exam Vitals and nursing note reviewed.  Constitutional:      Appearance: Normal appearance.  HENT:     Head: Normocephalic and atraumatic.     Right Ear: External ear normal.     Left Ear: External ear normal.     Nose: Nose normal.     Mouth/Throat:     Mouth: Mucous membranes are moist.     Pharynx: Oropharynx is clear.  Eyes:     Extraocular Movements: Extraocular movements intact.     Conjunctiva/sclera: Conjunctivae normal.     Pupils: Pupils are equal, round, and reactive to light.  Cardiovascular:     Rate and Rhythm: Normal rate and regular rhythm.     Pulses: Normal  pulses.     Heart sounds: Normal heart sounds.  Pulmonary:     Effort: Pulmonary effort is normal.     Breath sounds: Normal breath sounds.  Musculoskeletal:        General: Normal range of motion.     Cervical back: Normal range  of motion and neck supple.  Skin:    General: Skin is warm and dry.  Neurological:     General: No focal deficit present.     Mental Status: He is alert and oriented to person, place, and time.  Psychiatric:        Attention and Perception: Attention normal.        Mood and Affect: Mood is anxious.        Speech: Speech normal.        Behavior: Behavior normal.        Thought Content: Thought content does not include homicidal or suicidal plan.        Cognition and Memory: Cognition normal.        Judgment: Judgment normal.        Assessment & Plan:   Problem List Items Addressed This Visit   None Visit Diagnoses     Severe episode of recurrent major depressive disorder, with psychotic features (HCC)    -  Primary   Relevant Medications   sertraline (ZOLOFT) 100 MG tablet       1. Severe episode of recurrent major depressive disorder, with psychotic features (HCC) Resume current regimen.  Patient education given on coping skills.  Patient given appointment to establish care [facility] at [facility].  Red flags given for prompt reevaluation.  - sertraline (ZOLOFT) 100 MG tablet; Take 1 tablet (100 mg total) by mouth daily.  Dispense: 30 tablet; Refill: 1 - Ambulatory referral to Psychiatry    I have reviewed the patient's medical history (PMH, PSH, Social History, Family History, Medications, and allergies) , and have been updated if relevant. I spent 30 minutes reviewing chart and  face to face time with patient.   Return in about 6 days (around 10/27/2021) for To establish PCP, with Ricky Stabs, NP at [facility] at [facility].   Kasandra Knudsen Mayers, PA-C

## 2021-10-21 NOTE — Patient Instructions (Addendum)
I sent a refill of your medication to your pharmacy.  I will start a referral for you to be seen by behavioral health, they will be contacting you shortly, if you do not hear from them in the next week or so, please let us know.  Roney Jaffe, PA-C Physician Assistant [facility] [email or web address]   Suicidal Feelings: How to Help Yourself Suicide is when you end your own life. Suicidal ideation includes expressing thoughts about, or a preoccupation with, ending your own life. There are many things you can do to help yourself feel better when struggling with these feelings. Many services and people are available to support you and others who struggle with similar feelings. If you ever feel like you may hurt yourself or others, or have thoughts about taking your own life, get help right away. To get help: Go to your nearest emergency department. Call your local emergency services (911 in the [place] Call the [employer] health and human services helpline (211 in the [place] Call or text a suicide hotline to speak with a trained counselor. The following suicide hotlines are available in the [place]: 1-800-273-TALK (1-(716)078-9040 or 988 in the [place] 1-800-SUICIDE 586-006-8067). Text 302-016-8021. This is the [employer] in the [place] 949-308-7276. This is a hotline for Spanish speakers. 2241443712. This is a hotline for TTY users. 1-866-4-U-TREVOR 986 276 8690). This is a hotline for lesbian, gay, bisexual, transgender, or questioning youth. For a list of hotlines in [place], visit AmenCredit.is.html Contact a crisis center or a local suicide prevention center. To find a crisis center or suicide prevention center: Call your local hospital, clinic, community service organization, mental health center, social service provider, or health department. Ask for help with connecting to  a crisis center. For a list of crisis centers in the [place], visit: suicidepreventionlifeline.org For a list of crisis centers in [place], visit: suicideprevention.ca How to help yourself feel better  Promise yourself that you will not do anything bad or extreme when you have suicidal feelings. Remember the times you have felt hopeful. Many people have gotten through suicidal thoughts and feelings, and you can too. If you have had these feelings before, remind yourself that you can get through them again. Let family, friends, teachers, or counselors know how you are feeling. Do not separate yourself from those who care about you and want to help you. Talk with someone every day, even if you do not feel like talking to anyone or being with other people. Face-to-face conversation is best to help them understand your feelings. Contact a mental health care provider and work with this person regularly. Make a safety plan that you can follow during a crisis. Include phone numbers of suicide prevention hotlines, mental health professionals, and trusted friends and family members you can call during an emergency. Save these numbers on your phone. If you are thinking of taking a lot of medicine, give your medicine to someone who can give it to you as prescribed. If you are on antidepressants and are concerned you will overdose, tell your health care provider so that he or she can give you safer medicines. Try to stick to your routines and follow a schedule every day. Make self-care a priority. Make a list of realistic goals, and cross them off when you achieve them. Accomplishments can give you a sense of worth. Wait until you are feeling better before doing things that you find difficult or unpleasant. Do things that you have always enjoyed  to take your mind off your feelings. Try reading a book, or listening to or playing music. Spending time outside, in nature, may help you feel better. Follow  these instructions at home:  Visit your primary health care provider every year for a physical and a mental health checkup. Take over-the-counter and prescription medicines only as told by your health care provider. Ask your health care provider about the possible side effects of any medicines you are taking. Ask your health care provider about whether suicidal ideation is a possible side effect of any of your medicines. Learn about suicidal ideation and what increases the risk for the development of suicidal thoughts. Eat a well-balanced diet, and eat regular meals. Get plenty of rest. Exercise if you are able. Just 30 minutes of exercise each day can help you feel better. Keep your living space well lit. Do not use alcohol or drugs. Remove these substances from your home. General recommendations Remove weapons, poisons, knives, and other deadly items from your home. Work with a mental health care provider as needed. When you are feeling well, write yourself a letter with tips and support that you can read when you are not feeling well. Remember that life's difficulties can be sorted out with help. Conditions can be treated, and you can learn behaviors and ways of thinking that will help you. Work with your health care provider or counselor to learn ways of coping with your thoughts and feelings. Where to find more information National Suicide Prevention Lifeline: www.suicidepreventionlifeline.org Hopeline: www.hopeline.com [employer] for Suicide Prevention: https://www.ayers.com/ [employer] (for lesbian, gay, bisexual, transgender, or questioning youth): www.thetrevorproject.[employer] of Mental Health: https://ramirez-williams.com/ Suicide Prevention Resources: FarmerBuys.com.au Contact a health care provider if: You feel as though you are a burden to others. You feel agitated, angry, vengeful, or have extreme mood swings. You  have withdrawn from family and friends. You are frequently using drugs or alcohol. Get help right away if: You are talking about suicide or wishing to die. You start making plans for how to commit suicide. You feel that you have no reason to live. You start making plans for putting your affairs in order, saying goodbye, or giving your possessions away. You feel guilt, shame, or unbearable pain, and it seems like there is no way out. You are engaging in risky behaviors that could lead to death. If you have any of these thoughts or symptoms, get help right away: Go to your nearest emergency department or crisis center. Call emergency services (911 in the [place] Call or text a suicide crisis helpline. Summary Suicide is when you take your own life. Suicidal feelings are thoughts about ending your own life. Promise yourself that you will not do anything bad or extreme when you have suicidal feelings. Let family, friends, teachers, or counselors know how you are feeling. Get help right away if you start making plans for how to commit suicide. This information is not intended to replace advice given to you by your health care provider. Make sure you discuss any questions you have with your health care provider. Document Revised: 10/08/2020 Document Reviewed: 07/23/2020 Elsevier Patient Education  2023 [employer].

## 2021-10-22 NOTE — Progress Notes (Unsigned)
  Subjective:    Ricky Wu - 36 y.o. male MRN [id number]  Date of birth: Aug 25, 1985  HPI  Ricky Wu is to establish care.  Current issues and/or concerns: 10/21/2021 [facility] with [facility] [place] Severe episode of recurrent major depressive disorder, with psychotic features (HCC) Resume current regimen.  Patient education given on coping skills.  Patient given appointment to establish care Primary Care at [facility].  Red flags given for prompt reevaluation.   - sertraline (ZOLOFT) 100 MG tablet; Take 1 tablet (100 mg total) by mouth daily.  Dispense: 30 tablet; Refill: 1 - Ambulatory referral to Psychiatry      ROS per HPI     Health Maintenance:  Health Maintenance Due  Topic Date Due   COVID-19 Vaccine (1) Never done   Hepatitis C Screening  Never done   TETANUS/TDAP  Never done     Past Medical History: Patient Active Problem List   Diagnosis Date Noted   Alcohol abuse 08/26/2016   Bimalleolar ankle fracture 11/18/2015   Memory loss 03/14/2013   Confusion    GAD (generalized anxiety disorder) 09/12/2012      Social History   reports that he has been smoking cigarettes. He has a 21.00 pack-year smoking history. He has never used smokeless tobacco. He reports that he does not currently use alcohol. He reports that he does not currently use drugs.   Family History  family history includes Arthritis in his father; Cancer in his father; Diabetes in his mother and another family member; Hypertension in his mother.   Medications: reviewed and updated   Objective:   Physical Exam There were no vitals taken for this visit. Physical Exam      Assessment & Plan:         Patient was given clear instructions to go to Emergency Department or return to medical center if symptoms don't improve, worsen, or new problems develop.The patient verbalized understanding.  I discussed the assessment and treatment plan with the patient. The patient was  provided an opportunity to ask questions and all were answered. The patient agreed with the plan and demonstrated an understanding of the instructions.   The patient was advised to call back or seek an in-person evaluation if the symptoms worsen or if the condition fails to improve as anticipated.    Ricky Stabs, NP 10/22/2021, 7:39 AM Primary Care at [facility]

## 2021-10-27 ENCOUNTER — Encounter: Payer: Medicaid Other | Admitting: Family

## 2021-10-27 DIAGNOSIS — Z7689 Persons encountering health services in other specified circumstances: Secondary | ICD-10-CM

## 2021-10-28 ENCOUNTER — Encounter: Payer: Self-pay | Admitting: Family

## 2021-11-23 ENCOUNTER — Other Ambulatory Visit: Payer: Self-pay | Admitting: Physician Assistant

## 2021-11-23 NOTE — Telephone Encounter (Signed)
Pt requesting refill of Gabapentin. Last ordered 07/28/20 #90. Was ordered by Maurene Capes PA-C [facility].  Routing back to ordering provider.    Requested Prescriptions  Pending Prescriptions Disp Refills   gabapentin (NEURONTIN) 400 MG capsule [Pharmacy Med Name: gabapentin 400 mg capsule] 90 capsule 0    Sig: TAKE 1 CAPSULE BY MOUTH 3 TIMES DAILY     There is no refill protocol information for this order

## 2021-12-03 NOTE — Telephone Encounter (Signed)
Attempted to call pt with both home and mobile numbers- unable to LM. Will refuse request for med. Pt needs to go to [facility] bus. Requested Prescriptions  Pending Prescriptions Disp Refills   gabapentin (NEURONTIN) 400 MG capsule [Pharmacy Med Name: gabapentin 400 mg capsule] 90 capsule 0    Sig: TAKE 1 CAPSULE BY MOUTH 3 TIMES DAILY     There is no refill protocol information for this order

## 2021-12-22 ENCOUNTER — Emergency Department (HOSPITAL_COMMUNITY)
Admission: EM | Admit: 2021-12-22 | Discharge: 2021-12-22 | Disposition: A | Discharge status: Home / Self Care | Payer: Medicaid Other | Attending: Emergency Medicine | Admitting: Emergency Medicine

## 2021-12-22 DIAGNOSIS — F1010 Alcohol abuse, uncomplicated: Secondary | ICD-10-CM | POA: Diagnosis not present

## 2021-12-22 DIAGNOSIS — Z5321 Procedure and treatment not carried out due to patient leaving prior to being seen by health care provider: Secondary | ICD-10-CM | POA: Diagnosis not present

## 2021-12-22 NOTE — ED Triage Notes (Signed)
The [facility] because [facility] called [facility]  the pt is a heavy alcohol user and meth user  he is homeless

## 2021-12-22 NOTE — ED Notes (Signed)
Pt called multiple times for triage, no answer. 

## 2022-01-25 ENCOUNTER — Encounter (HOSPITAL_COMMUNITY): Payer: Self-pay | Admitting: Student

## 2022-01-25 ENCOUNTER — Other Ambulatory Visit: Payer: Self-pay

## 2022-01-25 ENCOUNTER — Emergency Department (HOSPITAL_COMMUNITY): Payer: Medicaid Other

## 2022-01-25 ENCOUNTER — Emergency Department (HOSPITAL_COMMUNITY)
Admission: EM | Admit: 2022-01-25 | Discharge: 2022-01-26 | Discharge status: Against Medical Advice | Payer: Medicaid Other | Attending: Emergency Medicine | Admitting: Emergency Medicine

## 2022-01-25 DIAGNOSIS — W268XXA Contact with other sharp object(s), not elsewhere classified, initial encounter: Secondary | ICD-10-CM | POA: Insufficient documentation

## 2022-01-25 DIAGNOSIS — M659 Synovitis and tenosynovitis, unspecified: Secondary | ICD-10-CM | POA: Diagnosis not present

## 2022-01-25 DIAGNOSIS — I10 Essential (primary) hypertension: Secondary | ICD-10-CM | POA: Insufficient documentation

## 2022-01-25 DIAGNOSIS — D72829 Elevated white blood cell count, unspecified: Secondary | ICD-10-CM | POA: Insufficient documentation

## 2022-01-25 DIAGNOSIS — M79645 Pain in left finger(s): Secondary | ICD-10-CM | POA: Diagnosis present

## 2022-01-25 LAB — CBC WITH DIFFERENTIAL/PLATELET
Abs Immature Granulocytes: 0.07 10*3/uL (ref 0.00–0.07)
Basophils Absolute: 0.1 10*3/uL (ref 0.0–0.1)
Basophils Relative: 0 %
Eosinophils Absolute: 0.1 10*3/uL (ref 0.0–0.5)
Eosinophils Relative: 1 %
HCT: 47.3 % (ref 39.0–52.0)
Hemoglobin: 15.9 g/dL (ref 13.0–17.0)
Immature Granulocytes: 1 %
Lymphocytes Relative: 17 %
Lymphs Abs: 2.5 10*3/uL (ref 0.7–4.0)
MCH: 31.4 pg (ref 26.0–34.0)
MCHC: 33.6 g/dL (ref 30.0–36.0)
MCV: 93.5 fL (ref 80.0–100.0)
Monocytes Absolute: 1.2 10*3/uL — ABNORMAL HIGH (ref 0.1–1.0)
Monocytes Relative: 8 %
Neutro Abs: 10.9 10*3/uL — ABNORMAL HIGH (ref 1.7–7.7)
Neutrophils Relative %: 73 %
Platelets: 336 10*3/uL (ref 150–400)
RBC: 5.06 MIL/uL (ref 4.22–5.81)
RDW: 13.2 % (ref 11.5–15.5)
WBC: 14.7 10*3/uL — ABNORMAL HIGH (ref 4.0–10.5)
nRBC: 0 % (ref 0.0–0.2)

## 2022-01-25 MED ORDER — TETANUS-DIPHTH-ACELL PERTUSSIS 5-2.5-18.5 LF-MCG/0.5 IM SUSY
0.5000 mL | PREFILLED_SYRINGE | Freq: Once | INTRAMUSCULAR | Status: DC
  Filled 2022-01-25: qty 0.5

## 2022-01-25 MED ORDER — SODIUM CHLORIDE 0.9 % IV SOLN
3.0000 g | Freq: Once | INTRAVENOUS | Status: DC
  Filled 2022-01-25: qty 8

## 2022-01-25 NOTE — ED Provider Notes (Signed)
I provided a substantive portion of the care of this patient.  I personally performed the entirety of the exam for this encounter.    Patient reports he cut his finger 4 days ago on a sharp piece of metal and did not think it needed further treatment.  It is gotten progressively swollen and painful.  Has become stiff and he cannot bend it.  Patient has thickened, callus-like wound on the palmar aspect at the DIP.  Entire finger is circumferentially swollen and erythematous.  Patient unable to flex at the interphalangeal or DIP.  concerning for tenosynovitis.  Consult hand specialty. I agree with plan of management.   Charlesetta Shanks, MD 01/25/22 2248

## 2022-01-25 NOTE — ED Provider Triage Note (Cosign Needed)
Emergency Medicine Provider Triage Evaluation Note  Ricky Wu , a 36 y.o. male  was evaluated in triage.  Pt complains of wound to left index finger with swelling. Unsure how initial wound occurred, but states there was a cut to the finger. Last tetanus unknown. Psychiatric history (depression with psychotic features), out of current medications. Last provider visit in July 2023.  Review of Systems  Positive: Wound, swelling Negative: Fever, chills.  Physical Exam  BP (!) 158/117 (BP Location: Left Arm)   Pulse (!) 108   Temp 98.9 F (37.2 C) (Oral)   Resp 14   Ht 6\' 2"  (1.88 m)   Wt 99.8 kg   SpO2 100%   BMI 28.25 kg/m  Gen:   Awake, no distress   Resp:  Normal effort  MSK:   Swelling to left index finger. Unable to flex at DIP. Other:  Cooperative. No thoughts of self-harm  Medical Decision Making  Medically screening exam initiated at 7:46 PM.  Appropriate orders placed.  Ricky Wu was informed that the remainder of the evaluation will be completed by another provider, this initial triage assessment does not replace that evaluation, and the importance of remaining in the ED until their evaluation is complete.     Ricky Quill, NP 01/25/22 2000

## 2022-01-25 NOTE — ED Triage Notes (Signed)
Pt states that he cut his index finger left hand x 4 days ago and now it is swollen. Denies pain at this time. Pt also states that he has mental illness and has not taken his medications.

## 2022-01-25 NOTE — ED Provider Notes (Incomplete)
[place] DEPT Provider Note   CSN: [id number] Arrival date & time: 01/25/22  1924     History {Add pertinent medical, surgical, social history, OB history to HPI:1} Chief Complaint  Patient presents with   Finger Injury    Ricky Wu is a 36 y.o. male with a hx of ADHD, depression, sleep apnea, HTN, and EtOH/tobacco use who presents to the ED with complaints of left index finger pain/swelling. Patient reports he cut his left index finger 4 days prior with a sharp piece of metal by accident. Initially did not think much of it but has had progressive swelling and pain with difficulty moving it. No alleviating factors. Denies fever or chills. Unknown last tetanus. Denies IV drug use.   HPI     Home Medications Prior to Admission medications   Medication Sig Start Date End Date Taking? Authorizing Provider  gabapentin (NEURONTIN) 600 MG tablet Take 1 tablet (600 mg total) by mouth 3 (three) times daily. 01/14/21   Jaynee Eagles, PA-C  hydrOXYzine (ATARAX/VISTARIL) 25 MG tablet Take 0.5-1 tablets (12.5-25 mg total) by mouth every 8 (eight) hours as needed for itching. 01/14/21   Jaynee Eagles, PA-C  ibuprofen (ADVIL) 800 MG tablet Take 1 tablet (800 mg total) by mouth 3 (three) times daily. 08/15/19   Garald Balding, PA-C  sertraline (ZOLOFT) 100 MG tablet Take 1 tablet (100 mg total) by mouth daily. 10/21/21   Mayers, Cari S, PA-C  triamcinolone cream (KENALOG) 0.1 % Apply 1 application topically 2 (two) times daily. 01/14/21   Jaynee Eagles, PA-C      Allergies    Patient has no known allergies.    Review of Systems   Review of Systems  Constitutional:  Negative for chills and fever.  Respiratory:  Negative for shortness of breath.   Cardiovascular:  Negative for chest pain.  Musculoskeletal:  Positive for arthralgias and joint swelling.  All other systems reviewed and are negative.   Physical Exam Updated Vital Signs BP (!) 158/117 (BP  Location: Left Arm)   Pulse (!) 108   Temp 98.9 F (37.2 C) (Oral)   Resp 14   Ht 6\' 2"  (1.88 m)   Wt 99.8 kg   SpO2 100%   BMI 28.25 kg/m  Physical Exam Vitals and nursing note reviewed.  Constitutional:      General: He is not in acute distress.    Appearance: He is well-developed.  HENT:     Head: Normocephalic and atraumatic.  Eyes:     General:        Right eye: No discharge.        Left eye: No discharge.     Conjunctiva/sclera: Conjunctivae normal.  Cardiovascular:     Rate and Rhythm: Tachycardia present.     Comments: 2+ radial pulses Pulmonary:     Effort: Pulmonary effort is normal. No respiratory distress.  Musculoskeletal:     Comments: Upper extremities: Fusiform swelling w/ erythema to the left 2nd finger, most prominent @ the DIP joint. Does have some thickened skin as well as somewhat fluctuant tissue to the ventral DIP/middle phalanx region. Limited ROM- unable to flex much at the DIP other than how the digit is held, limited at the PIP as well. TTP over the distal/middle phalanx & DIP joint.   Skin:    General: Skin is warm and dry.     Capillary Refill: Capillary refill takes less than 2 seconds.  Neurological:  Mental Status: He is alert.     Comments: Clear speech.   Psychiatric:        Behavior: Behavior normal.        Thought Content: Thought content normal.        ED Results / Procedures / Treatments   Labs (all labs ordered are listed, but only abnormal results are displayed) Labs Reviewed  CBC WITH DIFFERENTIAL/PLATELET - Abnormal; Notable for the following components:      Result Value   WBC 14.7 (*)    Neutro Abs 10.9 (*)    Monocytes Absolute 1.2 (*)    All other components within normal limits    EKG None  Radiology DG Finger Index Left  Result Date: 01/25/2022 CLINICAL DATA:  Cut finger, swelling EXAM: LEFT INDEX FINGER 2+V COMPARISON:  None Available. FINDINGS: Diffuse soft tissue swelling throughout the left index  finger. No acute bony abnormality. Specifically, no fracture, subluxation, or dislocation. No bone destruction to suggest osteomyelitis. No radiopaque foreign bodies. Screws seen within the 3rd metacarpal. IMPRESSION: Soft tissue swelling.  No fracture or foreign body. Electronically Signed   By: Rolm Baptise M.D.   On: 01/25/2022 20:16    Procedures Procedures  {Document cardiac monitor, telemetry assessment procedure when appropriate:1}  Medications Ordered in ED Medications  Tdap (BOOSTRIX) injection 0.5 mL (has no administration in time range)    ED Course/ Medical Decision Making/ A&P                           Medical Decision Making  Patient presents to the ED with complaints of left 2nd finger pain/swelling after injury 4 days prior.  Nontoxic, hypertensive and tachycardic.  Doubt hypertensive emergency.  Chart/nursing note reviewed for additional history.  I viewed labs ordered in triage: CBC: Leukocytosis noted.  Reviewed x-ray ordered in triage, agree with radiologist interpretation: Soft tissue swelling.  No fracture or foreign body.  Xray w/o fx/dislocation/radiopaque FB.  Exam findings concerning for flexor tenosynovitis.  Will discuss with hand surgery.  23:04: CONSULT: Discussed patient presentation, exam, work up and concern for flexor tenosynovitis with hand surgeon Dr. Caralyn Guile- recommends a dose of IV abx in the ED, splint, discharge on oral antibiotics and will see patient in clinic, no abx preference.   Discussed w/ attending- recommends augmentin @ discharge, will give unasyn in the ED.  Tetanus updated.  Wound care & splint ordered.   Patient eloped per nursing staff, I attempted to call  This is a shared visit with supervising physician Dr. Johnney Killian who has independently evaluated patient & provided guidance in evaluation/management/disposition, in agreement with care   Final Clinical Impression(s) / ED Diagnoses Final diagnoses:  None    Rx / DC  Orders ED Discharge Orders     None

## 2022-01-26 NOTE — ED Notes (Signed)
Pt left AMA. Encouraged to complete care, but patient ambulated out of ED.

## 2022-01-28 ENCOUNTER — Encounter (HOSPITAL_COMMUNITY): Payer: Self-pay

## 2022-01-28 ENCOUNTER — Emergency Department (HOSPITAL_COMMUNITY)
Admission: EM | Admit: 2022-01-28 | Discharge: 2022-01-28 | Discharge status: Against Medical Advice | Payer: Medicaid Other | Attending: Emergency Medicine | Admitting: Emergency Medicine

## 2022-01-28 DIAGNOSIS — S6992XA Unspecified injury of left wrist, hand and finger(s), initial encounter: Secondary | ICD-10-CM | POA: Diagnosis present

## 2022-01-28 DIAGNOSIS — W228XXA Striking against or struck by other objects, initial encounter: Secondary | ICD-10-CM | POA: Diagnosis not present

## 2022-01-28 DIAGNOSIS — Z5321 Procedure and treatment not carried out due to patient leaving prior to being seen by health care provider: Secondary | ICD-10-CM | POA: Insufficient documentation

## 2022-01-28 NOTE — ED Notes (Signed)
Pt eloped from ED and was seen by triage staff exiting treatment area into lobby. Provider notified.

## 2022-01-28 NOTE — ED Triage Notes (Signed)
Pt BIB [facility] states that he injured his L index finger a week ago on a piece of metal, last tetanus unknown.

## 2022-02-02 ENCOUNTER — Other Ambulatory Visit: Payer: Self-pay

## 2022-02-02 ENCOUNTER — Encounter (HOSPITAL_COMMUNITY): Payer: Self-pay | Admitting: Emergency Medicine

## 2022-02-02 ENCOUNTER — Emergency Department (HOSPITAL_COMMUNITY)
Admission: EM | Admit: 2022-02-02 | Discharge: 2022-02-02 | Discharge status: Against Medical Advice | Payer: Medicaid Other | Attending: Emergency Medicine | Admitting: Emergency Medicine

## 2022-02-02 DIAGNOSIS — Z5321 Procedure and treatment not carried out due to patient leaving prior to being seen by health care provider: Secondary | ICD-10-CM | POA: Diagnosis not present

## 2022-02-02 DIAGNOSIS — M79642 Pain in left hand: Secondary | ICD-10-CM | POA: Diagnosis present

## 2022-02-02 DIAGNOSIS — W228XXA Striking against or struck by other objects, initial encounter: Secondary | ICD-10-CM | POA: Insufficient documentation

## 2022-02-02 NOTE — ED Provider Triage Note (Signed)
Emergency Medicine Provider Triage Evaluation Note  Ricky Wu , a 36 y.o. male  was evaluated in triage.  Pt complains of pain to left pinky following punching a window. Intoxicated on exam.   Review of Systems  Positive: As above Negative: As above  Physical Exam  BP (!) 121/106 (BP Location: Right Arm)   Pulse 96   Temp 97.6 F (36.4 C) (Oral)   Resp (!) 22   SpO2 97%  Gen:   Awake, no distress   Resp:  Normal effort  MSK:   Moves extremities without difficulty  Other:   Medical Decision Making  Medically screening exam initiated at 4:06 AM.  Appropriate orders placed.  Elmon Else was informed that the remainder of the evaluation will be completed by another provider, this initial triage assessment does not replace that evaluation, and the importance of remaining in the ED until their evaluation is complete.     Marita Kansas, PA-C 02/02/22 [phone]

## 2022-02-02 NOTE — ED Notes (Signed)
Patient did not respond when name was called to be seen.

## 2022-02-02 NOTE — ED Triage Notes (Signed)
Patient here after punching a glass window with his left hand.  He is complaining about pain to his left pinky.  Patient is intoxicated upon arrival to ED.

## 2022-02-11 ENCOUNTER — Emergency Department (HOSPITAL_COMMUNITY): Payer: Medicaid Other

## 2022-02-11 ENCOUNTER — Other Ambulatory Visit (HOSPITAL_COMMUNITY): Payer: Self-pay

## 2022-02-11 ENCOUNTER — Emergency Department (HOSPITAL_COMMUNITY)
Admission: EM | Admit: 2022-02-11 | Discharge: 2022-02-11 | Disposition: A | Discharge status: Home / Self Care | Payer: Medicaid Other | Attending: Emergency Medicine | Admitting: Emergency Medicine

## 2022-02-11 DIAGNOSIS — W228XXA Striking against or struck by other objects, initial encounter: Secondary | ICD-10-CM | POA: Insufficient documentation

## 2022-02-11 DIAGNOSIS — R000 Tachycardia, unspecified: Secondary | ICD-10-CM | POA: Insufficient documentation

## 2022-02-11 DIAGNOSIS — F419 Anxiety disorder, unspecified: Secondary | ICD-10-CM

## 2022-02-11 DIAGNOSIS — S6992XA Unspecified injury of left wrist, hand and finger(s), initial encounter: Secondary | ICD-10-CM | POA: Diagnosis present

## 2022-02-11 DIAGNOSIS — Z5900 Homelessness unspecified: Secondary | ICD-10-CM | POA: Diagnosis not present

## 2022-02-11 DIAGNOSIS — S62339A Displaced fracture of neck of unspecified metacarpal bone, initial encounter for closed fracture: Secondary | ICD-10-CM

## 2022-02-11 DIAGNOSIS — S62367A Nondisplaced fracture of neck of fifth metacarpal bone, left hand, initial encounter for closed fracture: Secondary | ICD-10-CM | POA: Diagnosis not present

## 2022-02-11 MED ORDER — AMOXICILLIN-POT CLAVULANATE 875-125 MG PO TABS
1.0000 | ORAL_TABLET | Freq: Once | ORAL | Status: AC
Start: 2022-02-11 — End: 2022-02-11
  Administered 2022-02-11: 1 via ORAL
  Filled 2022-02-11: qty 1

## 2022-02-11 MED ORDER — LORAZEPAM 0.5 MG PO TABS
0.5000 mg | ORAL_TABLET | Freq: Once | ORAL | Status: AC
  Administered 2022-02-11: 0.5 mg via ORAL
  Filled 2022-02-11: qty 1

## 2022-02-11 MED ORDER — SERTRALINE HCL 100 MG PO TABS
100.0000 mg | ORAL_TABLET | Freq: Every day | ORAL | 0 refills | Status: DC
  Filled 2022-02-11: qty 30, 30d supply, fill #0

## 2022-02-11 MED ORDER — AMOXICILLIN-POT CLAVULANATE 875-125 MG PO TABS
1.0000 | ORAL_TABLET | Freq: Two times a day (BID) | ORAL | 0 refills | Status: DC
  Filled 2022-02-11: qty 14, 7d supply, fill #0

## 2022-02-11 MED ORDER — SERTRALINE HCL 50 MG PO TABS
50.0000 mg | ORAL_TABLET | Freq: Every day | ORAL | Status: DC
  Administered 2022-02-11: 50 mg via ORAL
  Filled 2022-02-11: qty 1

## 2022-02-11 MED ORDER — NICOTINE 14 MG/24HR TD PT24: 14.0000 mg | MEDICATED_PATCH | Freq: Every day | TRANSDERMAL | Status: DC

## 2022-02-11 MED ORDER — NICOTINE 21 MG/24HR TD PT24: 21.0000 mg | MEDICATED_PATCH | Freq: Every day | TRANSDERMAL | Status: DC

## 2022-02-11 MED ORDER — GABAPENTIN 600 MG PO TABS
600.0000 mg | ORAL_TABLET | Freq: Two times a day (BID) | ORAL | 0 refills | Status: DC
  Filled 2022-02-11: qty 60, 30d supply, fill #0

## 2022-02-11 NOTE — ED Triage Notes (Signed)
Patient arrived stating he has been off his medication and has anxiety. Also reporting a laceration to his finger two weeks ago that he is concerned is infected.

## 2022-02-11 NOTE — Progress Notes (Addendum)
TOC consulted to provide Medication Assistance and Shelter resources. This CSW informed Ricky Wu, [place], that the pt is ineligible for Medication Assistance due to having Medicaid. This CSW provided and attached shelter resources to the pt's AVS. TOC signing off.   Addend @ 10:30 AM Spoke with pt about resources provided. Provided the pt with bus passes.

## 2022-02-11 NOTE — ED Provider Notes (Signed)
[facility]-EMERGENCY DEPT Provider Note   CSN: [id number] Arrival date & time: 02/11/22  0353     History  Chief Complaint  Patient presents with   Finger Injury   Anxiety    Ricky Wu is a 36 y.o. male who presents with concern for anxiety attack primarily.  Patient states that he is usually on Zoloft and gabapentin but has been without his medications for the last 3 months since he became homeless.  He said that he has been drinking then attempted self medicate but feels very fearful and like he cannot calm his mind down.  He denies any SI HI or AVH.  States he is desiring to get back on his Zoloft which he had stopped due to "I thought I could just get off it did not need it anymore". Additionally patient has pain in the left pinky after punching a window, concern of a broken.  He states that last month he cut his left index finger for which she was seen in the emergency department.  On chart review it appears patient was diagnosed with flexor tenosynovitis of the left index finger, however eloped from the emergency department prior to administration of IV Unasyn and did not pick up outpatient antibiotics or follow-up with hand surgery as recommended. He endorses resolution of pain at this time but does endorse changes in the skin.  States that there was pus coming out of it for which is completely resolved at this time.  He denies any pain in the finger.  I personally reviewed medical records previous history of anxiety depression, ADHD, pretension, and GERD.  Also has history of alcohol abuse.  States that he last drank 2 shots of liquor earlier today.  Denies any recreational drug use aside from alcohol use. HPI     Home Medications Prior to Admission medications   Medication Sig Start Date End Date Taking? Authorizing Provider  amoxicillin-clavulanate (AUGMENTIN) 875-125 MG tablet Take 1 tablet by mouth every 12 (twelve) hours. 02/11/22  Yes Judaea Burgoon,  Furious Chiarelli R, PA-C  gabapentin (NEURONTIN) 600 MG tablet Take 1 tablet (600 mg total) by mouth 2 (two) times daily. 02/11/22 03/13/22 Yes Bernita Beckstrom, Lupe Carney R, PA-C  sertraline (ZOLOFT) 100 MG tablet Take 1 tablet (100 mg total) by mouth daily. 02/11/22 03/13/22 Yes Gwen Edler R, PA-C  hydrOXYzine (ATARAX/VISTARIL) 25 MG tablet Take 0.5-1 tablets (12.5-25 mg total) by mouth every 8 (eight) hours as needed for itching. 01/14/21   Wallis Bamberg, PA-C  triamcinolone cream (KENALOG) 0.1 % Apply 1 application topically 2 (two) times daily. 01/14/21   Wallis Bamberg, PA-C      Allergies    Patient has no known allergies.    Review of Systems   Review of Systems  Skin:  Positive for wound.  Psychiatric/Behavioral:  The patient is nervous/anxious.     Physical Exam Updated Vital Signs BP (!) 187/114 (BP Location: Right Arm)   Pulse (!) 108   Temp 98.4 F (36.9 C) (Oral)   Resp 18   SpO2 99%  Physical Exam Vitals and nursing note reviewed.  Constitutional:      Appearance: He is not ill-appearing or toxic-appearing.  HENT:     Head: Normocephalic and atraumatic.     Mouth/Throat:     Mouth: Mucous membranes are moist.     Pharynx: No oropharyngeal exudate or posterior oropharyngeal erythema.  Eyes:     General:        Right eye: No discharge.  Left eye: No discharge.     Extraocular Movements: Extraocular movements intact.     Conjunctiva/sclera: Conjunctivae normal.     Pupils: Pupils are equal, round, and reactive to light.  Cardiovascular:     Rate and Rhythm: Regular rhythm. Tachycardia present.     Pulses: Normal pulses.     Heart sounds: Normal heart sounds. No murmur heard. Pulmonary:     Effort: Pulmonary effort is normal. No respiratory distress.     Breath sounds: Normal breath sounds. No wheezing or rales.  Abdominal:     General: Bowel sounds are normal. There is no distension.     Palpations: Abdomen is soft.     Tenderness: There is no abdominal  tenderness.  Musculoskeletal:        General: No deformity.       Hands:     Cervical back: Neck supple.     Right lower leg: No edema.     Left lower leg: No edema.  Skin:    General: Skin is warm and dry.     Capillary Refill: Capillary refill takes less than 2 seconds.     Findings: Wound present.  Neurological:     General: No focal deficit present.     Mental Status: He is alert and oriented to person, place, and time. Mental status is at baseline.  Psychiatric:        Mood and Affect: Mood normal.     ED Results / Procedures / Treatments   Labs (all labs ordered are listed, but only abnormal results are displayed) Labs Reviewed - No data to display  EKG None  Radiology DG Hand Complete Left  Result Date: 02/11/2022 CLINICAL DATA:  Left hand wound with recent injury EXAM: LEFT HAND - COMPLETE 3 VIEW COMPARISON:  07/30/2010 FINDINGS: Nondisplaced fifth metacarpal neck fracture. Remote third metacarpal shaft fracture with fixation and completed healing. No opaque foreign body. IMPRESSION: Nondisplaced fifth metacarpal neck fracture. Electronically Signed   By: Tiburcio Pea M.D.   On: 02/11/2022 05:41    Procedures Procedures    Medications Ordered in ED Medications  sertraline (ZOLOFT) tablet 50 mg (has no administration in time range)  nicotine (NICODERM CQ - dosed in mg/24 hours) patch 14 mg (has no administration in time range)  LORazepam (ATIVAN) tablet 0.5 mg (0.5 mg Oral Given 02/11/22 0558)  amoxicillin-clavulanate (AUGMENTIN) 875-125 MG per tablet 1 tablet (1 tablet Oral Given 02/11/22 0600)    ED Course/ Medical Decision Making/ A&P                           Medical Decision Making 36 year old male who presents with concern for anxiety and healing wound to the left hand pain.  Hypertensive, tachycardic and fidgety on intake, appears very anxious.  Cardiac exam with tachycardia with regular rhythm otherwise unremarkable.  Pulmonary exam and abdominal  exams are benign.  Skin exam of the hand as above as well as musculoskeletal injury to the left pinky.  Neurovascular tact in extremities.  Does not appear responding to internal stimuli.  Amount and/or Complexity of Data Reviewed Radiology: ordered.    Details: Plain film of the left Hand with nondisplaced fifth metacarpal neck fracture consistent with hx of axial load punching a window, no radiopaque foreign body or subcutaneous gas.  Visualized with provider.  Risk OTC drugs. Prescription drug management.   We will place hand in an ulnar gutter splint for boxer fracture and  recommend close outpatient follow-up with hand specialist.  Additionally will prescribe course of antibiotics for resolving infection in the left index finger due to patient's concern and status with homelessness living in drug environment with residual open wound. Refill on behavioral health medications prescribed to his pharmacy, however patient will await consultation with TOC given financial instability and unable to obtain his medications independently.  Resting calmly in his hospital bed at this time.  No further work-up warranted in ER at this time.  Clinical concern for emergent underlying injury or condition that would warrant further ED work-up or inpatient management is exceedingly low.  Admission considered but not felt warranted at this time.  Care of this patient signed out to me provider Achille Rich, PA-C at time of shift change.  Patient pending ulnar gutter splint placement as well as TOC consultation at time of shift change.  Antibiotic prescription as well as behavioral health medications prescribed to [place] on outpatient pharmacy.  Ultimately disposition pending completion of treatment and oncoming ED provider reevaluation.  Adeel  voiced understanding of his medical evaluation and treatment plan. Each of their questions answered to their expressed satisfaction.   This chart was dictated using voice  recognition software, Dragon. Despite the best efforts of this provider to proofread and correct errors, errors may still occur which can change documentation meaning.   Final Clinical Impression(s) / ED Diagnoses Final diagnoses:  Anxiety  Closed boxer's fracture, initial encounter    Rx / DC Orders ED Discharge Orders          Ordered    sertraline (ZOLOFT) 100 MG tablet  Daily        02/11/22 0652    gabapentin (NEURONTIN) 600 MG tablet  2 times daily        02/11/22 0652    amoxicillin-clavulanate (AUGMENTIN) 875-125 MG tablet  Every 12 hours        02/11/22 0652              Ferdie Bakken, Eugene Gavia, PA-C 02/11/22 0703    Nira Conn, MD 02/11/22 [phone]

## 2022-02-11 NOTE — Progress Notes (Signed)
Orthopedic Tech Progress Note Patient Details:  Ricky Wu [birth year] [id number]  Ortho Devices Type of Ortho Device: Ulna gutter splint Ortho Device/Splint Location: Left hand Ortho Device/Splint Interventions: Application   Post Interventions Patient Tolerated: Well  [name] E [name] 02/11/2022, 9:08 AM

## 2022-02-11 NOTE — Discharge Instructions (Addendum)
You are seen in the ER today for your hand pain and anxiety.  Been prescribed your medications.  Please pick them up per social worker's recommendation.  Regarding your hand injury you have broken the bone in your hand beneath your pinky.  Please keep the splint in place till you follow-up with the hand specialist.  Return to the ER with any severe symptoms.

## 2022-02-11 NOTE — ED Provider Notes (Addendum)
  Physical Exam  BP (!) 168/108   Pulse 95   Temp 98.5 F (36.9 C) (Oral)   Resp 18   SpO2 99%   Physical Exam Pulmonary:     Effort: Pulmonary effort is normal. No respiratory distress.  Skin:    General: Skin is dry.  Neurological:     General: No focal deficit present.     Mental Status: He is alert. Mental status is at baseline.     Procedures  Procedures  ED Course / MDM    Medical Decision Making Amount and/or Complexity of Data Reviewed Radiology: ordered.  Risk OTC drugs. Prescription drug management.   Accepted handoff at shift change from [facility], PA-C. Please see prior provider note for more detail.   Plan: Awaiting TOC consult and ortho tech then most likely discharge home.   TOC messaged that the patient does not qualify for any medication assistance as he is already on Medicaid.   Care delayed at the ortho techs do no come in till 0900.  Additional Ativan given.  Ortho tech applied ulnar gutter cast. Discussed with patient that he needs to follow up with hand surgery and that the information is on his discharge paperwork as well as shelter resources. We discussed strict return precautions and red flag symptoms. The patient verbalized understanding and agrees to the plan. Patient is stable and being discharged in good condition.      Achille Rich, PA-C 02/11/22 1051    Achille Rich, PA-C 02/11/22 1052    Wynetta Fines, MD 02/11/22 1500

## 2022-02-19 ENCOUNTER — Other Ambulatory Visit (HOSPITAL_COMMUNITY): Payer: Self-pay

## 2022-03-04 ENCOUNTER — Other Ambulatory Visit (HOSPITAL_COMMUNITY): Payer: Self-pay

## 2022-03-08 ENCOUNTER — Other Ambulatory Visit (HOSPITAL_COMMUNITY): Payer: Self-pay

## 2022-04-14 ENCOUNTER — Other Ambulatory Visit: Payer: Self-pay

## 2022-04-14 ENCOUNTER — Emergency Department (HOSPITAL_COMMUNITY)
Admission: EM | Admit: 2022-04-14 | Discharge: 2022-04-14 | Discharge status: Against Medical Advice | Payer: Medicaid Other | Attending: Emergency Medicine | Admitting: Emergency Medicine

## 2022-04-14 DIAGNOSIS — Z5321 Procedure and treatment not carried out due to patient leaving prior to being seen by health care provider: Secondary | ICD-10-CM | POA: Diagnosis not present

## 2022-04-14 DIAGNOSIS — M79672 Pain in left foot: Secondary | ICD-10-CM | POA: Insufficient documentation

## 2022-04-14 DIAGNOSIS — M79671 Pain in right foot: Secondary | ICD-10-CM | POA: Diagnosis not present

## 2022-04-14 MED ORDER — ACETAMINOPHEN 500 MG PO TABS: 1000.0000 mg | ORAL_TABLET | ORAL | Status: DC

## 2022-04-14 MED ORDER — HYDROXYZINE HCL 25 MG PO TABS: 25.0000 mg | ORAL_TABLET | Freq: Once | ORAL | Status: DC

## 2022-04-14 NOTE — ED Provider Triage Note (Signed)
Emergency Medicine Provider Triage Evaluation Note  Ricky Wu , a 37 y.o. male  was evaluated in triage.  Pt complains of BL foot pain after walking and states he needs med refills on SSRI.   Review of Systems  Positive: Foot pain Negative: Fever   Physical Exam  BP (!) 157/118 (BP Location: Right Arm)   Pulse 90   Temp 98 F (36.7 C) (Oral)   Resp 16   SpO2 100%  Gen:   Awake, no distress   Resp:  Normal effort  MSK:   Moves extremities without difficulty  Other:  Trench foot.   Medical Decision Making  Medically screening exam initiated at 4:45 AM.  Appropriate orders placed.  Ricky Wu was informed that the remainder of the evaluation will be completed by another provider, this initial triage assessment does not replace that evaluation, and the importance of remaining in the ED until their evaluation is complete.  SW consult ordered from waiting room.    Ricky Wu [place], [place] 04/14/22 [phone]

## 2022-04-14 NOTE — Discharge Instructions (Signed)
HOMELESS [place] AT 8:00-3:00 PM [place]OPENS AT 8:00 PM  [address]  [place], [place] [address]

## 2022-04-14 NOTE — Progress Notes (Signed)
CSW added homeless resources to patients AVS. CSW also included information about the interactive resource center that now reopens at 8:00 PM every night for the homeless warming center.

## 2022-04-14 NOTE — ED Notes (Signed)
Pt not answer for vitals

## 2022-04-14 NOTE — ED Triage Notes (Signed)
Patient arrived with PTAR from street reports bilateral foot pain for 2 months , he is homeless and walks a lot .

## 2022-04-14 NOTE — ED Notes (Signed)
Pt not answering for vitals 2x  

## 2022-04-17 ENCOUNTER — Emergency Department (HOSPITAL_COMMUNITY)
Admission: EM | Admit: 2022-04-17 | Discharge: 2022-04-17 | Disposition: A | Discharge status: Home / Self Care | Payer: Medicaid Other | Attending: Emergency Medicine | Admitting: Emergency Medicine

## 2022-04-17 DIAGNOSIS — T69022A Immersion foot, left foot, initial encounter: Secondary | ICD-10-CM | POA: Insufficient documentation

## 2022-04-17 DIAGNOSIS — T69021A Immersion foot, right foot, initial encounter: Secondary | ICD-10-CM | POA: Insufficient documentation

## 2022-04-17 DIAGNOSIS — Z5900 Homelessness unspecified: Secondary | ICD-10-CM | POA: Diagnosis not present

## 2022-04-17 DIAGNOSIS — M79671 Pain in right foot: Secondary | ICD-10-CM | POA: Diagnosis present

## 2022-04-17 DIAGNOSIS — X31XXXA Exposure to excessive natural cold, initial encounter: Secondary | ICD-10-CM | POA: Diagnosis not present

## 2022-04-17 DIAGNOSIS — F32A Depression, unspecified: Secondary | ICD-10-CM

## 2022-04-17 DIAGNOSIS — T69029A Immersion foot, unspecified foot, initial encounter: Secondary | ICD-10-CM

## 2022-04-17 MED ORDER — SERTRALINE HCL 100 MG PO TABS: 100.0000 mg | ORAL_TABLET | Freq: Every day | ORAL | 0 refills | Status: DC

## 2022-04-17 MED ORDER — GABAPENTIN 300 MG PO CAPS: 300.0000 mg | ORAL_CAPSULE | Freq: Two times a day (BID) | ORAL | 0 refills | Status: DC

## 2022-04-17 MED ORDER — SERTRALINE HCL 100 MG PO TABS
100.0000 mg | ORAL_TABLET | Freq: Every day | ORAL | 0 refills | Status: DC
  Filled 2022-04-17: qty 30, 30d supply, fill #0

## 2022-04-17 MED ORDER — GABAPENTIN 300 MG PO CAPS
300.0000 mg | ORAL_CAPSULE | Freq: Two times a day (BID) | ORAL | 0 refills | Status: DC
  Filled 2022-04-17: qty 28, 14d supply, fill #0

## 2022-04-17 NOTE — ED Provider Notes (Addendum)
[place] EMERGENCY DEPARTMENT AT [facility] Provider Note   CSN: [id number] Arrival date & time: 04/17/22  0450     History  Chief Complaint  Patient presents with   Foot Pain    Ricky Wu is a 37 y.o. male.   Foot Pain  Patient is 37 year old male  Homeless and has been having issues with trench foot because of moist feet and long hours of walking.  He is also requesting refills of his SSRI  Denies any SI HI or AVH.     Home Medications Prior to Admission medications   Medication Sig Start Date End Date Taking? Authorizing Provider  gabapentin (NEURONTIN) 300 MG capsule Take 1 capsule (300 mg total) by mouth 2 (two) times daily for 14 days. 04/17/22 05/01/22 Yes Lance Galas S, PA  amoxicillin-clavulanate (AUGMENTIN) 875-125 MG tablet Take 1 tablet by mouth every 12 hours. 02/11/22   Sponseller, Eugene Garnet R, PA-C  gabapentin (NEURONTIN) 600 MG tablet Take 1 tablet by mouth 2 times daily. 02/11/22 03/13/22  Sponseller, Eugene Garnet R, PA-C  sertraline (ZOLOFT) 100 MG tablet Take 1 tablet by mouth daily. 04/17/22 05/17/22  Tedd Sias, PA  triamcinolone cream (KENALOG) 0.1 % Apply 1 application topically 2 (two) times daily. 01/14/21   Jaynee Eagles, PA-C      Allergies    Patient has no known allergies.    Review of Systems   Review of Systems  Physical Exam Updated Vital Signs BP (!) 151/97   Pulse 90   Temp 97.9 F (36.6 C) (Oral)   Resp 17   SpO2 97%  Physical Exam Vitals and nursing note reviewed.  Constitutional:      General: He is not in acute distress.    Appearance: Normal appearance. He is not ill-appearing.  HENT:     Head: Normocephalic and atraumatic.  Eyes:     General: No scleral icterus.       Right eye: No discharge.        Left eye: No discharge.     Conjunctiva/sclera: Conjunctivae normal.  Pulmonary:     Effort: Pulmonary effort is normal.     Breath sounds: No stridor.  Musculoskeletal:     Comments: Trench foot  bilateral feet.  No cellulitic changes. Skin is improved from 2 days ago when I last saw patient.  Skin:    General: Skin is warm and dry.  Neurological:     Mental Status: He is alert and oriented to person, place, and time. Mental status is at baseline.     ED Results / Procedures / Treatments   Labs (all labs ordered are listed, but only abnormal results are displayed) Labs Reviewed - No data to display  EKG None  Radiology No results found.  Procedures Procedures    Medications Ordered in ED Medications - No data to display  ED Course/ Medical Decision Making/ A&P                             Medical Decision Making Risk Prescription drug management.   Patient is 37 year old male  Homeless and has been having issues with trench foot because of moist feet and long hours of walking.  He is also requesting refills of his SSRI  Denies any SI HI or AVH.  Patient overall well-appearing on exam. Had an anxiety attack earlier but seems much more calm now.  Will refill his SSRI since he has  been prescribed this in the past and states that it does work well for him.  He does not have a PCP.  I provided him with information for the [facility] instructed him to follow-up there.  Short course of patient's gabapentin was also refilled at a lower dose that has been previously prescribed.  He understands this is a one-time event and gabapentin be refilled in the emergency room.  Patient ambulatory at time of discharge.  Final Clinical Impression(s) / ED Diagnoses Final diagnoses:  Trench foot, unspecified laterality, initial encounter    Rx / DC Orders ED Discharge Orders          Ordered    sertraline (ZOLOFT) 100 MG tablet  Daily        04/17/22 0601    gabapentin (NEURONTIN) 300 MG capsule  2 times daily        04/17/22 0601              Tedd Sias, PA 04/18/22 0503    Tedd Sias, PA 24/58/09 9833    Delora Fuel, MD 82/50/53 [phone]

## 2022-04-17 NOTE — Discharge Instructions (Addendum)
Please follow-up with the [facility] health outpatient clinic.

## 2022-04-17 NOTE — ED Triage Notes (Signed)
Pt reports bilateral foot pain. States that he has trench foot. Also reports an anxiety attack for the last 4 hours.

## 2022-04-18 ENCOUNTER — Emergency Department (HOSPITAL_COMMUNITY)
Admission: EM | Admit: 2022-04-18 | Discharge: 2022-04-19 | Disposition: A | Discharge status: Home / Self Care | Payer: Medicaid Other | Attending: Emergency Medicine | Admitting: Emergency Medicine

## 2022-04-18 ENCOUNTER — Other Ambulatory Visit: Payer: Self-pay

## 2022-04-18 ENCOUNTER — Other Ambulatory Visit (HOSPITAL_COMMUNITY): Payer: Self-pay

## 2022-04-18 DIAGNOSIS — M79671 Pain in right foot: Secondary | ICD-10-CM

## 2022-04-18 DIAGNOSIS — F419 Anxiety disorder, unspecified: Secondary | ICD-10-CM | POA: Insufficient documentation

## 2022-04-18 DIAGNOSIS — Z5900 Homelessness unspecified: Secondary | ICD-10-CM | POA: Insufficient documentation

## 2022-04-18 NOTE — ED Provider Triage Note (Signed)
Emergency Medicine Provider Triage Evaluation Note  Ricky Wu , a 37 y.o. male  was evaluated in triage.  Patient tells me he is very anxious.  Also says that his feet hurt.  Says that he is supposed to take Klonopin and he just needs to "knock out for an hour."  Says that we cannot look at his feet in triage because they are "so bad  Review of Systems  Positive:  Negative:   Physical Exam  BP (!) 146/97 (BP Location: Right Arm)   Pulse 88   Temp 98 F (36.7 C) (Oral)   Resp 18   SpO2 98%  Gen:   Awake, no distress   Resp:  Normal effort  MSK:   Moves extremities without difficulty  Other:  Very pleasant and anxious.  Declines foot exam  Medical Decision Making  Medically screening exam initiated at 7:12 PM.  Appropriate orders placed.  Ricky Wu was informed that the remainder of the evaluation will be completed by another provider, this initial triage assessment does not replace that evaluation, and the importance of remaining in the ED until their evaluation is complete.     Ricky Wu, [place] 04/18/22 1918

## 2022-04-18 NOTE — ED Triage Notes (Signed)
BIBA from [facility] and stating "I have trench foot and had it for 2 weeks" Pt seen yesterday for same.  Pt requesting ativan.

## 2022-04-19 NOTE — ED Notes (Signed)
Patient eating sandwich and began yelling at staff stating the staff talking is annoying and he needs a bed to sleep that he is homeless.

## 2022-04-19 NOTE — ED Notes (Signed)
Pt fussed and cursed this RN out for forgetting his sandwich. Pt stated "do your fucking job" and wanted to yell at staff for not "being quiet" so he could "sleep" because he "hasn't slept in days" This RN politely gave pt the sandwich and cola that he asked for. Pt continues to yell at staff due to not having "enough quietness". Pt also asked for this RN name and said "you're a nurse?" And "you're proud of that?" This RN discharged pt and security was called to escort him off the premises.

## 2022-04-19 NOTE — ED Provider Notes (Signed)
[place] EMERGENCY DEPARTMENT AT [facility] Provider Note   CSN: [id number] Arrival date & time: 04/18/22  1849     History  Chief Complaint  Patient presents with   Anxiety    Ricky Wu is a 37 y.o. male.  The history is provided by the patient.  Illness Location:  B feet Severity:  Mild Onset quality:  Gradual Timing:  Constant Progression:  Unchanged Chronicity:  Chronic Context:  Patient is homeless Relieved by:  Nothing Worsened by:  Nothing Ineffective treatments:  None Associated symptoms: no congestion, no fever, no rash, no vomiting and no wheezing   Also reported he felt anxious when checked in by is now resting comfortably in the bed.       Home Medications Prior to Admission medications   Medication Sig Start Date End Date Taking? Authorizing Provider  amoxicillin-clavulanate (AUGMENTIN) 875-125 MG tablet Take 1 tablet by mouth every 12 hours. 02/11/22   Sponseller, Eugene Garnet R, PA-C  gabapentin (NEURONTIN) 300 MG capsule Take 1 capsule (300 mg total) by mouth 2 (two) times daily for 14 days. 04/17/22 05/01/22  Tedd Sias, PA  gabapentin (NEURONTIN) 600 MG tablet Take 1 tablet by mouth 2 times daily. 02/11/22 03/13/22  Sponseller, Eugene Garnet R, PA-C  sertraline (ZOLOFT) 100 MG tablet Take 1 tablet by mouth daily. 04/17/22 05/17/22  Tedd Sias, PA  triamcinolone cream (KENALOG) 0.1 % Apply 1 application topically 2 (two) times daily. 01/14/21   Jaynee Eagles, PA-C      Allergies    Patient has no known allergies.    Review of Systems   Review of Systems  Constitutional:  Negative for fever.  HENT:  Negative for congestion.   Respiratory:  Negative for wheezing.   Gastrointestinal:  Negative for vomiting.  Musculoskeletal:  Negative for back pain and gait problem.  Skin:  Negative for rash.  All other systems reviewed and are negative.   Physical Exam Updated Vital Signs BP (!) 146/97 (BP Location: Right Arm)   Pulse 88    Temp 98 F (36.7 C) (Oral)   Resp 18   SpO2 98%  Physical Exam Vitals and nursing note reviewed.  Constitutional:      General: He is not in acute distress.    Appearance: He is well-developed. He is not diaphoretic.  HENT:     Head: Normocephalic and atraumatic.  Eyes:     Conjunctiva/sclera: Conjunctivae normal.     Pupils: Pupils are equal, round, and reactive to light.  Cardiovascular:     Rate and Rhythm: Normal rate and regular rhythm.  Pulmonary:     Effort: Pulmonary effort is normal.     Breath sounds: Normal breath sounds. No wheezing or rales.  Abdominal:     General: Bowel sounds are normal.     Palpations: Abdomen is soft.     Tenderness: There is no abdominal tenderness. There is no guarding or rebound.  Musculoskeletal:        General: Normal range of motion.     Cervical back: Normal range of motion and neck supple.     Right ankle: Normal.     Right Achilles Tendon: Normal.     Left ankle: Normal.     Left Achilles Tendon: Normal.     Right foot: Normal range of motion and normal capillary refill. No laceration, bony tenderness or crepitus. Normal pulse.     Left foot: Normal range of motion and normal capillary refill. No laceration,  bony tenderness or crepitus. Normal pulse.     Comments: No wounds   Skin:    General: Skin is warm and dry.     Capillary Refill: Capillary refill takes less than 2 seconds.  Neurological:     General: No focal deficit present.     Mental Status: He is alert and oriented to person, place, and time.     ED Results / Procedures / Treatments   Labs (all labs ordered are listed, but only abnormal results are displayed) Labs Reviewed - No data to display  EKG None  Radiology No results found.  Procedures Procedures    Medications Ordered in ED Medications - No data to display  ED Course/ Medical Decision Making/ A&P                             Medical Decision Making Patient who is homeless with foot issues    Amount and/or Complexity of Data Reviewed External Data Reviewed: notes.    Details: Previous notes reviewed   Risk Risk Details: No signs of infection.  Will provide clean, dry socks in the ED.  Patient is not anxious in the ED.  No SI.  No further testing or treatment indicated.  Stable for discharge with close follow up.      Final Clinical Impression(s) / ED Diagnoses Final diagnoses:  None  Return for intractable cough, coughing up blood, fevers > 100.4 unrelieved by medication, shortness of breath, intractable vomiting, chest pain, shortness of breath, weakness, numbness, changes in speech, facial asymmetry, abdominal pain, passing out, Inability to tolerate liquids or food, cough, altered mental status or any concerns. No signs of systemic illness or infection. The patient is nontoxic-appearing on exam and vital signs are within normal limits.  I have reviewed the triage vital signs and the nursing notes. Pertinent labs & imaging results that were available during my care of the patient were reviewed by me and considered in my medical decision making (see chart for details). After history, exam, and medical workup I feel the patient has been appropriately medically screened and is safe for discharge home. Pertinent diagnoses were discussed with the patient. Patient was given return precautions.   Rx / DC Orders ED Discharge Orders     None         Madalynne Gutmann, MD 04/19/22 4696

## 2022-04-22 ENCOUNTER — Encounter (HOSPITAL_COMMUNITY): Payer: Self-pay

## 2022-04-22 ENCOUNTER — Other Ambulatory Visit: Payer: Self-pay

## 2022-04-22 ENCOUNTER — Emergency Department (HOSPITAL_COMMUNITY)
Admission: EM | Admit: 2022-04-22 | Discharge: 2022-04-22 | Disposition: A | Discharge status: Home / Self Care | Payer: Medicaid Other | Attending: Emergency Medicine | Admitting: Emergency Medicine

## 2022-04-22 DIAGNOSIS — W268XXA Contact with other sharp object(s), not elsewhere classified, initial encounter: Secondary | ICD-10-CM | POA: Insufficient documentation

## 2022-04-22 DIAGNOSIS — Y92007 Garden or yard of unspecified non-institutional (private) residence as the place of occurrence of the external cause: Secondary | ICD-10-CM | POA: Diagnosis not present

## 2022-04-22 DIAGNOSIS — Z5900 Homelessness unspecified: Secondary | ICD-10-CM | POA: Insufficient documentation

## 2022-04-22 DIAGNOSIS — Y9389 Activity, other specified: Secondary | ICD-10-CM | POA: Insufficient documentation

## 2022-04-22 DIAGNOSIS — S61411A Laceration without foreign body of right hand, initial encounter: Secondary | ICD-10-CM | POA: Insufficient documentation

## 2022-04-22 DIAGNOSIS — S6991XA Unspecified injury of right wrist, hand and finger(s), initial encounter: Secondary | ICD-10-CM | POA: Diagnosis present

## 2022-04-22 NOTE — ED Notes (Signed)
Gave patient graham crackers and applesauce, and jello, a new pair of socks.

## 2022-04-22 NOTE — ED Provider Notes (Signed)
[place] [facility] Provider Note   CSN: [id number] Arrival date & time: 04/22/22  3016     History  No chief complaint on file.   Ricky Wu is a 37 y.o. male.  Patient is a 37 year old male with past medical history of generalized anxiety disorder, alcohol abuse, homelessness.  Patient presenting today for evaluation of a right hand laceration.  He reports performing yard work yesterday at [employer] about a piece of glass.  Patient is also homeless.  He normally stays in [place], but came to [place] to be closer to his mother who is in a nursing home here.  He is also requesting something to eat and assistance with resources to help him "get back on his feet".  He also describes bilateral trench foot.  The history is provided by the patient.       Home Medications Prior to Admission medications   Medication Sig Start Date End Date Taking? Authorizing Provider  amoxicillin-clavulanate (AUGMENTIN) 875-125 MG tablet Take 1 tablet by mouth every 12 hours. 02/11/22   Sponseller, Ricky Garnet R, PA-C  gabapentin (NEURONTIN) 300 MG capsule Take 1 capsule (300 mg total) by mouth 2 (two) times daily for 14 days. 04/17/22 05/01/22  Ricky Sias, PA  gabapentin (NEURONTIN) 600 MG tablet Take 1 tablet by mouth 2 times daily. 02/11/22 03/13/22  Sponseller, Ricky Garnet R, PA-C  sertraline (ZOLOFT) 100 MG tablet Take 1 tablet by mouth daily. 04/17/22 05/17/22  Ricky Sias, PA  triamcinolone cream (KENALOG) 0.1 % Apply 1 application topically 2 (two) times daily. 01/14/21   Ricky Eagles, PA-C      Allergies    Patient has no known allergies.    Review of Systems   Review of Systems  All other systems reviewed and are negative.   Physical Exam Updated Vital Signs BP (!) 166/113 (BP Location: Right Arm)   Pulse 92   Temp 98.2 F (36.8 C) (Oral)   Resp 18   Ht 6\' 2"  (1.88 m)   Wt 99.8 kg   SpO2 100%   BMI 28.25 kg/m  Physical  Exam Vitals and nursing note reviewed.  Constitutional:      General: He is not in acute distress.    Appearance: He is well-developed. He is not diaphoretic.     Comments: Patient is a somewhat disheveled, unkempt 37 year old male.  HENT:     Head: Normocephalic and atraumatic.  Cardiovascular:     Rate and Rhythm: Normal rate and regular rhythm.     Heart sounds: No murmur heard.    No friction rub.  Pulmonary:     Effort: Pulmonary effort is normal. No respiratory distress.     Breath sounds: Normal breath sounds. No wheezing or rales.  Abdominal:     General: Bowel sounds are normal. There is no distension.     Palpations: Abdomen is soft.     Tenderness: There is no abdominal tenderness.  Musculoskeletal:        General: Normal range of motion.     Cervical back: Normal range of motion and neck supple.     Comments: Bilateral feet are noted to have calluses and some skin breakdown.  There is a 2.5 cm, superficial, healing laceration.  The thenar eminence of the right hand.  No purulent drainage or significant surrounding erythema is noted.  Skin:    General: Skin is warm and dry.  Neurological:     Mental Status: He  is alert and oriented to person, place, and time.     Coordination: Coordination normal.     ED Results / Procedures / Treatments   Labs (all labs ordered are listed, but only abnormal results are displayed) Labs Reviewed - No data to display  EKG None  Radiology No results found.  Procedures Procedures    Medications Ordered in ED Medications - No data to display  ED Course/ Medical Decision Making/ A&P  Patient presenting with complaints of right hand laceration that appears to be at least 1 day old and not in need of suturing, only local wound care.  He also complains of homelessness and not being familiar with the resources in [place].  He recently relocated here to be closer to his mom who is in a nursing home.  Patient tells me he has  been homeless since September when him and his mother lost their apartment.  He tells me he is unable to work due to his "mental condition".    At this point, patient to be provided with food and will be given resources for homeless shelters in the area.  Final Clinical Impression(s) / ED Diagnoses Final diagnoses:  None    Rx / DC Orders ED Discharge Orders     None         Veryl Speak, MD 04/22/22 [phone]

## 2022-04-22 NOTE — ED Triage Notes (Signed)
Pt said that he is homeless, came here to visit his mother in a nursing home and would like to get some resources to help him get back on his feet. He also cut his rt palm yesterday and would like to have it looked at.

## 2022-04-29 ENCOUNTER — Other Ambulatory Visit: Payer: Self-pay

## 2022-04-29 ENCOUNTER — Encounter (HOSPITAL_COMMUNITY): Payer: Self-pay

## 2022-04-29 ENCOUNTER — Emergency Department (HOSPITAL_COMMUNITY)
Admission: EM | Admit: 2022-04-29 | Discharge: 2022-04-29 | Disposition: A | Discharge status: Home / Self Care | Payer: Medicaid Other | Attending: Emergency Medicine | Admitting: Emergency Medicine

## 2022-04-29 DIAGNOSIS — F419 Anxiety disorder, unspecified: Secondary | ICD-10-CM | POA: Diagnosis present

## 2022-04-29 DIAGNOSIS — Z760 Encounter for issue of repeat prescription: Secondary | ICD-10-CM | POA: Insufficient documentation

## 2022-04-29 DIAGNOSIS — Z5900 Homelessness unspecified: Secondary | ICD-10-CM | POA: Insufficient documentation

## 2022-04-29 MED ORDER — GABAPENTIN 600 MG PO TABS: 600.0000 mg | ORAL_TABLET | Freq: Two times a day (BID) | ORAL | 0 refills | Status: DC
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
## 2022-04-29 NOTE — ED Triage Notes (Addendum)
Pt arrived states that he is out of his gabapentin, ran out 2 days ago. States that he hasn't been able to sleep and is having a lot of anxiety, pt states that he still have his Zoloft and has been taking that as prescribed   Denies SI and HI

## 2022-04-29 NOTE — ED Notes (Signed)
Pt ambulatory out of department with steady gait

## 2022-04-30 ENCOUNTER — Emergency Department (HOSPITAL_COMMUNITY)
Admission: EM | Admit: 2022-04-30 | Discharge: 2022-04-30 | Discharge status: Against Medical Advice | Payer: Medicaid Other

## 2022-04-30 ENCOUNTER — Emergency Department (HOSPITAL_COMMUNITY)
Admission: EM | Admit: 2022-04-30 | Discharge: 2022-04-30 | Disposition: A | Discharge status: Home / Self Care | Payer: Medicaid Other | Attending: Emergency Medicine | Admitting: Emergency Medicine

## 2022-04-30 ENCOUNTER — Other Ambulatory Visit: Payer: Self-pay

## 2022-04-30 ENCOUNTER — Encounter (HOSPITAL_COMMUNITY): Payer: Self-pay

## 2022-04-30 DIAGNOSIS — Z5900 Homelessness unspecified: Secondary | ICD-10-CM | POA: Diagnosis not present

## 2022-04-30 DIAGNOSIS — F419 Anxiety disorder, unspecified: Secondary | ICD-10-CM | POA: Diagnosis not present

## 2022-04-30 DIAGNOSIS — Z765 Malingerer [conscious simulation]: Secondary | ICD-10-CM | POA: Insufficient documentation
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
## 2022-04-30 NOTE — ED Notes (Signed)
Pt reports he is homeless and usually finds shelters in [place] but has come to [place] to visit his mother at [facility] and he has been finding it difficult to locate stable shelter here in [place].

## 2022-04-30 NOTE — ED Notes (Signed)
Pt informed that he was being discharged by the ED provider. Pt states the he doesn't want to go and that its to cold out and that we have to let him sleep here.  Pt informed that his medical work up is completed and that he has been discharged and needs to leave the room.  Pt then states "No, I am not leaving", RN told pt that security would escort him off the premises if he refused to leave or he could walk himself out.

## 2022-04-30 NOTE — ED Notes (Signed)
Called for Pt from waiting area X 1, X2, X3. No response.  

## 2022-04-30 NOTE — ED Provider Notes (Signed)
  [facility] Provider Note   CSN: [id number] Arrival date & time: 04/30/22  0301     History  Chief Complaint  Patient presents with   Anxiety    Ricky Wu is a 37 y.o. male.  Patient here because he is homeless and malingering.       Home Medications Prior to Admission medications   Medication Sig Start Date End Date Taking? Authorizing Provider  gabapentin (NEURONTIN) 600 MG tablet Take 1 tablet by mouth 2 times daily. 04/29/22 05/29/22  Orpah Greek, MD  sertraline (ZOLOFT) 100 MG tablet Take 1 tablet by mouth daily. 04/17/22 05/17/22  Tedd Sias, PA      Allergies    Patient has no known allergies.    Review of Systems   Review of Systems  Physical Exam Updated Vital Signs BP (!) 131/104 (BP Location: Left Arm)   Pulse 91   Temp 97.7 F (36.5 C) (Oral)   Resp 18   Ht 6\' 2"  (1.88 m)   Wt 99 kg   SpO2 100%   BMI 28.02 kg/m  Physical Exam Vitals and nursing note reviewed.  Constitutional:      Appearance: Normal appearance.  HENT:     Head: Atraumatic.  Cardiovascular:     Rate and Rhythm: Normal rate and regular rhythm.  Pulmonary:     Effort: Pulmonary effort is normal.     Breath sounds: Normal breath sounds.  Neurological:     General: No focal deficit present.     Mental Status: He is alert.     ED Results / Procedures / Treatments   Labs (all labs ordered are listed, but only abnormal results are displayed) Labs Reviewed - No data to display  EKG None  Radiology No results found.  Procedures Procedures    Medications Ordered in ED Medications - No data to display  ED Course/ Medical Decision Making/ A&P                             Medical Decision Making  Patient here because it is cold outside and he is homeless.  Seen last night for the same problems.        Final Clinical Impression(s) / ED Diagnoses Final diagnoses:  Homeless  Malingering    Rx /  DC Orders ED Discharge Orders     None         Ronon Ferger, Gwenyth Allegra, MD 04/30/22 [phone]

## 2022-05-01 ENCOUNTER — Encounter (HOSPITAL_COMMUNITY): Payer: Self-pay

## 2022-05-01 ENCOUNTER — Emergency Department (HOSPITAL_COMMUNITY)
Admission: EM | Admit: 2022-05-01 | Discharge: 2022-05-01 | Disposition: A | Discharge status: Home / Self Care | Payer: Medicaid Other | Attending: Emergency Medicine | Admitting: Emergency Medicine

## 2022-05-01 ENCOUNTER — Other Ambulatory Visit: Payer: Self-pay

## 2022-05-01 DIAGNOSIS — R000 Tachycardia, unspecified: Secondary | ICD-10-CM | POA: Insufficient documentation

## 2022-05-01 DIAGNOSIS — F410 Panic disorder [episodic paroxysmal anxiety] without agoraphobia: Secondary | ICD-10-CM | POA: Insufficient documentation

## 2022-05-01 DIAGNOSIS — F17200 Nicotine dependence, unspecified, uncomplicated: Secondary | ICD-10-CM | POA: Diagnosis not present

## 2022-05-01 DIAGNOSIS — F419 Anxiety disorder, unspecified: Secondary | ICD-10-CM | POA: Diagnosis present

## 2022-05-01 MED ORDER — GABAPENTIN 600 MG PO TABS: 600.0000 mg | ORAL_TABLET | Freq: Two times a day (BID) | ORAL | 0 refills | Status: DC

## 2022-05-01 MED ORDER — GABAPENTIN 300 MG PO CAPS
300.0000 mg | ORAL_CAPSULE | Freq: Once | ORAL | Status: AC
  Administered 2022-05-01: 300 mg via ORAL
  Filled 2022-05-01: qty 1
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
## 2022-05-01 NOTE — Discharge Instructions (Addendum)
Please use resource below to seek for help and sheltering.

## 2022-05-01 NOTE — ED Triage Notes (Signed)
Pt BIB [facility] from [employer] w c/o anxiety. Pt originally went to [facility] yesterday for his anxiety and received a Rx for his anxiety but lost it. Pt has hx of HTN.  EMS VS BP 178/130 P 112 R 20 O2 97% RA

## 2022-05-23 ENCOUNTER — Emergency Department (HOSPITAL_COMMUNITY)
Admission: EM | Admit: 2022-05-23 | Discharge: 2022-05-23 | Disposition: A | Discharge status: Home / Self Care | Payer: Medicaid Other | Attending: Student | Admitting: Student

## 2022-05-23 ENCOUNTER — Emergency Department (HOSPITAL_COMMUNITY): Payer: Medicaid Other

## 2022-05-23 DIAGNOSIS — I10 Essential (primary) hypertension: Secondary | ICD-10-CM | POA: Insufficient documentation

## 2022-05-23 DIAGNOSIS — W268XXA Contact with other sharp object(s), not elsewhere classified, initial encounter: Secondary | ICD-10-CM | POA: Insufficient documentation

## 2022-05-23 DIAGNOSIS — S6991XA Unspecified injury of right wrist, hand and finger(s), initial encounter: Secondary | ICD-10-CM | POA: Diagnosis present

## 2022-05-23 DIAGNOSIS — R0781 Pleurodynia: Secondary | ICD-10-CM | POA: Insufficient documentation

## 2022-05-23 DIAGNOSIS — S62339A Displaced fracture of neck of unspecified metacarpal bone, initial encounter for closed fracture: Secondary | ICD-10-CM | POA: Diagnosis not present

## 2022-05-23 DIAGNOSIS — Y92149 Unspecified place in prison as the place of occurrence of the external cause: Secondary | ICD-10-CM | POA: Insufficient documentation

## 2022-05-23 DIAGNOSIS — F17210 Nicotine dependence, cigarettes, uncomplicated: Secondary | ICD-10-CM | POA: Insufficient documentation

## 2022-05-23 MED ORDER — NAPROXEN 375 MG PO TABS: 375.0000 mg | ORAL_TABLET | Freq: Two times a day (BID) | ORAL | 0 refills | Status: DC

## 2022-05-23 MED ORDER — NAPROXEN 500 MG PO TABS
500.0000 mg | ORAL_TABLET | Freq: Once | ORAL | Status: AC
  Administered 2022-05-23: 500 mg via ORAL
  Filled 2022-05-23: qty 1

## 2022-05-23 NOTE — Progress Notes (Signed)
Orthopedic Tech Progress Note Patient Details:  Ricky Wu [birth year] [id number]  Ortho Devices Type of Ortho Device: Ulna gutter splint Ortho Device/Splint Location: rue Ortho Device/Splint Interventions: Ordered, Application, Adjustment   Post Interventions Patient Tolerated: Well Instructions Provided: Adjustment of device, Care of device  Karolee Stamps 05/23/2022, 3:36 AM
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
## 2022-06-16 ENCOUNTER — Emergency Department (HOSPITAL_COMMUNITY)
Admission: EM | Admit: 2022-06-16 | Discharge: 2022-06-16 | Disposition: A | Discharge status: Home / Self Care | Payer: Medicaid Other | Attending: Emergency Medicine | Admitting: Emergency Medicine

## 2022-06-16 ENCOUNTER — Other Ambulatory Visit: Payer: Self-pay

## 2022-06-16 ENCOUNTER — Ambulatory Visit (HOSPITAL_COMMUNITY)
Admission: EM | Admit: 2022-06-16 | Discharge: 2022-06-16 | Disposition: A | Discharge status: Home / Self Care | Payer: Medicaid Other | Source: Home / Self Care

## 2022-06-16 ENCOUNTER — Emergency Department (HOSPITAL_COMMUNITY): Payer: Medicaid Other

## 2022-06-16 ENCOUNTER — Encounter (HOSPITAL_COMMUNITY): Payer: Self-pay | Admitting: *Deleted

## 2022-06-16 ENCOUNTER — Ambulatory Visit (HOSPITAL_COMMUNITY)
Admission: EM | Admit: 2022-06-16 | Discharge: 2022-06-16 | Disposition: A | Discharge status: Home / Self Care | Payer: Medicaid Other | Attending: Family Medicine | Admitting: Family Medicine

## 2022-06-16 DIAGNOSIS — Z5900 Homelessness unspecified: Secondary | ICD-10-CM | POA: Diagnosis not present

## 2022-06-16 DIAGNOSIS — F411 Generalized anxiety disorder: Secondary | ICD-10-CM | POA: Diagnosis not present

## 2022-06-16 DIAGNOSIS — Z765 Malingerer [conscious simulation]: Secondary | ICD-10-CM | POA: Diagnosis not present

## 2022-06-16 DIAGNOSIS — S6291XA Unspecified fracture of right wrist and hand, initial encounter for closed fracture: Secondary | ICD-10-CM

## 2022-06-16 DIAGNOSIS — R0781 Pleurodynia: Secondary | ICD-10-CM | POA: Diagnosis not present

## 2022-06-16 DIAGNOSIS — X58XXXA Exposure to other specified factors, initial encounter: Secondary | ICD-10-CM | POA: Diagnosis not present

## 2022-06-16 DIAGNOSIS — F410 Panic disorder [episodic paroxysmal anxiety] without agoraphobia: Secondary | ICD-10-CM

## 2022-06-16 DIAGNOSIS — Y92149 Unspecified place in prison as the place of occurrence of the external cause: Secondary | ICD-10-CM | POA: Diagnosis not present

## 2022-06-16 DIAGNOSIS — F1994 Other psychoactive substance use, unspecified with psychoactive substance-induced mood disorder: Secondary | ICD-10-CM | POA: Insufficient documentation

## 2022-06-16 DIAGNOSIS — M79641 Pain in right hand: Secondary | ICD-10-CM | POA: Diagnosis present

## 2022-06-16 MED ORDER — GABAPENTIN 300 MG PO CAPS: 300.0000 mg | ORAL_CAPSULE | Freq: Three times a day (TID) | ORAL | 0 refills | Status: DC

## 2022-06-16 MED ORDER — SERTRALINE HCL 50 MG PO TABS: 50.0000 mg | ORAL_TABLET | Freq: Every day | ORAL | 0 refills | Status: DC

## 2022-06-16 MED ORDER — IBUPROFEN 400 MG PO TABS
400.0000 mg | ORAL_TABLET | Freq: Once | ORAL | Status: AC | PRN
  Administered 2022-06-16: 400 mg via ORAL
  Filled 2022-06-16: qty 1

## 2022-06-16 MED ORDER — HYDROXYZINE HCL 25 MG PO TABS
25.0000 mg | ORAL_TABLET | ORAL | Status: AC
  Administered 2022-06-16: 25 mg via ORAL
  Filled 2022-06-16: qty 1
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
## 2022-06-16 NOTE — ED Provider Notes (Signed)
[facility] Provider Note   CSN: [id number] Arrival date & time: 06/16/22  0245     History  Chief Complaint  Patient presents with   Hand Pain        Headache    Ricky Wu is a 37 y.o. male.  HPI     This is a 37 year old male who presents with persistent right hand pain and anxiety.  Patient was seen and evaluated on 2/26.  At that time he had fractures of the metatarsal of the right hand.  He has since been in jail.  He states he removed his splint.  He was unsure whether he should have a splint.  He states he has had some anxiety.  No new injury.  Also reporting left rib pain but "they told me I did not break a rib."  Denies shortness of breath.  Home Medications Prior to Admission medications   Medication Sig Start Date End Date Taking? Authorizing Provider  gabapentin (NEURONTIN) 600 MG tablet Take 1 tablet by mouth 2 times daily. 05/01/22 05/06/22  Domenic Moras, PA-C  naproxen (NAPROSYN) 375 MG tablet Take 1 tablet (375 mg total) by mouth 2 (two) times daily. 05/23/22   Kommor, [place], MD  sertraline (ZOLOFT) 100 MG tablet Take 1 tablet by mouth daily. 04/17/22 05/17/22  Tedd Sias, PA      Allergies    Patient has no known allergies.    Review of Systems   Review of Systems  Constitutional:  Negative for fever.  Respiratory:  Negative for shortness of breath.   Cardiovascular:  Negative for chest pain.  Gastrointestinal:  Negative for abdominal pain, nausea and vomiting.  Musculoskeletal:        Right hand pain  All other systems reviewed and are negative.   Physical Exam Updated Vital Signs BP (!) 136/108 (BP Location: Right Arm)   Pulse 100   Temp 98.1 F (36.7 C) (Oral)   Resp 18   Ht 1.854 m (6\' 1" )   Wt 90.7 kg   SpO2 100%   BMI 26.39 kg/m  Physical Exam Vitals and nursing note reviewed.  Constitutional:      Appearance: He is well-developed.     Comments: Disheveled but non-ill-appearing   HENT:     Head: Normocephalic and atraumatic.     Mouth/Throat:     Mouth: Mucous membranes are moist.  Eyes:     Pupils: Pupils are equal, round, and reactive to light.  Cardiovascular:     Rate and Rhythm: Normal rate and regular rhythm.     Heart sounds: Normal heart sounds. No murmur heard. Pulmonary:     Effort: Pulmonary effort is normal. No respiratory distress.  Abdominal:     Palpations: Abdomen is soft.     Tenderness: There is no rebound.  Musculoskeletal:     Cervical back: Neck supple.     Comments: Tenderness to palpation right fifth mid metacarpal, no overlying skin changes, no crepitus, vascular intact  Lymphadenopathy:     Cervical: No cervical adenopathy.  Skin:    General: Skin is warm and dry.  Neurological:     Mental Status: He is alert and oriented to person, place, and time.  Psychiatric:        Mood and Affect: Mood normal.     ED Results / Procedures / Treatments   Labs (all labs ordered are listed, but only abnormal results are displayed) Labs Reviewed - No  data to display  EKG None  Radiology DG Hand Complete Right  Result Date: 06/16/2022 CLINICAL DATA:  Hand brain from previous fourth and fifth metacarpal fractures last month. EXAM: RIGHT HAND - COMPLETE 3+ VIEW COMPARISON:  Study of 05/23/2022 FINDINGS: There is been no significant interval change in positioning or fragments of the comminuted fourth and fifth metacarpal neck fractures described previously. The distal fragments still show mild impaction, ventral angulation and slight radial translation. No new fracture has become apparent. Arthritic changes are not seen. A bone island is again noted in the radial styloid. The only noteworthy change is the observation of faint healing callus over most of the fracture margins of both fractures. In all other respects there are no further notable changes. Dorsoulnar soft tissue swelling is still seen but improved. IMPRESSION: No significant change in  positioning or fragments of the comminuted fourth and fifth metacarpal neck fractures. The only noteworthy change is the observation of faint healing callus over most of the fracture margins of both fractures, and interval improvement in soft tissue swelling. Electronically Signed   By: Telford Nab M.D.   On: 06/16/2022 06:13    Procedures Procedures    Medications Ordered in ED Medications  ibuprofen (ADVIL) tablet 400 mg (400 mg Oral Given 06/16/22 0328)    ED Course/ Medical Decision Making/ A&P                             Medical Decision Making Amount and/or Complexity of Data Reviewed Radiology: ordered.  Risk Prescription drug management.   This patient presents to the ED for concern of right hand pain and anxiety, this involves an extensive number of treatment options, and is a complaint that carries with it a high risk of complications and morbidity.  I considered the following differential and admission for this acute, potentially life threatening condition.  The differential diagnosis includes recurrent injury, pain from known prior injury  MDM:    This is a 37 year old male who presents mostly with right hand pain.  He has been in jail and took a splint.  Initially was diagnosed on 2/26.  X-rays are stable and may show callus beginning to form.  Ulnar gutter splint applied for continued support.  He was redirected back to Dr. Doran Durand who was on-call during his initial evaluation.  (Labs, imaging, consults)  Labs: I Ordered, and personally interpreted labs.  The pertinent results include: None  Imaging Studies ordered: I ordered imaging studies including hand x-ray I independently visualized and interpreted imaging. I agree with the radiologist interpretation  Additional history obtained from review.  External records from outside source obtained and reviewed including prior ED visit  Cardiac Monitoring: The patient was not maintained on a cardiac monitor.  If on  the cardiac monitor, I personally viewed and interpreted the cardiac monitored which showed an underlying rhythm of: N/A  Reevaluation: After the interventions noted above, I reevaluated the patient and found that they have :stayed the same  Social Determinants of Health:  homeless  Disposition: Discharge  Co morbidities that complicate the patient evaluation  Past Medical History:  Diagnosis Date   ADHD (attention deficit hyperactivity disorder)    Anhedonia    Anxiety    Anxiety disorder    Confusion    Depression    Disorganized thought process    GERD (gastroesophageal reflux disease)    Hypertension    Penis disorder    Sleep  apnea    mild no cpap or bipap     Medicines Meds ordered this encounter  Medications   ibuprofen (ADVIL) tablet 400 mg    I have reviewed the patients home medicines and have made adjustments as needed  Problem List / ED Course: Problem List Items Addressed This Visit   None Visit Diagnoses     Closed fracture of right hand, initial encounter    -  Primary                   Final Clinical Impression(s) / ED Diagnoses Final diagnoses:  Closed fracture of right hand, initial encounter    Rx / DC Orders ED Discharge Orders     None         Merryl Hacker, MD 06/16/22 [phone]

## 2022-06-16 NOTE — ED Provider Notes (Signed)
Behavioral Health Urgent Care Medical Screening Exam  Patient Name: Ricky Wu MRN: [id number] Date of Evaluation: 06/16/22 Chief Complaint:   Diagnosis:  Final diagnoses:  GAD (generalized anxiety disorder)  Substance induced mood disorder ([place])  Homelessness    History of Present illness:  Ricky Wu 37 y.o., male with history of GAD, ADHD, MDD, OCD, patient presented to [facility] as a walk in voluntarily, unaccompanied requesting medication management for GAD and restarting psychiatric medications.   Ricky Wu, 37 y.o., male patient seen face to face by this provider, consulted with Dr. Dwyane Dee; and chart reviewed on 06/16/22.   On evaluation JERRIUS RICCIARDELLI reports recently getting out of jail recently, after serving 15 days for shoplifting and simple assault. Patient is homeless and referred by the ER Provider here for evaluation as patient reported worsening anxiety while being treated in the ER for a right hand fracture. He reports previously followed by Jinny Blossom, [facility], and [facility] for metal health care and medication management. He was most recently prescribed Gabapentin and Zoloft and he is interested in restarting both of these medications and establishing with outpatient services for ongoing medication management and therapy.  Patient admits to substance use. 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 He last drank beer last night and used meth earlier yesterday. He admits that he would use more meth if he had the money to cover the cost.He feels if he could restart stimulant medication this would result in him no longer using methamphetamine. He denies in current withdrawal symptoms. He is homeless. Never married and no children.  During evaluation WILLA STOCKSTILL is (position) in no acute distress.  He is alert, oriented x 4, calm, cooperative and attentive. His mood is euthymic with congruent  affect. He has normal speech, and behavior.  Objectively there is no evidence of psychosis/mania or delusional thinking.  Patient is able to converse coherently, goal directed thoughts, no distractibility, or pre-occupation.  He also denies suicidal/self-harm/homicidal ideation, psychosis, and paranoia.  Patient answered question appropriately.     [place] ED from 06/16/2022 in [facility] Most recent reading at 06/16/2022  8:23 AM ED from 06/16/2022 in [facility] Emergency Department at [facility] Most recent reading at 06/16/2022  3:09 AM ED from 05/01/2022 in [facility] Emergency Department at [facility] Most recent reading at 05/01/2022  3:26 AM  C-SSRS RISK CATEGORY No Risk No Risk Low Risk       Psychiatric Specialty Exam  Presentation  General Appearance:Appropriate for Environment  Eye Contact:Good  Speech:Clear and Coherent  Speech Volume:Normal  Handedness:Right   Mood and Affect  Mood:Euthymic  Affect:Appropriate   Thought Process  Thought Processes:Coherent  Descriptions of Associations:Intact  Orientation:Full (Time, Place and Person)  Thought Content:Logical    Hallucinations:None  Ideas of Reference:None  Suicidal Thoughts:No  Homicidal Thoughts:No   Sensorium  Memory:Immediate Good; Recent Good; Remote Good  Judgment:Good  Insight:No data recorded  Executive Functions  Concentration:Good  Attention Span:Good  [place]  Language:Good   Psychomotor Activity  Psychomotor Activity:Normal   Assets  Assets:Communication Skills; Desire for Improvement; Financial Resources/Insurance   Sleep  Sleep:Fair  Number of hours: No data recorded  Physical Exam: Physical Exam Constitutional:      Appearance: Normal appearance.  HENT:     Head: Normocephalic and atraumatic.  Eyes:     Extraocular Movements:  Extraocular movements intact.     Pupils: Pupils are  equal, round, and reactive to light.  Cardiovascular:     Rate and Rhythm: Normal rate.  Pulmonary:     Effort: Pulmonary effort is normal.  Musculoskeletal:        General: Normal range of motion.  Skin:    General: Skin is warm.     Capillary Refill: Capillary refill takes less than 2 seconds.  Neurological:     General: No focal deficit present.     Mental Status: He is alert.    Review of Systems  Psychiatric/Behavioral:  Positive for substance abuse. Negative for depression and suicidal ideas. The patient is nervous/anxious and has insomnia.    Blood pressure (!) 139/106, pulse 88, temperature 97.7 F (36.5 C), temperature source Oral, resp. rate 17, SpO2 98 %. There is no height or weight on file to calculate BMI.  Musculoskeletal: Strength & Muscle Tone: within normal limits Gait & Station: normal Patient leans: N/A   [facility] MSE Discharge Disposition  Follow up and Recommendations: Based on my evaluation the patient does not appear to have an emergency medical condition and can be discharged with resources and follow up care in outpatient services for Medication Management, Substance Abuse Intensive Outpatient Program, and Individual Therapy. Given information to follow-up at [facility] Outpatient.Agreed to refill chronic Zoloft and Gabapentin.  Patient understands ongoing med management will take place once established with outpatient services at [facility].   Molli Barrows, NP 06/16/2022, 9:34 AM

## 2022-06-16 NOTE — Progress Notes (Signed)
   06/16/22 0801  [place] Triage Screening (Walk-ins at [facility] only)  What Is the Reason for Your Visit/Call Today? Pt is a 37 yo male who presents voluntaily to [facility] due to seeking medications for his anxiety and depression. Pt reported that he is having dificulty sleeping. Pt reported that he has not had any medications since September 2023. Pt denies SI/HI and AVH. Pt denies having a therpaist and psychiatrist at this time.  How Long Has This Been Causing You Problems? 1-6 months  Have You Recently Had Any Thoughts About Hurting Yourself? No  Are You Planning to Commit Suicide/Harm Yourself At This time? No  Have you Recently Had Thoughts About [place]? No  Are You Planning To Harm Someone At This Time? No  Are you currently experiencing any auditory, visual or other hallucinations? No  Have You Used Any Alcohol or Drugs in the Past 24 Hours? Yes  How long ago did you use Drugs or Alcohol? Pt reports using alcohol daily. Pt reported using Methamphetamine ( Meth) once a week.  What Did You Use and How Much? ETOH: 4-5 beers  Do you have any current medical co-morbidities that require immediate attention? No  Clinician description of patient physical appearance/behavior: Pt was calm, alert, oriented x4 with normal speech. Pt's eye contact is good. Pt's mood is depressed. Pt is not currently responding to internal stimuli or experiencing delusional thought content. Pt was cooperative throughout assessement.  What Do You Feel Would Help You the Most Today? Treatment for Depression or other mood problem;Medication(s)  If access to [facility] was not available, would you have sought care in the Emergency Department? Yes  Determination of Need Routine (7 days)  Options For Referral Medication Management;Outpatient Therapy    Flowsheet Row ED from 06/16/2022 in [facility] Most recent reading at 06/16/2022  8:23 AM ED from 06/16/2022 in [facility] Emergency  Department at [facility] Most recent reading at 06/16/2022  3:09 AM ED from 05/01/2022 in [facility] Emergency Department at [facility] Most recent reading at 05/01/2022  3:26 AM  C-SSRS RISK CATEGORY No Risk No Risk Low Risk

## 2022-06-16 NOTE — ED Provider Notes (Signed)
Behavioral Health Urgent Care Medical Screening Exam  Patient Name: Ricky Wu MRN: [id number] Date of Evaluation: 06/17/22 Chief Complaint:  increase panic attack Diagnosis:  Final diagnoses:  Homelessness  Malingering  Panic attack    History of Present illness: Ricky Wu is a 37 y.o. male.  With a history of panic attacks, homelessness, anxiety, polysubstance use presented to [facility] via [facility] voluntarily.  Per the patient he is having increased panic attacks, and sometimes he feels like somebody is watching him and and after him.  When asked when does this thoughts patient stated this been going on for years.  Patient was last seen in walk-in psychiatry and prescribed medication however patient said he does not fill the prescription.  Patient reports he is homeless at this time, when asked if he had tried to shelters patient stated no.  Face-to-face observation of patient, patient is alert and oriented x 4, speech is clear, maintaining eye contact.  Patient denies SI, HI, patient does report some paranoia.  Patient reports that he used methamphetamines and alcohol prior to coming in when asked how he used meth he said he screwed it up.  Patient reports increased anxiety and panic attacks according to patient normally when he comes here and they would give him Ativan.  Discussed with patient that he will be possible to get Ativan at this moment however I will give patient hydroxyzine 25 mg.  Patient does seem to be malingering.  Patient does not seem to be influenced by external or internal stimuli.  Recommend discharge for patient to follow-up with outpatient psychiatry or outpatient services and resources were provided to patient..  [place] ED from 06/16/2022 in [facility] Most recent reading at 06/16/2022 10:29 PM ED from 06/16/2022 in [facility] Most recent reading at 06/16/2022  8:23 AM ED from 06/16/2022 in [facility]  Emergency Department at [facility] Most recent reading at 06/16/2022  3:09 AM  C-SSRS RISK CATEGORY No Risk No Risk No Risk       Psychiatric Specialty Exam  Presentation  General Appearance:Casual  Eye Contact:Fair  Speech:Clear and Coherent  Speech Volume:Normal  Handedness:Right   Mood and Affect  Mood: Anxious  Affect: Appropriate   Thought Process  Thought Processes: Coherent  Descriptions of Associations:Circumstantial  Orientation:Full (Time, Place and Person)  Thought Content:WDL    Hallucinations:None  Ideas of Reference:None  Suicidal Thoughts:No  Homicidal Thoughts:No   Sensorium  Memory: Immediate Fair  Judgment: Poor  Insight: Fair   Community education officer  Concentration: Good  Attention Span: Good  Recall: Good  Fund of Knowledge: Good  Language: Good   Psychomotor Activity  Psychomotor Activity: Normal   Assets  Assets: Desire for Improvement; Housing   Sleep  Sleep: Fair  Number of hours: No data recorded  Physical Exam: Physical Exam HENT:     Head: Normocephalic.     Nose: Nose normal.  Cardiovascular:     Rate and Rhythm: Tachycardia present.  Pulmonary:     Effort: Pulmonary effort is normal.  Musculoskeletal:        General: Normal range of motion.     Cervical back: Normal range of motion.  Neurological:     General: No focal deficit present.     Mental Status: He is alert.  Psychiatric:        Mood and Affect: Mood normal.        Behavior: Behavior normal.  Thought Content: Thought content normal.        Judgment: Judgment normal.    Review of Systems  Constitutional: Negative.   HENT: Negative.    Eyes: Negative.   Respiratory: Negative.    Cardiovascular: Negative.   Gastrointestinal: Negative.   Genitourinary: Negative.   Musculoskeletal: Negative.   Skin: Negative.   Neurological: Negative.   Psychiatric/Behavioral:  Positive for substance abuse. The  patient is nervous/anxious.    Blood pressure (!) 145/119, pulse (!) 104, temperature 98.3 F (36.8 C), temperature source Oral, resp. rate 20, SpO2 100 %. There is no height or weight on file to calculate BMI.  Musculoskeletal: Strength & Muscle Tone: within normal limits Gait & Station: normal Patient leans: N/A   [place] MSE Discharge Disposition for Follow up and Recommendations: Based on my evaluation the patient does not appear to have an emergency medical condition and can be discharged with resources and follow up care in outpatient services for Medication Management   Evette Georges, NP 06/17/2022, 6:02 AM

## 2022-06-16 NOTE — ED Triage Notes (Signed)
Patient states he injured his right hand and left rib.  He was seen here and had xrays 02/26.  Patient states he was just recently released from jail.  He states he is needing something for headache, a brace for his hand, and something for anxiety.  He is currently homeless.

## 2022-06-16 NOTE — Discharge Instructions (Signed)
The x-rays of your hand are unchanged.  Maintain splint and follow-up with the provider who is on-call when you initially presented.  Phone number provided.

## 2022-06-16 NOTE — ED Notes (Addendum)
Pt ambulatory to nurse station stating he needed "to leave before his discharge."  This RN notified pt that the provider had ordered a splint for his arm and then we could officially discharge him.  This RN offered to go ahead and grab splint for pt so he could be discharged.  Pt politely declined, stating he needed to go ahead and leave but just wanted to notify staff so that we "did not keep working his orders and stuff."  This RN directed pt to ED lobby for discharge.  Pt ambulatory with steady gait and leaving department in stable condition with all belongings.  [place] MD notified that pt left prior to splint and discharge paperwork.

## 2022-06-23 ENCOUNTER — Emergency Department (HOSPITAL_COMMUNITY)
Admission: EM | Admit: 2022-06-23 | Discharge: 2022-06-23 | Disposition: A | Discharge status: Home / Self Care | Payer: Medicaid Other | Attending: Emergency Medicine | Admitting: Emergency Medicine

## 2022-06-23 ENCOUNTER — Other Ambulatory Visit: Payer: Self-pay

## 2022-06-23 ENCOUNTER — Encounter (HOSPITAL_COMMUNITY): Payer: Self-pay

## 2022-06-23 DIAGNOSIS — Z5900 Homelessness unspecified: Secondary | ICD-10-CM | POA: Insufficient documentation

## 2022-06-23 DIAGNOSIS — F419 Anxiety disorder, unspecified: Secondary | ICD-10-CM | POA: Diagnosis present

## 2022-06-23 MED ORDER — SERTRALINE HCL 50 MG PO TABS: 50.0000 mg | ORAL_TABLET | Freq: Every day | ORAL | 0 refills | Status: DC

## 2022-06-23 MED ORDER — GABAPENTIN 300 MG PO CAPS: 300.0000 mg | ORAL_CAPSULE | Freq: Three times a day (TID) | ORAL | 0 refills | Status: DC

## 2022-06-23 MED ORDER — LORAZEPAM 1 MG PO TABS
1.0000 mg | ORAL_TABLET | Freq: Once | ORAL | Status: AC
  Administered 2022-06-23: 1 mg via ORAL
  Filled 2022-06-23: qty 1

## 2022-06-23 NOTE — ED Provider Notes (Signed)
[facility] Provider Note   CSN: [id number] Arrival date & time: 06/23/22  0555     History  Chief Complaint  Patient presents with   Anxiety    Ricky Wu is a 37 y.o. male.  Patient complains of anxiety.  Patient reports that he is homeless.  Patient states that things got really rough on the streets of [place] last night.  Patient reports he became very anxious, he has a history of anxiety.  Patient reports that he is taking his Zoloft or gabapentin.  Was recently seen at behavioral health and they were prescribed but he is unable to get from the pharmacy where they prescribed him.  Patient is asking for a paper copy of his prescriptions  The history is provided by the patient. No language interpreter was used.  Anxiety This is a recurrent problem. The problem has not changed since onset.Pertinent negatives include no chest pain, no abdominal pain, no headaches and no shortness of breath. Nothing aggravates the symptoms. Nothing relieves the symptoms.       Home Medications Prior to Admission medications   Medication Sig Start Date End Date Taking? Authorizing Provider  gabapentin (NEURONTIN) 300 MG capsule Take 1 capsule (300 mg total) by mouth 3 (three) times daily for 7 days. 06/23/22 06/30/22  Fransico Meadow, PA-C  sertraline (ZOLOFT) 50 MG tablet Take 1 tablet (50 mg total) by mouth daily for 7 days. 06/23/22 06/30/22  Fransico Meadow, PA-C      Allergies    Patient has no known allergies.    Review of Systems   Review of Systems  Respiratory:  Negative for shortness of breath.   Cardiovascular:  Negative for chest pain.  Gastrointestinal:  Negative for abdominal pain.  Neurological:  Negative for headaches.  All other systems reviewed and are negative.   Physical Exam Updated Vital Signs BP (!) 144/99   Pulse 97   Temp 97.9 F (36.6 C)   Resp 16   SpO2 100%  Physical Exam Vitals and nursing note reviewed.   Constitutional:      Appearance: He is well-developed.  HENT:     Head: Normocephalic.     Mouth/Throat:     Mouth: Mucous membranes are moist.  Cardiovascular:     Rate and Rhythm: Normal rate.  Pulmonary:     Effort: Pulmonary effort is normal.  Abdominal:     General: There is no distension.  Musculoskeletal:        General: Normal range of motion.  Skin:    General: Skin is warm.  Neurological:     General: No focal deficit present.     Mental Status: He is alert and oriented to person, place, and time.     ED Results / Procedures / Treatments   Labs (all labs ordered are listed, but only abnormal results are displayed) Labs Reviewed - No data to display  EKG None  Radiology No results found.  Procedures Procedures    Medications Ordered in ED Medications  LORazepam (ATIVAN) tablet 1 mg (1 mg Oral Given 06/23/22 0725)    ED Course/ Medical Decision Making/ A&P                             Medical Decision Making Patient complains of anxiety  Amount and/or Complexity of Data Reviewed External Data Reviewed: notes.    Details: [facility] notes reviewed  Risk Prescription drug management. Risk Details: Patient is given a paper prescription for Neurontin and Zoloft as he is requesting this. Reports feeling better after Ativan.  Patient discharged in stable condition           Final Clinical Impression(s) / ED Diagnoses Final diagnoses:  Anxiety    Rx / DC Orders ED Discharge Orders          Ordered    gabapentin (NEURONTIN) 300 MG capsule  3 times daily        06/23/22 1003    sertraline (ZOLOFT) 50 MG tablet  Daily        06/23/22 1003           An After Visit Summary was printed and given to the patient.    Fransico Meadow, Hershal Coria 06/23/22 1015    Cristie Hem, MD 06/24/22 [phone]

## 2022-06-23 NOTE — ED Triage Notes (Signed)
Pt reports increased anxiety this morning and reports using meth and ETOH yesterday. Pt denies SI.

## 2022-07-26 ENCOUNTER — Emergency Department (HOSPITAL_COMMUNITY): Payer: Commercial Managed Care - HMO

## 2022-07-26 ENCOUNTER — Encounter: Payer: Self-pay | Admitting: Physician Assistant

## 2022-07-26 ENCOUNTER — Emergency Department (HOSPITAL_COMMUNITY)
Admission: EM | Admit: 2022-07-26 | Discharge: 2022-07-26 | Disposition: A | Discharge status: Home / Self Care | Payer: Commercial Managed Care - HMO | Attending: Emergency Medicine | Admitting: Emergency Medicine

## 2022-07-26 ENCOUNTER — Ambulatory Visit: Payer: Medicaid Other | Admitting: Physician Assistant

## 2022-07-26 VITALS — BP 125/79 | HR 94 | Ht 72.0 in | Wt 189.0 lb

## 2022-07-26 DIAGNOSIS — N179 Acute kidney failure, unspecified: Secondary | ICD-10-CM | POA: Diagnosis not present

## 2022-07-26 DIAGNOSIS — F411 Generalized anxiety disorder: Secondary | ICD-10-CM

## 2022-07-26 DIAGNOSIS — F1510 Other stimulant abuse, uncomplicated: Secondary | ICD-10-CM

## 2022-07-26 DIAGNOSIS — D72829 Elevated white blood cell count, unspecified: Secondary | ICD-10-CM | POA: Insufficient documentation

## 2022-07-26 DIAGNOSIS — F1010 Alcohol abuse, uncomplicated: Secondary | ICD-10-CM | POA: Diagnosis not present

## 2022-07-26 DIAGNOSIS — E162 Hypoglycemia, unspecified: Secondary | ICD-10-CM | POA: Insufficient documentation

## 2022-07-26 DIAGNOSIS — I10 Essential (primary) hypertension: Secondary | ICD-10-CM | POA: Diagnosis not present

## 2022-07-26 DIAGNOSIS — F17210 Nicotine dependence, cigarettes, uncomplicated: Secondary | ICD-10-CM

## 2022-07-26 DIAGNOSIS — E860 Dehydration: Secondary | ICD-10-CM | POA: Diagnosis not present

## 2022-07-26 DIAGNOSIS — F419 Anxiety disorder, unspecified: Secondary | ICD-10-CM | POA: Insufficient documentation

## 2022-07-26 DIAGNOSIS — Y909 Presence of alcohol in blood, level not specified: Secondary | ICD-10-CM | POA: Diagnosis not present

## 2022-07-26 DIAGNOSIS — Z5900 Homelessness unspecified: Secondary | ICD-10-CM

## 2022-07-26 DIAGNOSIS — F333 Major depressive disorder, recurrent, severe with psychotic symptoms: Secondary | ICD-10-CM

## 2022-07-26 DIAGNOSIS — R002 Palpitations: Secondary | ICD-10-CM | POA: Diagnosis present

## 2022-07-26 LAB — BASIC METABOLIC PANEL
Anion gap: 13 (ref 5–15)
BUN: 17 mg/dL (ref 6–20)
CO2: 20 mmol/L — ABNORMAL LOW (ref 22–32)
Calcium: 8.5 mg/dL — ABNORMAL LOW (ref 8.9–10.3)
Chloride: 103 mmol/L (ref 98–111)
Creatinine, Ser: 1.43 mg/dL — ABNORMAL HIGH (ref 0.61–1.24)
GFR, Estimated: >60 mL/min (ref >60)
Glucose, Bld: 48 mg/dL — ABNORMAL LOW (ref 70–99)
Potassium: 4 mmol/L (ref 3.5–5.1)
Sodium: 136 mmol/L (ref 135–145)

## 2022-07-26 LAB — CBG MONITORING, ED
Glucose-Capillary: 35 mg/dL — CL (ref 70–99)
Glucose-Capillary: 36 mg/dL — CL (ref 70–99)
Glucose-Capillary: 154 mg/dL — ABNORMAL HIGH (ref 70–99)
Glucose-Capillary: 191 mg/dL — ABNORMAL HIGH (ref 70–99)
Glucose-Capillary: 138 mg/dL — ABNORMAL HIGH (ref 70–99)

## 2022-07-26 LAB — CBC
HCT: 40.9 % (ref 39.0–52.0)
Hemoglobin: 14 g/dL (ref 13.0–17.0)
MCH: 32.3 pg (ref 26.0–34.0)
MCHC: 34.2 g/dL (ref 30.0–36.0)
MCV: 94.5 fL (ref 80.0–100.0)
Platelets: 250 10*3/uL (ref 150–400)
RBC: 4.33 MIL/uL (ref 4.22–5.81)
RDW: 13.8 % (ref 11.5–15.5)
WBC: 18.6 10*3/uL — ABNORMAL HIGH (ref 4.0–10.5)
nRBC: 0 % (ref 0.0–0.2)

## 2022-07-26 MED ORDER — LORAZEPAM 1 MG PO TABS
1.0000 mg | ORAL_TABLET | Freq: Once | ORAL | Status: AC
  Administered 2022-07-26: 1 mg via ORAL
  Filled 2022-07-26: qty 1

## 2022-07-26 MED ORDER — GABAPENTIN 300 MG PO CAPS: 300.0000 mg | ORAL_CAPSULE | Freq: Three times a day (TID) | ORAL | 0 refills | Status: DC

## 2022-07-26 MED ORDER — SODIUM CHLORIDE 0.9 % IV BOLUS (SEPSIS)
1000.0000 mL | Freq: Once | INTRAVENOUS | Status: AC
  Administered 2022-07-26: 1000 mL via INTRAVENOUS

## 2022-07-26 MED ORDER — BUPROPION HCL ER (XL) 150 MG PO TB24
150.0000 mg | ORAL_TABLET | Freq: Every day | ORAL | 1 refills | Status: DC
Start: 2022-07-26 — End: 2022-08-13

## 2022-07-26 MED ORDER — SERTRALINE HCL 50 MG PO TABS
50.0000 mg | ORAL_TABLET | Freq: Every day | ORAL | 1 refills | Status: DC
Start: 2022-07-26 — End: 2022-08-13

## 2022-07-26 NOTE — Progress Notes (Unsigned)
Established Patient Office Visit  Subjective   Patient ID: Ricky Wu, male    DOB: [birth year]  Age: 37 y.o. MRN: [id number]  Chief Complaint  Patient presents with   ADHD    Hard time focusing, and remaining still.     States that he has not been taking his medication as prescribed for the past 3 to 4 months.  States that he has difficulty getting to his appointments.  States that he was previously diagnosed with attention deficit and requests refill of Adderall.  States that Adderall is the only thing that helps him avoid having to IV  States that he has history of panic attacks, states that he is "stuck in homeless lifestyle", states that he has been having thoughts that he would be better off dead, but adamantly denies any harm.  Does endorse that his regimen of gabapentin and Zoloft did offer relief from depressed moods thoughts he would be better off dead and did offer some relief from his anxiety.  States that he does not like taking the Zoloft because it causes erectile dysfunction.    Of note patient was at the emergency department earlier today.  Note from that visit: Patient history of ADHD, anxiety, substance use disorder presents for multiple complaints.  Patient reports he has felt anxious and having palpitations.  He reports feeling short of breath.  Denies any significant pain.  He does admit to excessive use of alcohol and injecting methamphetamine outside all day.  He reports very little oral intake.  He was brought in via EMS from a fast food parking lot.    This patient presents to the ED for concern of weakness and palpitations, this involves an extensive number of treatment options, and is a complaint that carries with it a high risk of complications and morbidity.  The differential diagnosis includes but is not limited to CVA, intracranial hemorrhage, acute coronary syndrome, renal failure, urinary tract infection, electrolyte disturbance, pneumonia      Comorbidities that complicate the patient evaluation: Patient's presentation is complicated by their history of anxiety   Social Determinants of Health: Patient's unhoused, impaired access to primary care, and substance use disorder   increases the complexity of managing their presentation   Additional history obtained: Records reviewed Care Everywhere/External Records   Lab Tests: I Ordered, and personally interpreted labs.  The pertinent results include: Hypoglycemia, leukocytosis   Imaging Studies ordered: I ordered imaging studies including X-ray chest   I independently visualized and interpreted imaging which showed no acute findings I agree with the radiologist interpretation   Cardiac Monitoring: The patient was maintained on a cardiac monitor.  I personally viewed and interpreted the cardiac monitor which showed an underlying rhythm of:  sinus rhythm   Medicines ordered and prescription drug management: I ordered medication including IV fluids  for dehydration  Reevaluation of the patient after these medicines showed that the patient    improved   Critical Interventions:   IV fluids, oral glucose   Reevaluation: After the interventions noted above, I reevaluated the patient and found that they have :improved   Complexity of problems addressed: Patient's presentation is most consistent with  acute presentation with potential threat to life or bodily function   Disposition: After consideration of the diagnostic results and the patient's response to treatment,  I feel that the patent would benefit from discharge     Past Medical History:  Diagnosis Date   ADHD (attention deficit hyperactivity disorder)  Anhedonia    Anxiety    Anxiety disorder    Confusion    Depression    Disorganized thought process    GERD (gastroesophageal reflux disease)    Hypertension    Penis disorder    Sleep apnea    mild no cpap or bipap   Social History   Socioeconomic  History   Marital status: Single    Spouse name: Not on file   Number of children: 0   Years of education: 13   Highest education level: Not on file  Occupational History    Comment: Does Not Work  Tobacco Use   Smoking status: Every Day    Packs/day: 1.50    Years: 14.00    Additional pack years: 0.00    Total pack years: 21.00    Types: Cigarettes   Smokeless tobacco: Never  Vaping Use   Vaping Use: Some days  Substance and Sexual Activity   Alcohol use: Yes    Comment: 3-4 tall beers per day   Drug use: Yes    Types: Methamphetamines   Sexual activity: Not Currently  Other Topics Concern   Not on file  Social History Narrative   Patient lives at home home alone sharing with his aunt and uncle. Patient has some college. Patient  is not working at this time. education.   Left handed.   Caffeine- two cokes.   Social Determinants of Health   Financial Resource Strain: Not on file  Food Insecurity: Not on file  Transportation Needs: Not on file  Physical Activity: Not on file  Stress: Not on file  Social Connections: Not on file  Intimate Partner Violence: Not on file   Family History  Problem Relation Age of Onset   Hypertension Mother    Diabetes Mother    Cancer Father    Arthritis Father    Diabetes Other    No Known Allergies  Review of Systems  Constitutional: Negative.   HENT: Negative.    Eyes: Negative.   Respiratory:  Negative for shortness of breath.   Cardiovascular:  Negative for chest pain.  Gastrointestinal: Negative.   Genitourinary: Negative.   Musculoskeletal: Negative.   Skin: Negative.   Neurological: Negative.   Endo/Heme/Allergies: Negative.   Psychiatric/Behavioral:  Positive for depression. The patient is nervous/anxious and has insomnia.       Objective:     BP 125/79 (BP Location: Left Arm, Patient Position: Sitting, Cuff Size: Large)   Pulse 94   Ht 6' (1.829 m)   Wt 189 lb (85.7 kg)   SpO2 97%   BMI 25.63 kg/m     Physical Exam Vitals and nursing note reviewed.  Constitutional:      Appearance: Normal appearance.  HENT:     Head: Normocephalic and atraumatic.     Right Ear: External ear normal.     Left Ear: External ear normal.     Nose: Nose normal.     Mouth/Throat:     Mouth: Mucous membranes are moist.     Pharynx: Oropharynx is clear.  Eyes:     Extraocular Movements: Extraocular movements intact.     Conjunctiva/sclera: Conjunctivae normal.     Pupils: Pupils are equal, round, and reactive to light.  Cardiovascular:     Rate and Rhythm: Normal rate and regular rhythm.     Pulses: Normal pulses.     Heart sounds: Normal heart sounds.  Pulmonary:     Effort: Pulmonary effort is normal.  Breath sounds: Normal breath sounds.  Musculoskeletal:        General: Normal range of motion.     Cervical back: Normal range of motion and neck supple.  Skin:    General: Skin is warm and dry.  Neurological:     General: No focal deficit present.     Mental Status: He is alert and oriented to person, place, and time.  Psychiatric:        Mood and Affect: Mood normal.        Behavior: Behavior normal.        Thought Content: Thought content normal.        Judgment: Judgment normal.         Assessment & Plan:   Problem List Items Addressed This Visit       Other   GAD (generalized anxiety disorder) - Primary (Chronic)   Relevant Medications   sertraline (ZOLOFT) 50 MG tablet   gabapentin (NEURONTIN) 300 MG capsule   buPROPion (WELLBUTRIN XL) 150 MG 24 hr tablet   Other Relevant Orders   Ambulatory referral to Psychiatry   Alcohol abuse   Other Visit Diagnoses     Severe episode of recurrent major depressive disorder, with psychotic features (HCC)       Relevant Medications   sertraline (ZOLOFT) 50 MG tablet   buPROPion (WELLBUTRIN XL) 150 MG 24 hr tablet      1. GAD (generalized anxiety disorder) Patient agreeable to restart Zoloft and gabapentin.  Patient given  trial Wellbutrin patient agreeable to referral to psychiatry patient management.  Patient encouraged to return to mobile unit phone, okay to contact brother due to patient not having working phone please contact brother Parrish Bonn [phone] for appointment  - Ambulatory referral to Psychiatry - sertraline (ZOLOFT) 50 MG tablet; Take 1 tablet (50 mg total) by mouth daily.  Dispense: 30 tablet; Refill: 1 - gabapentin (NEURONTIN) 300 MG capsule; Take 1 capsule (300 mg total) by mouth 3 (three) times daily.  Dispense: 90 capsule; Refill: 0 - buPROPion (WELLBUTRIN XL) 150 MG 24 hr tablet; Take 1 tablet (150 mg total) by mouth daily.  Dispense: 30 tablet; Refill: 1  2. Severe episode of recurrent major depressive disorder, with psychotic features (HCC)   3. Alcohol abuse Patient strongly encouraged to reach out to [facility] residential treatment center for substance abuse treatment.  Information given  4. Methamphetamine abuse (HCC)   5. Homelessness    I have reviewed the patient's medical history (PMH, PSH, Social History, Family History, Medications, and allergies) , and have been updated if relevant. I spent 30 minutes reviewing chart and  face to face time with patient.    Return in about 2 weeks (around 08/09/2022) for with [facility].    Kasandra Knudsen Mayers, PA-C

## 2022-07-26 NOTE — ED Provider Notes (Signed)
[facility] EMERGENCY DEPARTMENT AT [facility] Provider Note   CSN: [id number] Arrival date & time: 07/26/22  0246     History  Chief Complaint  Patient presents with   Hypoglycemia   Anxiety   Alcohol Intoxication    Ricky Wu is a 37 y.o. male.  The history is provided by the patient.  Patient history of ADHD, anxiety, substance use disorder presents for multiple complaints.  Patient reports he has felt anxious and having palpitations.  He reports feeling short of breath.  Denies any significant pain.  He does admit to excessive use of alcohol and injecting methamphetamine outside all day.  He reports very little oral intake.  He was brought in via EMS from a fast food parking lot.    Past Medical History:  Diagnosis Date   ADHD (attention deficit hyperactivity disorder)    Anhedonia    Anxiety    Anxiety disorder    Confusion    Depression    Disorganized thought process    GERD (gastroesophageal reflux disease)    Hypertension    Penis disorder    Sleep apnea    mild no cpap or bipap    Home Medications Prior to Admission medications   Medication Sig Start Date End Date Taking? Authorizing Provider  gabapentin (NEURONTIN) 300 MG capsule Take 1 capsule (300 mg total) by mouth 3 (three) times daily for 7 days. 06/23/22 06/30/22  Elson Areas, PA-C  sertraline (ZOLOFT) 50 MG tablet Take 1 tablet (50 mg total) by mouth daily for 7 days. 06/23/22 06/30/22  Elson Areas, PA-C      Allergies    Patient has no known allergies.    Review of Systems   Review of Systems  Constitutional:  Negative for fever.  Cardiovascular:  Positive for palpitations.  Psychiatric/Behavioral:  Negative for suicidal ideas. The patient is nervous/anxious.     Physical Exam Updated Vital Signs BP 113/71   Pulse 98   Temp (!) 97.4 F (36.3 C) (Oral)   Resp 20   SpO2 100%  Physical Exam CONSTITUTIONAL: Disheveled, no acute distress HEAD:  Normocephalic/atraumatic EYES: EOMI/PERRL ENMT: Mucous membranes moist NECK: supple no meningeal signs SPINE/BACK:entire spine nontender CV: S1/S2 noted, no murmurs/rubs/gallops noted LUNGS: Lungs are clear to auscultation bilaterally, no apparent distress ABDOMEN: soft, nontender GU:no cva tenderness NEURO: Pt is awake/alert/appropriate, moves all extremitiesx4.  No facial droop.  GCS 15 EXTREMITIES: pulses normal/equal, full ROM No wounds or abscesses noted extremities No obvious deformities SKIN: warm PSYCH: no abnormalities of mood noted, alert and oriented to situation  ED Results / Procedures / Treatments   Labs (all labs ordered are listed, but only abnormal results are displayed) Labs Reviewed  CBC - Abnormal; Notable for the following components:      Result Value   WBC 18.6 (*)    All other components within normal limits  BASIC METABOLIC PANEL - Abnormal; Notable for the following components:   CO2 20 (*)    Glucose, Bld 48 (*)    Creatinine, Ser 1.43 (*)    Calcium 8.5 (*)    All other components within normal limits  CBG MONITORING, ED - Abnormal; Notable for the following components:   Glucose-Capillary 35 (*)    All other components within normal limits  CBG MONITORING, ED - Abnormal; Notable for the following components:   Glucose-Capillary 36 (*)    All other components within normal limits  CBG MONITORING, ED - Abnormal; Notable  for the following components:   Glucose-Capillary 154 (*)    All other components within normal limits  CBG MONITORING, ED - Abnormal; Notable for the following components:   Glucose-Capillary 191 (*)    All other components within normal limits  CBG MONITORING, ED - Abnormal; Notable for the following components:   Glucose-Capillary 138 (*)    All other components within normal limits    EKG EKG Interpretation  Date/Time:  Tuesday July 26 2022 02:53:57 EDT Ventricular Rate:  102 PR Interval:  145 QRS Duration: 96 QT  Interval:  366 QTC Calculation: 477 R Axis:   22 Text Interpretation: Sinus tachycardia Borderline prolonged QT interval Artifact in lead(s) I III aVL Interpretation limited secondary to artifact Confirmed by Zadie Rhine ([id number]) on 07/26/2022 3:03:43 AM  Radiology DG Chest Port 1 View  Result Date: 07/26/2022 CLINICAL DATA:  Shortness of breath EXAM: PORTABLE CHEST 1 VIEW COMPARISON:  05/23/2022 FINDINGS: The heart size and mediastinal contours are within normal limits. Both lungs are clear. The visualized skeletal structures are unremarkable. IMPRESSION: No active disease. Electronically Signed   By: Alcide Clever M.D.   On: 07/26/2022 03:14    Procedures .Critical Care  Performed by: Zadie Rhine, MD Authorized by: Zadie Rhine, MD   Critical care provider statement:    Critical care time (minutes):  45   Critical care start time:  07/26/2022 3:00 AM   Critical care end time:  07/26/2022 3:45 AM   Critical care time was exclusive of:  Separately billable procedures and treating other patients   Critical care was necessary to treat or prevent imminent or life-threatening deterioration of the following conditions:  Toxidrome, dehydration and metabolic crisis   Critical care was time spent personally by me on the following activities:  Examination of patient, ordering and review of laboratory studies, ordering and review of radiographic studies, re-evaluation of patient's condition, ordering and performing treatments and interventions, development of treatment plan with patient or surrogate, pulse oximetry and evaluation of patient's response to treatment   I assumed direction of critical care for this patient from another provider in my specialty: no       Medications Ordered in ED Medications  LORazepam (ATIVAN) tablet 1 mg (has no administration in time range)  sodium chloride 0.9 % bolus 1,000 mL (0 mLs Intravenous Stopped 07/26/22 0337)  sodium chloride 0.9 % bolus 1,000 mL  (0 mLs Intravenous Stopped 07/26/22 0429)    ED Course/ Medical Decision Making/ A&P Clinical Course as of 07/26/22 0615  Tue Jul 26, 2022  0303 Glucose-Capillary(!!): 35 Hypoglycemia [DW]  0320 Patient is an unhoused individual presents for his 18th ER visit in 6 months.  Patient admits to alcohol and substance use throughout the day with very little intake.  He reports feeling anxious and having palpitations.  Patient's glucose has dropped to 35 though he is awake and alert.  He has been given oral fluids to drink [DW]  0337 Creatinine(!): 1.43 Acute kidney injury [DW]  0337 CO2(!): 20 Dehydration [DW]  0431 Glucose-Capillary(!): 154 Glucose is improving [DW]  0507 Patient continues to improve [DW]  6045 Patient monitored for several hours and is improved.  He has had consistent glucose monitoring as above 120.  He is taking oral fluids.  He does have mild anxiety, will order 1 dose of Ativan.  Outpatient resources have been given to the patient. [DW]    Clinical Course User Index [DW] Zadie Rhine, MD  Medical Decision Making Amount and/or Complexity of Data Reviewed Labs: ordered. Decision-making details documented in ED Course. Radiology: ordered.  Risk Prescription drug management.   This patient presents to the ED for concern of weakness and palpitations, this involves an extensive number of treatment options, and is a complaint that carries with it a high risk of complications and morbidity.  The differential diagnosis includes but is not limited to CVA, intracranial hemorrhage, acute coronary syndrome, renal failure, urinary tract infection, electrolyte disturbance, pneumonia    Comorbidities that complicate the patient evaluation: Patient's presentation is complicated by their history of anxiety  Social Determinants of Health: Patient's unhoused, impaired access to primary care, and substance use disorder   increases the complexity of  managing their presentation  Additional history obtained: Records reviewed Care Everywhere/External Records  Lab Tests: I Ordered, and personally interpreted labs.  The pertinent results include: Hypoglycemia, leukocytosis  Imaging Studies ordered: I ordered imaging studies including X-ray chest   I independently visualized and interpreted imaging which showed no acute findings I agree with the radiologist interpretation  Cardiac Monitoring: The patient was maintained on a cardiac monitor.  I personally viewed and interpreted the cardiac monitor which showed an underlying rhythm of:  sinus rhythm  Medicines ordered and prescription drug management: I ordered medication including IV fluids  for dehydration  Reevaluation of the patient after these medicines showed that the patient    improved  Critical Interventions:  IV fluids, oral glucose  Reevaluation: After the interventions noted above, I reevaluated the patient and found that they have :improved  Complexity of problems addressed: Patient's presentation is most consistent with  acute presentation with potential threat to life or bodily function  Disposition: After consideration of the diagnostic results and the patient's response to treatment,  I feel that the patent would benefit from discharge   .           Final Clinical Impression(s) / ED Diagnoses Final diagnoses:  Alcohol abuse  Anxiety  Hypoglycemia  Dehydration  AKI (acute kidney injury) [facility])    Rx / DC Orders ED Discharge Orders     None         Zadie Rhine, MD 07/26/22 [phone]

## 2022-07-26 NOTE — ED Notes (Signed)
Assumed care of homeless pt who arrived via ems c/o anxiety and nausea after admittedly drinking 12 beers and shooting meth all day in the hot sun. Pt reports not haven eaten for at least 4 days. Pt CBG 45 iv placed labs drawn OJ with added sugar given and fluids hung. Pt a/o x 4 sunburnt respirations even and non labored slightly tachycardic otherwise vs wnl

## 2022-07-26 NOTE — ED Triage Notes (Signed)
Pt to ED via EMS from [place] parking lot. Pt c/o anxiety and states he has hx of anxiety and panic disorder. Upon EMS arrival pt was hypoglycemic at 67 and hypotensive at 108/74. EMS gave 15g of oral glucose. Pt does not have hx of DM. Pt states he has not eaten "in a long time". Pt states he drank 7-8 beers today. Pt c/o bilateral feet pain from walking all day. Pt also endorses SOB. Pt also states he used meth today. Pt also c/o nasuea, EMS gave 4 of zofran en route and 500cc NS bolus.   EMS Vitals: 108/74 67 CBG 106/63 100 HR 99% RA 20 RR 18 LAC

## 2022-07-26 NOTE — Discharge Instructions (Signed)
Substance Abuse Treatment Programs ° °Intensive Outpatient Programs °[facility]     °[address]      °[place], [place]                   °[phone]      ° °[facility] °[address] °[place], [place] °[phone] ° °[facility]     °(Inpatient and outpatient)     °[address]           °[phone]   ° °[facility] °336-288-1484 (Suboxone and Methadone) ° °[address]      °[place], [place] [address]      °336-882-2125      ° °[address] °[place], [place] °852-3033 ° °[facility] (Outpatient/Inpatient, Chemical)    °(insurance only) [phone]      °       °[facility] (Groups & Residential) °[place], [place] °336-389-1413 ° °   °[facility]     °[address]     °[place], [place]      °[phone]      ° °[facility] (for caregivers and family) °[address] °[place], [place] °336-299-4655 ° ° ° ° ° °Residential Treatment Programs °[facility]      °[address], [place], [place] [address]  °([phone]      ° °[facility] °[address] [place], [place] [address] °[phone] ° °[facility]        °336-248-8914      ° °[facility] °1-800-659-3381 ° °[facility]             °[address]                                         °[place], [place]                                                °877-615-2722 or 336-784-9470                              ° °[facility] [place] °[address] °[place], [address] °1.877.941.8954 ° °[facility]    °[address]      °[place], [place]     °[phone]      ° °[facility] °[address] °[place], [place] °336-285-9073 ° °[facility]   °[address]     °[place], [place] [address]     °336-899-1550      °Admissions: 8am-3pm M-F ° °[facility] °[address] °[place],  [place] °336-227-7417 ° °[facility]: Residential Program (90 Days)   °[place], [place]      °336-725-8389 or 800-758-6077    ° °[facility] °[place], [place] °(Walk in Hours over the weekend or by referral) ° °[facility] °[address], [place],  [address] °(336) [phone] ° °Crisis Mobile: [facility]:  1-877-626-1772 (for crisis response 24 hours a day) °[facility] Hotline:      1-800-256-2452 °Outpatient Psychiatry and Counseling ° °[facility]: Mobile Crisis   Management 24 hours:  1-877-626-1772 ° °[facility] sliding scale fee and walk in schedule: M-F 8am-12pm/1pm-3pm °[address]  °[place], [place] [address] °[phone] ° °[facility] °[address] °[place], [place] [address] °336-703-9650 ° °[facility] (Formerly known [facility])- new patient walk-in appointments available Monday - Friday 8am -3pm.          °[address] °[place], [place] [address] °336-676-6840 or crisis line- 336-676-6905 ° °[facility] Intensive Outpatient Therapy Program °[address] °[place], [place] [address] °336-832-9804 ° °[facility]                  °Crisis Services      °336.641.4993      °[address]     °[place], [place] [address]                ° °[facility]   °[facility] °[phone] °[address] °[place], [place] [address] ° ° °[facility]          °[address]  °[place], [place] [address]       °([phone] ° °[facility] °[address] °[place], [place] [address] °336-292-1510 ° °[facility]    °[address]    °[place], [place] [address]     °336-632-3505      ° °Parish McKinney, [place]     °[address]     °[place] [address]     °336-282-1251     °  °[facility] °[address] °[place] [address] ° °[facility]     °[address]     °[place], [place]      °336-542-2076      ° °[facility] °Shamsher Ahluwalia, MD °[address] °[place], [place] [address] °336-420-9558 ° °[facility]     °[address]     °[place], [place] [address]     °336-274-1237      ° °[facility] °[address] °[place], [place] [address] °336-854-4450 °Resources for Temporary Residential Assistance/Crisis Centers ° °DAY CENTERS °[facility] °M-F 8am-3pm   °[address] [place], [place] [address]   336-332-0824 °Services include: laundry, barbering, support groups, case management, phone  & computer access, showers, AA/NA mtgs, mental health/substance abuse nurse, job skills class, disability information, VA assistance, spiritual classes, etc.  ° °HOMELESS SHELTERS ° °[facility]     °[facility]   °[address], [place]     °336.271.5959       °       °[facility] (women and children)       °[address] °, [place] [address] °336-275-0820 °[email or web address] for application and process °Application Required ° °[facility]   °[address]    °[place] [address]     °336.886.4922       °             °[facility] °[address] °, [place] [address] °336.273.5572 °336-235-0363(schedule application appt.) °Application Required ° °[facility] (women only)    °[address]     °[place],  [address]     °336-884-1039      °  Intake starts 6pm daily °Need valid ID, SSC, & Police report °[facility] [place] °[address] °[place], [place] °336-881-5420 °Application Required ° °[facility] (men only)     °[address]      °[place], [place]     °[phone]      ° °[facility] °(Pregnant women only) °[address] °[place], [place] °336-275-0206 ° °[facility]      °[address]      °[place], [place] [address]     °336-722-9951      °       °[facility] °[address] °[place], [place] °336-723-1848 °90 day commitment/SA/Application process ° °[facility](men only)     °[address]     °[place], [place]     °336-748-1962       °Check-in at 7pm     °       °[facility] °[address] °[place], [place] [address] °336-248-6684 °Men/Women/Women and Children must be there by 7 pm ° °[facility] °[place], [place] °336-722-8721                ° °

## 2022-07-26 NOTE — Patient Instructions (Addendum)
You are going to restart the Zoloft and the gabapentin.  You are also going to take the Wellbutrin once daily.  I started a referral for you to be seen by psychiatry for further evaluation of attention deficit.  I strongly encourage you to reach out to [facility] residential treatment center and consider inpatient substance abuse treatment.  [address] [place], [place] [address] Phone: [phone]  Please return to the mobile unit in 2 weeks for follow-up.  Roney Jaffe, PA-C Physician Assistant [facility] [email or web address]  Substance Use Disorder Substance use disorder occurs when a person's repeated use of drugs or alcohol interferes with the ability to be productive. This disorder can cause problems with mental and physical health. It can affect your ability to have healthy relationships, and it can keep you from being able to meet your responsibilities at work, home, or school. It can also lead to addiction, which is a condition in which you cannot stop using the substance consistently for a period of time. Addiction changes the way the brain works. Because of these changes, addiction is a chronic condition. Substance use disorder can be mild, moderate, or severe. Some commonly misused substances that can lead to this disorder include: Alcohol. Tobacco. Marijuana. Stimulants, such as cocaine and methamphetamine. Hallucinogens, such as LSD and PCP. Opioids, such as some prescription pain medicines and heroin. What are the causes? This condition may develop due to many complex social, psychological, or physical reasons, such as: Stress. Abuse. Peer pressure. Anxiety or depression. What increases the risk? This condition is more likely to develop in people who: Use substances to cope with stress. Have been abused. Have a mental health disorder, such as depression. Have a family history of substance use  disorder. What are the signs or symptoms? Symptoms of this condition include: Using the substance for longer periods of time or at a higher dosage than what is normal or intended. Having a lasting desire to use the substance. Being unable to slow down or stop the use of the substance. Spending an abnormal amount of time getting the substance, using the substance, or recovering from using the substance. Using the substance in a way that interferes with work, school, social activities, and personal relationships. Using the substance even after having negative consequences, such as: Health problems. Legal or financial troubles. Job loss. Relationship problems. Needing more and more of the substance to get the same effect (developing tolerance). Experiencing unpleasant symptoms if you do not use the substance (withdrawal). Using the substance to avoid withdrawal symptoms. How is this diagnosed? This condition may be diagnosed based on: A physical exam. Your history of substance use. Your symptoms. This includes: How substance use affects your life. Changes in personality, behaviors, and mood. Having at least two symptoms of substance use disorder within a 30-month period. Health issues related to substance use, such as liver damage, shortness of breath, fatigue, cough, or heart problems. Blood or urine tests to screen for alcohol and drugs. How is this treated? This condition may be treated by: Stopping substance use safely. This may require taking medicines and being closely monitored for several days. Taking part in group and individual counseling from mental health providers who help people with substance use disorder. Staying at a live-in (residential) treatment center for several days or weeks. Attending daily counseling sessions at a treatment center. Taking medicine as told by your health care provider: To ease symptoms and prevent complications during withdrawal. To treat other  mental health issues, such as depression or anxiety. To block cravings by causing the same effects as the substance. To block the effects of the substance or replace good sensations with unpleasant ones. Participating in a support group to share your experience with others who are going through the same thing. These groups are an important part of long-term recovery for many people. Recovery can be a long process. Many people who undergo treatment start using the substance again after stopping (relapse). If you relapse, that does not mean that treatment will not work. Follow these instructions at home:  Take over-the-counter and prescription medicines only as told by your health care provider. Do not use any drugs or alcohol. Avoid temptations or triggers that you associate with your use of the substance. Learn and practice techniques for managing stress. Have a plan for vulnerable moments. Get phone numbers of people who are willing to help and who are committed to your recovery. Attend support groups on a regular basis. These groups include 12-step programs like Alcoholics Anonymous and Narcotics Anonymous. Keep all follow-up visits. This is important. This includes continuing to work with therapists and support groups. Where to find more information Substance Abuse and Mental Health Services Administration [facility]): SkateOasis.com.pt National Alliance on Mental Illness (NAMI): www.nami.org Contact a health care provider if: You cannot take your medicines as told. Your symptoms get worse. You have trouble resisting the urge to use drugs or alcohol. Get help right away if: You relapse. You think that you may have taken too much of a drug. The [facility] hotline is 331 672 4682. You have signs of an overdose. Symptoms include: Chest pain. Confusion. Sleepiness or difficulty staying awake. Slowed breathing. Nausea or vomiting. A seizure. You have serious thoughts  about hurting yourself or someone else. Drug overdose is an emergency. Do not wait to see if the symptoms will go away. Get medical help right away. Call your local emergency services (911 in the [place].S[place] Do not drive yourself to the hospital. If you ever feel like you may hurt yourself or others, or have thoughts about taking your own life, get help right away. Go to your nearest emergency department or: Call your local emergency services (911 in the [place].S[place] Call a suicide crisis helpline, such as the National Suicide Prevention Lifeline at 902-065-1509 or 988 in the U.S. This is open 24 hours a day in the [place].S. Text the Crisis Text Line at 606-150-7415 (in the U.S.). Summary Substance use disorder occurs when a person's repeated use of drugs or alcohol interferes with the ability to be productive. Taking part in group and individual counseling from mental health providers is a common treatment for people with substance use disorder. Recovery can be a long process. Many people who undergo treatment start using the substance again after stopping (relapse). A relapse does not mean that treatment will not work. Attend support groups such as Alcoholics Anonymous and Narcotics Anonymous. These groups are an important part of long-term recovery for many people. This information is not intended to replace advice given to you by your health care provider. Make sure you discuss any questions you have with your health care provider. Document Revised: 10/07/2020 Document Reviewed: 07/10/2020 Elsevier Patient Education  2023 [employer].

## 2022-07-26 NOTE — ED Notes (Signed)
Pt provided with blue paper scrubs  and sack lunch to keep sugar stable today

## 2022-07-26 NOTE — ED Notes (Signed)
Pt given OJ with sugar, crackers, and graham crackers.

## 2022-07-27 ENCOUNTER — Encounter: Payer: Self-pay | Admitting: Physician Assistant

## 2022-07-27 DIAGNOSIS — F1510 Other stimulant abuse, uncomplicated: Secondary | ICD-10-CM | POA: Insufficient documentation

## 2022-07-27 DIAGNOSIS — F333 Major depressive disorder, recurrent, severe with psychotic symptoms: Secondary | ICD-10-CM | POA: Insufficient documentation

## 2022-07-31 ENCOUNTER — Encounter (HOSPITAL_COMMUNITY): Payer: Self-pay | Admitting: Emergency Medicine

## 2022-07-31 ENCOUNTER — Other Ambulatory Visit: Payer: Self-pay

## 2022-07-31 ENCOUNTER — Emergency Department (HOSPITAL_COMMUNITY)
Admission: EM | Admit: 2022-07-31 | Discharge: 2022-07-31 | Discharge status: Against Medical Advice | Payer: Commercial Managed Care - HMO | Attending: Emergency Medicine | Admitting: Emergency Medicine

## 2022-07-31 DIAGNOSIS — M79671 Pain in right foot: Secondary | ICD-10-CM | POA: Insufficient documentation

## 2022-07-31 DIAGNOSIS — M79672 Pain in left foot: Secondary | ICD-10-CM | POA: Insufficient documentation

## 2022-07-31 DIAGNOSIS — Z5329 Procedure and treatment not carried out because of patient's decision for other reasons: Secondary | ICD-10-CM | POA: Insufficient documentation

## 2022-07-31 DIAGNOSIS — F419 Anxiety disorder, unspecified: Secondary | ICD-10-CM | POA: Diagnosis present

## 2022-07-31 DIAGNOSIS — Z5900 Homelessness unspecified: Secondary | ICD-10-CM | POA: Diagnosis not present

## 2022-07-31 MED ORDER — LORAZEPAM 1 MG PO TABS
1.0000 mg | ORAL_TABLET | Freq: Once | ORAL | Status: AC
  Administered 2022-07-31: 1 mg via ORAL
  Filled 2022-07-31: qty 1

## 2022-07-31 NOTE — ED Triage Notes (Signed)
Pt bib [facility] c/o bilateral foot pain after walking throughout the night outside due to increased anxiety.

## 2022-07-31 NOTE — ED Provider Notes (Signed)
[place] EMERGENCY DEPARTMENT AT [facility] Provider Note   CSN: [id number] Arrival date & time: 07/31/22  5784     History  Chief Complaint  Patient presents with   Foot Pain    Ricky Wu is a 37 y.o. male.  Pt complains of anxiety.  Pt reports he has problems with anxiety.  Pt reports becoming very wet and cold.  Pt is homeless.  Pt worried about his feet.  Pt has a history of trench foot.    The history is provided by the patient. No language interpreter was used.  Foot Pain This is a new problem. The problem occurs constantly. Nothing relieves the symptoms. He has tried nothing for the symptoms.       Home Medications Prior to Admission medications   Medication Sig Start Date End Date Taking? Authorizing Provider  buPROPion (WELLBUTRIN XL) 150 MG 24 hr tablet Take 1 tablet (150 mg total) by mouth daily. 07/26/22   Mayers, Cari S, PA-C  gabapentin (NEURONTIN) 300 MG capsule Take 1 capsule (300 mg total) by mouth 3 (three) times daily. 07/26/22   Mayers, Cari S, PA-C  sertraline (ZOLOFT) 50 MG tablet Take 1 tablet (50 mg total) by mouth daily. 07/26/22   Mayers, Cari S, PA-C      Allergies    Patient has no known allergies.    Review of Systems   Review of Systems  All other systems reviewed and are negative.   Physical Exam Updated Vital Signs BP (!) 158/111 (BP Location: Right Arm)   Pulse 95   Temp 97.7 F (36.5 C)   Resp 16   Ht 6' (1.829 m)   Wt 86.2 kg   SpO2 100%   BMI 25.77 kg/m  Physical Exam Vitals and nursing note reviewed.  Constitutional:      Appearance: He is well-developed.  HENT:     Head: Normocephalic.  Cardiovascular:     Rate and Rhythm: Normal rate.  Pulmonary:     Effort: Pulmonary effort is normal.  Abdominal:     General: There is no distension.  Musculoskeletal:        General: Normal range of motion.     Cervical back: Normal range of motion.     Comments: Bilat feet wet    Skin:    General: Skin is  warm.  Neurological:     General: No focal deficit present.     Mental Status: He is alert and oriented to person, place, and time.  Psychiatric:        Mood and Affect: Mood normal.     ED Results / Procedures / Treatments   Labs (all labs ordered are listed, but only abnormal results are displayed) Labs Reviewed - No data to display  EKG None  Radiology No results found.  Procedures Procedures    Medications Ordered in ED Medications  LORazepam (ATIVAN) tablet 1 mg (1 mg Oral Given 07/31/22 6962)    ED Course/ Medical Decision Making/ A&P                             Medical Decision Making Risk Prescription drug management.           Final Clinical Impression(s) / ED Diagnoses Final diagnoses:  Anxiety    Rx / DC Orders ED Discharge Orders     None      An After Visit Summary was printed and  given to the patient.    Elson Areas, [place] 07/31/22 6045    Margarita Grizzle, MD 08/08/22 1004

## 2022-08-02 ENCOUNTER — Encounter (HOSPITAL_COMMUNITY): Payer: Self-pay

## 2022-08-02 ENCOUNTER — Other Ambulatory Visit: Payer: Self-pay

## 2022-08-02 ENCOUNTER — Emergency Department (HOSPITAL_COMMUNITY)
Admission: EM | Admit: 2022-08-02 | Discharge: 2022-08-02 | Disposition: A | Discharge status: Home / Self Care | Payer: Commercial Managed Care - HMO | Attending: Emergency Medicine | Admitting: Emergency Medicine

## 2022-08-02 DIAGNOSIS — M79672 Pain in left foot: Secondary | ICD-10-CM | POA: Insufficient documentation

## 2022-08-02 DIAGNOSIS — Z5900 Homelessness unspecified: Secondary | ICD-10-CM | POA: Diagnosis not present

## 2022-08-02 DIAGNOSIS — M79671 Pain in right foot: Secondary | ICD-10-CM | POA: Insufficient documentation

## 2022-08-02 NOTE — ED Notes (Signed)
Pt ambulatory without assistance.  

## 2022-08-02 NOTE — ED Provider Notes (Signed)
[place] EMERGENCY DEPARTMENT AT [facility] Provider Note   CSN: [id number] Arrival date & time: 08/02/22  0211     History  Chief Complaint  Patient presents with   Foot Pain    DASHTON CALDARELLI is a 37 y.o. male.  The history is provided by the patient and medical records.  Foot Pain   37 y.o. M here with bilateral foot pain.  Hx of trench foot, has flared up again after being out in the rain for the past few days.  He is currently homeless, shoes/socks are wet upon arrival.  States in the past xeroform dressings have helped him a lot.  Home Medications Prior to Admission medications   Medication Sig Start Date End Date Taking? Authorizing Provider  buPROPion (WELLBUTRIN XL) 150 MG 24 hr tablet Take 1 tablet (150 mg total) by mouth daily. 07/26/22   Mayers, Cari S, PA-C  gabapentin (NEURONTIN) 300 MG capsule Take 1 capsule (300 mg total) by mouth 3 (three) times daily. 07/26/22   Mayers, Cari S, PA-C  sertraline (ZOLOFT) 50 MG tablet Take 1 tablet (50 mg total) by mouth daily. 07/26/22   Mayers, Cari S, PA-C      Allergies    Patient has no known allergies.    Review of Systems   Review of Systems  Skin:  Positive for color change.  All other systems reviewed and are negative.   Physical Exam Updated Vital Signs BP (!) 137/95   Pulse 88   Temp 98.3 F (36.8 C) (Oral)   Resp 16   Ht 6' (1.829 m)   Wt 86 kg   SpO2 97%   BMI 25.71 kg/m   Physical Exam Vitals and nursing note reviewed.  Constitutional:      Appearance: He is well-developed.  HENT:     Head: Normocephalic and atraumatic.  Eyes:     Conjunctiva/sclera: Conjunctivae normal.     Pupils: Pupils are equal, round, and reactive to light.  Cardiovascular:     Rate and Rhythm: Normal rate and regular rhythm.     Heart sounds: Normal heart sounds.  Pulmonary:     Effort: Pulmonary effort is normal.     Breath sounds: Normal breath sounds.  Musculoskeletal:        General: Normal range  of motion.     Cervical back: Normal range of motion.     Comments: Bilateral feet with white discoloration, notably along balls of the feet; appear waterlogged and wrinkled, no skin sloughing or significant blistering present, no necrosis, DP pulses intact BLE, remains ambulatory  Skin:    General: Skin is warm and dry.  Neurological:     Mental Status: He is alert and oriented to person, place, and time.     ED Results / Procedures / Treatments   Labs (all labs ordered are listed, but only abnormal results are displayed) Labs Reviewed - No data to display  EKG None  Radiology No results found.  Procedures Procedures    Medications Ordered in ED Medications - No data to display  ED Course/ Medical Decision Making/ A&P                             Medical Decision Making  37 year old male here with bilateral foot pain.  History of trench foot and feels like he has it again after being caught in the rain all weekend.  He is currently homeless.  Bilateral feet are discolored white along the soles, appear wrinkled and water logged.  No discrete blistering or abscess formation.  No necrosis.  DP pulses intact BLE, he remains ambulatory.  Reports xeroform dressings have helped in the past, given packets of those along with new socks and shoes.  Advised to try to keep feet as dry as possible.  Can return here for new concerns.  Final Clinical Impression(s) / ED Diagnoses Final diagnoses:  Bilateral foot pain    Rx / DC Orders ED Discharge Orders     None         Garlon Hatchet, PA-C 08/02/22 0453    Dione Booze, MD 08/02/22 [phone]

## 2022-08-02 NOTE — ED Triage Notes (Signed)
Patient called for triage and states he is going to go smoke first.

## 2022-08-02 NOTE — ED Triage Notes (Signed)
Patient reports bilateral foot pain and having "trench foot" from walking around in the rain and his feet staying wet.

## 2022-08-02 NOTE — Discharge Instructions (Addendum)
Try to keep feet dry as much as you can.

## 2022-08-05 ENCOUNTER — Other Ambulatory Visit: Payer: Self-pay

## 2022-08-05 ENCOUNTER — Encounter (HOSPITAL_COMMUNITY): Payer: Self-pay

## 2022-08-05 ENCOUNTER — Emergency Department (HOSPITAL_COMMUNITY)
Admission: EM | Admit: 2022-08-05 | Discharge: 2022-08-05 | Disposition: A | Discharge status: Home / Self Care | Payer: Commercial Managed Care - HMO | Attending: Emergency Medicine | Admitting: Emergency Medicine

## 2022-08-05 DIAGNOSIS — F419 Anxiety disorder, unspecified: Secondary | ICD-10-CM | POA: Diagnosis present

## 2022-08-05 MED ORDER — LORAZEPAM 1 MG PO TABS
1.0000 mg | ORAL_TABLET | Freq: Once | ORAL | Status: AC
  Administered 2022-08-05: 1 mg via ORAL
  Filled 2022-08-05: qty 1

## 2022-08-05 NOTE — ED Notes (Signed)
Pt ambulating independently to the restroom with steady gait. Denies dizziness or SOB at this time

## 2022-08-05 NOTE — ED Provider Notes (Signed)
[place] EMERGENCY DEPARTMENT AT [facility] Provider Note   CSN: [id number] Arrival date & time: 08/05/22  4098     History  Chief Complaint  Patient presents with   Panic Attack   Dizziness    WILKIN ELL is a 37 y.o. male.  The history is provided by the patient and medical records.  Dizziness  37 year old male with history of depression, anxiety disorder, sleep apnea, GERD, ADHD, homelessness, presenting to the ED for anxiety.  Patient has been homeless for quite some time now states usually paces around all day and night.  Tonight he walked to a local [employer] and called 911 due to having a panic attack.  He states there were others around him and this caused him to panic a bit.  No one threatened or physically harmed him in any way.  Also reports he is having some bilateral foot pain which is chronic.  Patient is supposed to be taking Zoloft, gabapentin, and Wellbutrin for his symptoms, however states he cannot afford to pick up his medications from the pharmacy so has not been taking them.  No SI/HI voiced.  Home Medications Prior to Admission medications   Medication Sig Start Date End Date Taking? Authorizing Provider  buPROPion (WELLBUTRIN XL) 150 MG 24 hr tablet Take 1 tablet (150 mg total) by mouth daily. 07/26/22   Mayers, Cari S, PA-C  gabapentin (NEURONTIN) 300 MG capsule Take 1 capsule (300 mg total) by mouth 3 (three) times daily. 07/26/22   Mayers, Cari S, PA-C  sertraline (ZOLOFT) 50 MG tablet Take 1 tablet (50 mg total) by mouth daily. 07/26/22   Mayers, Cari S, PA-C      Allergies    Patient has no known allergies.    Review of Systems   Review of Systems  Psychiatric/Behavioral:  The patient is nervous/anxious.   All other systems reviewed and are negative.   Physical Exam Updated Vital Signs BP (!) 164/109 (BP Location: Right Arm)   Pulse (!) 102   Temp 97.7 F (36.5 C) (Oral)   Resp 16   Ht 6' (1.829 m)   Wt 86.2 kg   BMI 25.77  kg/m   Physical Exam Vitals and nursing note reviewed.  Constitutional:      Appearance: He is well-developed.  HENT:     Head: Normocephalic and atraumatic.  Eyes:     Conjunctiva/sclera: Conjunctivae normal.     Pupils: Pupils are equal, round, and reactive to light.  Cardiovascular:     Rate and Rhythm: Normal rate and regular rhythm.     Heart sounds: Normal heart sounds.  Pulmonary:     Effort: Pulmonary effort is normal.     Breath sounds: Normal breath sounds.  Abdominal:     General: Bowel sounds are normal.     Palpations: Abdomen is soft.  Musculoskeletal:        General: Normal range of motion.     Cervical back: Normal range of motion.  Skin:    General: Skin is warm and dry.  Neurological:     Mental Status: He is alert and oriented to person, place, and time.  Psychiatric:     Comments: Quite calm/cooperative on exam, not hyperventilating, not responding to internal stimuli Denies SI/HI     ED Results / Procedures / Treatments   Labs (all labs ordered are listed, but only abnormal results are displayed) Labs Reviewed - No data to display  EKG None  Radiology No  results found.  Procedures Procedures    Medications Ordered in ED Medications  LORazepam (ATIVAN) tablet 1 mg (1 mg Oral Given 08/05/22 0541)    ED Course/ Medical Decision Making/ A&P                             Medical Decision Making Risk Prescription drug management.   37 year old male presenting to the ED from [place] [facility] due to anxiety.  He has a longstanding history of same, well known to the ED as well.  Currently noncompliant with his medications as prescribed as he cannot afford them or get to the pharmacy to pick them up.  He appears quite calm on exam, he is not hyperventilating or responding to internal stimuli.  He is hemodynamically stable.  No SI/HI voiced.  It appears he is supposed to be taking gabapentin, Zoloft, and Wellbutrin, cannot recall the last time he  had these.  He is requesting something for his "nerves"-- given dose of ativan.  Encouraged to try and pick up his medications as soon as possible.  Can follow-up with [facility].  Can return here for new concerns.  Final Clinical Impression(s) / ED Diagnoses Final diagnoses:  Anxiety disorder, unspecified type    Rx / DC Orders ED Discharge Orders     None         Garlon Hatchet, PA-C 08/05/22 1610    Nicanor Alcon, April, MD 08/05/22 [phone]

## 2022-08-05 NOTE — Discharge Instructions (Addendum)
Try to pick up your medications from the pharmacy, take as directed. Can follow-up with [facility] if continued issues. Return here for new concerns.

## 2022-08-05 NOTE — ED Triage Notes (Signed)
Pt brought in by EMS from [facility] after having walked a long time. Per EMS, pt very anxious about being outside and concerned about other people. Pt also reporting mild dizziness and SOB. Pt continues to report foot pain, but states that this is not new

## 2022-08-07 ENCOUNTER — Encounter (HOSPITAL_COMMUNITY): Payer: Self-pay | Admitting: *Deleted

## 2022-08-07 ENCOUNTER — Emergency Department (HOSPITAL_COMMUNITY)
Admission: EM | Admit: 2022-08-07 | Discharge: 2022-08-07 | Discharge status: Against Medical Advice | Payer: Commercial Managed Care - HMO | Source: Home / Self Care | Attending: Emergency Medicine | Admitting: Emergency Medicine

## 2022-08-07 ENCOUNTER — Emergency Department (HOSPITAL_COMMUNITY)
Admission: EM | Admit: 2022-08-07 | Discharge: 2022-08-07 | Discharge status: Against Medical Advice | Payer: Commercial Managed Care - HMO | Attending: Emergency Medicine | Admitting: Emergency Medicine

## 2022-08-07 ENCOUNTER — Other Ambulatory Visit: Payer: Self-pay

## 2022-08-07 DIAGNOSIS — D72829 Elevated white blood cell count, unspecified: Secondary | ICD-10-CM | POA: Insufficient documentation

## 2022-08-07 DIAGNOSIS — F419 Anxiety disorder, unspecified: Secondary | ICD-10-CM

## 2022-08-07 DIAGNOSIS — Z5329 Procedure and treatment not carried out because of patient's decision for other reasons: Secondary | ICD-10-CM | POA: Insufficient documentation

## 2022-08-07 DIAGNOSIS — Z5900 Homelessness unspecified: Secondary | ICD-10-CM | POA: Diagnosis not present

## 2022-08-07 DIAGNOSIS — Z5321 Procedure and treatment not carried out due to patient leaving prior to being seen by health care provider: Secondary | ICD-10-CM | POA: Insufficient documentation

## 2022-08-07 LAB — CBC
HCT: 43.9 % (ref 39.0–52.0)
Hemoglobin: 14.5 g/dL (ref 13.0–17.0)
MCH: 31.3 pg (ref 26.0–34.0)
MCHC: 33 g/dL (ref 30.0–36.0)
MCV: 94.8 fL (ref 80.0–100.0)
Platelets: 310 10*3/uL (ref 150–400)
RBC: 4.63 MIL/uL (ref 4.22–5.81)
RDW: 14.2 % (ref 11.5–15.5)
WBC: 12.1 10*3/uL — ABNORMAL HIGH (ref 4.0–10.5)
nRBC: 0 % (ref 0.0–0.2)

## 2022-08-07 LAB — COMPREHENSIVE METABOLIC PANEL
ALT: 34 U/L (ref 0–44)
AST: 36 U/L (ref 15–41)
Albumin: 4.1 g/dL (ref 3.5–5.0)
Alkaline Phosphatase: 79 U/L (ref 38–126)
Anion gap: 15 (ref 5–15)
BUN: 16 mg/dL (ref 6–20)
CO2: 21 mmol/L — ABNORMAL LOW (ref 22–32)
Calcium: 9.2 mg/dL (ref 8.9–10.3)
Chloride: 99 mmol/L (ref 98–111)
Creatinine, Ser: 0.91 mg/dL (ref 0.61–1.24)
GFR, Estimated: >60 mL/min (ref >60)
Glucose, Bld: 69 mg/dL — ABNORMAL LOW (ref 70–99)
Potassium: 4.5 mmol/L (ref 3.5–5.1)
Sodium: 135 mmol/L (ref 135–145)
Total Bilirubin: 1.1 mg/dL (ref 0.3–1.2)
Total Protein: 8 g/dL (ref 6.5–8.1)

## 2022-08-07 LAB — RAPID URINE DRUG SCREEN, HOSP PERFORMED
Amphetamines: POSITIVE — AB
Barbiturates: NOT DETECTED
Benzodiazepines: NOT DETECTED
Cocaine: NOT DETECTED
Opiates: NOT DETECTED
Tetrahydrocannabinol: NOT DETECTED

## 2022-08-07 LAB — ETHANOL: Alcohol, Ethyl (B): <10 mg/dL (ref <10)

## 2022-08-07 MED ORDER — LORAZEPAM 1 MG PO TABS
1.0000 mg | ORAL_TABLET | Freq: Once | ORAL | Status: AC
  Administered 2022-08-07: 1 mg via ORAL
  Filled 2022-08-07: qty 1

## 2022-08-07 NOTE — ED Triage Notes (Signed)
Pt c/o anxiety. Has not been taking their meds, and took meth this morning.  Pt is AOx4

## 2022-08-07 NOTE — ED Provider Notes (Signed)
[place] EMERGENCY DEPARTMENT AT [facility] Provider Note   CSN: [id number] Arrival date & time: 08/07/22  4098     History  Chief Complaint  Patient presents with   Anxiety    Ricky Wu is a 37 y.o. male who presents to the ED with concerns for anxiety onset PTA.  Patient notes that he used meth at 3 AM this morning.  Also notes 1 beer today.  Typically drinks 2 of the larger size beers daily.  Also endorses feeling dizzy.  Endorses feeling paranoid at this time.  No meds tried prior to arrival.  Has associated upper abdominal pain.  Denies nausea, vomiting, chest pain, AVH, SI, HI.  The history is provided by the patient. No language interpreter was used.       Home Medications Prior to Admission medications   Medication Sig Start Date End Date Taking? Authorizing Provider  buPROPion (WELLBUTRIN XL) 150 MG 24 hr tablet Take 1 tablet (150 mg total) by mouth daily. 07/26/22   Mayers, Cari S, PA-C  gabapentin (NEURONTIN) 300 MG capsule Take 1 capsule (300 mg total) by mouth 3 (three) times daily. 07/26/22   Mayers, Cari S, PA-C  sertraline (ZOLOFT) 50 MG tablet Take 1 tablet (50 mg total) by mouth daily. 07/26/22   Mayers, Cari S, PA-C      Allergies    Patient has no known allergies.    Review of Systems   Review of Systems  All other systems reviewed and are negative.   Physical Exam Updated Vital Signs BP (!) 149/100 (BP Location: Right Arm)   Pulse (!) 112   Temp 98.5 F (36.9 C)   Resp 18   SpO2 100%  Physical Exam Vitals and nursing note reviewed.  Constitutional:      General: He is not in acute distress.    Appearance: He is not diaphoretic.     Comments: Anxious appearing  HENT:     Head: Normocephalic and atraumatic.     Mouth/Throat:     Pharynx: No oropharyngeal exudate.  Eyes:     General: No scleral icterus.    Conjunctiva/sclera: Conjunctivae normal.  Cardiovascular:     Rate and Rhythm: Normal rate and regular rhythm.      Pulses: Normal pulses.     Heart sounds: Normal heart sounds.  Pulmonary:     Effort: Pulmonary effort is normal. No respiratory distress.     Breath sounds: Normal breath sounds. No wheezing.  Abdominal:     General: Bowel sounds are normal.     Palpations: Abdomen is soft. There is no mass.     Tenderness: There is no abdominal tenderness. There is no guarding or rebound.  Musculoskeletal:        General: Normal range of motion.     Cervical back: Normal range of motion and neck supple.  Skin:    General: Skin is warm and dry.  Neurological:     Mental Status: He is alert.  Psychiatric:        Behavior: Behavior normal.     ED Results / Procedures / Treatments   Labs (all labs ordered are listed, but only abnormal results are displayed) Labs Reviewed  COMPREHENSIVE METABOLIC PANEL - Abnormal; Notable for the following components:      Result Value   CO2 21 (*)    Glucose, Bld 69 (*)    All other components within normal limits  CBC - Abnormal; Notable for the following  components:   WBC 12.1 (*)    All other components within normal limits  RAPID URINE DRUG SCREEN, HOSP PERFORMED - Abnormal; Notable for the following components:   Amphetamines POSITIVE (*)    All other components within normal limits  ETHANOL    EKG None  Radiology No results found.  Procedures Procedures    Medications Ordered in ED Medications  LORazepam (ATIVAN) tablet 1 mg (1 mg Oral Given 08/07/22 1016)    ED Course/ Medical Decision Making/ A&P Clinical Course as of 08/07/22 1615  Sun Aug 07, 2022  1126 Pt re-evaluated and resting comfortably on the side of the stretcher.  Patient with no questions at this time.  [SB]  1150 Notified by RN that patient eloped from the ED. [SB]    Clinical Course User Index [SB] Thekla Colborn A, PA-C                             Medical Decision Making Amount and/or Complexity of Data Reviewed Labs: ordered.  Risk Prescription drug  management.   Pt presents with concerns for anxiety onset prior to arrival.  Patient notes that he uses meth amphetamine at 3 AM this morning.  Denies auditory/visual hallucinations, denies SI, HI. Vital signs, pt afebrile. On exam, pt with no acute cardiovascular, respiratory, abdominal exam findings.  Patient is anxious to go home on exam.  Patient requesting medication to help with his nerves..   Additional history obtained:  External records from outside source obtained and reviewed including: Patient has been evaluated multiple times for similar symptoms in the past in the ED.  Labs:  I ordered, and personally interpreted labs.  The pertinent results include:   UDS that showed positive for amphetamines ETOH negative CMP unremarkable CBC with leukocytosis otherwise unremarkable, likely in the setting of recent drug use  Medications:  I ordered medication including ativan for symptom management Reevaluation of the patient after these medicines and interventions, I reevaluated the patient and found that they have improved I have reviewed the patients home medicines and have made adjustments as needed   Disposition: Presenting suspicious for anxiety.  Patient has a history of similar concerns.  Patient without concerns for SI/HI, no concerns for auditory or visual hallucinations.  Patient is anxious however, on exam.  Patient given a dose of Ativan here in the emergency department and encouraged to continue to take his medications as prescribed to him.  Patient was to be discharged home however, notified by RN that patient eloped from the emergency department.    This chart was dictated using voice recognition software, Dragon. Despite the best efforts of this provider to proofread and correct errors, errors may still occur which can change documentation meaning.   Final Clinical Impression(s) / ED Diagnoses Final diagnoses:  Anxiety    Rx / DC Orders ED Discharge Orders      None         Charlies Rayburn A, PA-C 08/07/22 1617    Wynetta Fines, MD 08/13/22 1053

## 2022-08-07 NOTE — ED Notes (Signed)
Pt not on stretcher, checked bathroom, not in bathroom. Provider notified. Patient appeared to have eloped.

## 2022-08-07 NOTE — ED Triage Notes (Signed)
Two hours ago he had a beer and neth  that's all he uses

## 2022-08-07 NOTE — ED Notes (Signed)
Pt going to the bathroom several times. States he is just washing his hands.

## 2022-08-07 NOTE — ED Notes (Signed)
Pt asking to leave, states he feels fine now. Informed provider will be back to reevaluate him, verbalized understanding and sat back down.

## 2022-08-07 NOTE — ED Triage Notes (Signed)
The pt reports that he has extreme anxiety the past 24 hours it has been  hard to deal with  he has taken gabapentin but has not had any for 4 months  he's  homeless and he reports walking around everywhere increases his anxiety

## 2022-08-10 ENCOUNTER — Other Ambulatory Visit: Payer: Self-pay

## 2022-08-10 ENCOUNTER — Encounter (HOSPITAL_COMMUNITY): Payer: Self-pay

## 2022-08-10 ENCOUNTER — Emergency Department (HOSPITAL_COMMUNITY)
Admission: EM | Admit: 2022-08-10 | Discharge: 2022-08-11 | Disposition: A | Discharge status: Home / Self Care | Payer: Commercial Managed Care - HMO | Attending: Emergency Medicine | Admitting: Emergency Medicine

## 2022-08-10 DIAGNOSIS — R42 Dizziness and giddiness: Secondary | ICD-10-CM | POA: Diagnosis not present

## 2022-08-10 DIAGNOSIS — Z5321 Procedure and treatment not carried out due to patient leaving prior to being seen by health care provider: Secondary | ICD-10-CM | POA: Insufficient documentation

## 2022-08-10 DIAGNOSIS — R222 Localized swelling, mass and lump, trunk: Secondary | ICD-10-CM | POA: Diagnosis not present

## 2022-08-10 DIAGNOSIS — M79672 Pain in left foot: Secondary | ICD-10-CM | POA: Diagnosis present

## 2022-08-10 DIAGNOSIS — R000 Tachycardia, unspecified: Secondary | ICD-10-CM | POA: Insufficient documentation

## 2022-08-10 DIAGNOSIS — M79671 Pain in right foot: Secondary | ICD-10-CM | POA: Insufficient documentation

## 2022-08-10 DIAGNOSIS — R6889 Other general symptoms and signs: Secondary | ICD-10-CM

## 2022-08-10 NOTE — ED Triage Notes (Signed)
Patient BIB EMS for anxiety, paranoia, dizziness, dry mouth, and bil foot pain x 1 day. Patient in NAD at time of arrival.

## 2022-08-11 ENCOUNTER — Other Ambulatory Visit: Payer: Self-pay

## 2022-08-11 ENCOUNTER — Emergency Department (HOSPITAL_COMMUNITY)
Admission: EM | Admit: 2022-08-11 | Discharge: 2022-08-11 | Discharge status: Against Medical Advice | Payer: Commercial Managed Care - HMO | Source: Home / Self Care

## 2022-08-11 DIAGNOSIS — M79672 Pain in left foot: Secondary | ICD-10-CM | POA: Insufficient documentation

## 2022-08-11 DIAGNOSIS — R222 Localized swelling, mass and lump, trunk: Secondary | ICD-10-CM | POA: Insufficient documentation

## 2022-08-11 DIAGNOSIS — M79671 Pain in right foot: Secondary | ICD-10-CM | POA: Insufficient documentation

## 2022-08-11 DIAGNOSIS — Z5321 Procedure and treatment not carried out due to patient leaving prior to being seen by health care provider: Secondary | ICD-10-CM | POA: Insufficient documentation

## 2022-08-11 MED ORDER — CLONAZEPAM 0.5 MG PO TABS
1.0000 mg | ORAL_TABLET | Freq: Once | ORAL | Status: AC
  Administered 2022-08-11: 1 mg via ORAL
  Filled 2022-08-11: qty 2

## 2022-08-11 MED ORDER — ACETAMINOPHEN 500 MG PO TABS
1000.0000 mg | ORAL_TABLET | Freq: Once | ORAL | Status: AC
  Administered 2022-08-11: 1000 mg via ORAL
  Filled 2022-08-11: qty 2

## 2022-08-11 NOTE — ED Provider Notes (Signed)
[place] EMERGENCY DEPARTMENT AT [facility] Provider Note   CSN: [id number] Arrival date & time: 08/10/22  2328     History {Add pertinent medical, surgical, social history, OB history to HPI:1} Chief Complaint  Patient presents with   Dizziness   Mental Health Problem    Ricky Wu is a 37 y.o. male.  The history is provided by the patient and medical records.  Dizziness Mental Health Problem Associated symptoms: anxiety    37 y.o. M here with multiple complaints.  Reports anxiety, paranoia, lightheadedness, dry mouth and bilateral foot pain.  Hx of anxiety, states worse lately as he is not able to get his medications.  He has been sporadically getting ativan when in the ED but that is no longer helping.  Reports he was on klonopin previously and requested to try that again.  He states mouth is dry from illicit drug use today, using this to self medicate since he cannot get his prescriptions.  Denies intentional self harm.  He did eat/drink today.  Also has foot pain since feet got wet yesterday in the rain.  Hx of trench foot.  Home Medications Prior to Admission medications   Medication Sig Start Date End Date Taking? Authorizing Provider  buPROPion (WELLBUTRIN XL) 150 MG 24 hr tablet Take 1 tablet (150 mg total) by mouth daily. 07/26/22   Mayers, Cari S, PA-C  gabapentin (NEURONTIN) 300 MG capsule Take 1 capsule (300 mg total) by mouth 3 (three) times daily. 07/26/22   Mayers, Cari S, PA-C  sertraline (ZOLOFT) 50 MG tablet Take 1 tablet (50 mg total) by mouth daily. 07/26/22   Mayers, Cari S, PA-C      Allergies    Patient has no known allergies.    Review of Systems   Review of Systems  Musculoskeletal:  Positive for arthralgias.  Neurological:  Positive for light-headedness.  Psychiatric/Behavioral:  The patient is nervous/anxious.   All other systems reviewed and are negative.   Physical Exam Updated Vital Signs BP (!) 139/102 (BP Location: Right  Arm)   Pulse (!) 112   Temp 98.3 F (36.8 C) (Oral)   Resp 18   SpO2 98%   Physical Exam Vitals and nursing note reviewed.  Constitutional:      Appearance: He is well-developed.     Comments: Disheveled, dirty appearing  HENT:     Head: Normocephalic and atraumatic.     Mouth/Throat:     Comments: Moist mucous membranes Eyes:     Conjunctiva/sclera: Conjunctivae normal.     Pupils: Pupils are equal, round, and reactive to light.  Cardiovascular:     Rate and Rhythm: Normal rate and regular rhythm.     Heart sounds: Normal heart sounds.  Pulmonary:     Effort: Pulmonary effort is normal. No respiratory distress.     Breath sounds: Normal breath sounds. No rhonchi.  Abdominal:     General: Bowel sounds are normal.     Palpations: Abdomen is soft.     Tenderness: There is no guarding.     Hernia: No hernia is present.  Musculoskeletal:        General: Normal range of motion.     Cervical back: Normal range of motion.  Skin:    General: Skin is warm and dry.  Neurological:     Mental Status: He is alert and oriented to person, place, and time.  Psychiatric:     Comments: No SI/HI voiced  ED Results / Procedures / Treatments   Labs (all labs ordered are listed, but only abnormal results are displayed) Labs Reviewed - No data to display  EKG None  Radiology No results found.  Procedures Procedures  {Document cardiac monitor, telemetry assessment procedure when appropriate:1}  Medications Ordered in ED Medications  acetaminophen (TYLENOL) tablet 1,000 mg (has no administration in time range)  clonazePAM (KLONOPIN) tablet 1 mg (has no administration in time range)    ED Course/ Medical Decision Making/ A&P   {   Click here for ABCD2, HEART and other calculatorsREFRESH Note before signing :1}                          Medical Decision Making Risk OTC drugs. Prescription drug management.   ***  {Document critical care time when  appropriate:1} {Document review of labs and clinical decision tools ie heart score, Chads2Vasc2 etc:1}  {Document your independent review of radiology images, and any outside records:1} {Document your discussion with family members, caretakers, and with consultants:1} {Document social determinants of health affecting pt's care:1} {Document your decision making why or why not admission, treatments were needed:1} Final Clinical Impression(s) / ED Diagnoses Final diagnoses:  None    Rx / DC Orders ED Discharge Orders     None

## 2022-08-11 NOTE — ED Triage Notes (Signed)
Pt arrives c/o sores and pain to bilateral feet, but states R foot is worse. Symptoms ongoing x2 days. States these are from walking in the rain. Pt also states that he has a bump on his chest that he wants evaluated - points to epigastric area and states that he thinks it may be from losing weight.

## 2022-08-11 NOTE — Discharge Instructions (Signed)
Can follow-up at [facility] if anxiety symptoms persist.

## 2022-08-13 ENCOUNTER — Ambulatory Visit (HOSPITAL_COMMUNITY)
Admission: EM | Admit: 2022-08-13 | Discharge: 2022-08-13 | Disposition: A | Discharge status: Home / Self Care | Payer: Commercial Managed Care - HMO | Attending: Family Medicine | Admitting: Family Medicine

## 2022-08-13 ENCOUNTER — Emergency Department (HOSPITAL_COMMUNITY)
Admission: EM | Admit: 2022-08-13 | Discharge: 2022-08-13 | Discharge status: Against Medical Advice | Payer: Commercial Managed Care - HMO | Attending: Emergency Medicine | Admitting: Emergency Medicine

## 2022-08-13 ENCOUNTER — Other Ambulatory Visit: Payer: Self-pay

## 2022-08-13 DIAGNOSIS — F419 Anxiety disorder, unspecified: Secondary | ICD-10-CM | POA: Insufficient documentation

## 2022-08-13 DIAGNOSIS — Z765 Malingerer [conscious simulation]: Secondary | ICD-10-CM

## 2022-08-13 DIAGNOSIS — Z5321 Procedure and treatment not carried out due to patient leaving prior to being seen by health care provider: Secondary | ICD-10-CM | POA: Insufficient documentation

## 2022-08-13 DIAGNOSIS — F411 Generalized anxiety disorder: Secondary | ICD-10-CM

## 2022-08-13 DIAGNOSIS — Z5900 Homelessness unspecified: Secondary | ICD-10-CM | POA: Diagnosis not present

## 2022-08-13 MED ORDER — GABAPENTIN 300 MG PO CAPS
300.0000 mg | ORAL_CAPSULE | Freq: Three times a day (TID) | ORAL | 0 refills | Status: AC
Start: 2022-08-13 — End: ?

## 2022-08-13 MED ORDER — BUPROPION HCL ER (XL) 150 MG PO TB24
150.0000 mg | ORAL_TABLET | Freq: Every day | ORAL | 0 refills | Status: AC
Start: 2022-08-13 — End: ?

## 2022-08-13 MED ORDER — HYDROXYZINE HCL 25 MG PO TABS: 25.0000 mg | ORAL_TABLET | Freq: Three times a day (TID) | ORAL | 0 refills | Status: AC | PRN

## 2022-08-13 MED ORDER — SERTRALINE HCL 50 MG PO TABS
50.0000 mg | ORAL_TABLET | Freq: Every day | ORAL | 0 refills | Status: DC
Start: 2022-08-13 — End: 2022-08-23

## 2022-08-13 NOTE — ED Notes (Signed)
Patient walked out of facility w/o being seen, MD made aware

## 2022-08-13 NOTE — Discharge Instructions (Signed)
Return here to [facility] behavioral health for outpatient services for medication management and therapy Monday through Friday at between 715 and 730 to establish in the walk-in clinic.  You are given a short supply of refills for your medication.  Please establish in the outpatient clinic for ongoing medication management.

## 2022-08-13 NOTE — ED Triage Notes (Signed)
Pt BIBA from street. C/o anxiety after smoking meth.   AOx4

## 2022-08-13 NOTE — ED Provider Notes (Signed)
Behavioral Health Urgent Care Medical Screening Exam  Patient Name: Ricky Wu MRN: [id number] Date of Evaluation: 08/13/22 Chief Complaint:   Diagnosis:  Final diagnoses:  None    History of Present illness: Ricky Wu is a 37 y.o. male, here voluntarily transported by law enforcement stating that he is out of his psychiatric medications.  It is known to the behavioral health service line in often over utilizes the emergency department for occasion refills.  Patient was seen here at [facility] on twice in 1 day on June 16, 2022 requesting housing resources and medication for generalized anxiety disorder.  Within the last 2 weeks patient has frequent in the emergency department a total of 7 times since Jul 31, 2022.  Patient is homeless   Flowsheet Row ED from 08/11/2022 in [facility] Emergency Department at [facility] ED from 08/10/2022 in [facility] Emergency Department at [facility] ED from 08/07/2022 in [facility] Emergency Department at [facility]  C-SSRS RISK CATEGORY No Risk No Risk No Risk       Psychiatric Specialty Exam  Presentation  General Appearance:Casual  Eye Contact:Fair  Speech:Clear and Coherent  Speech Volume:Normal  Handedness:Right   Mood and Affect  Mood: Anxious  Affect: Appropriate   Thought Process  Thought Processes: Coherent  Descriptions of Associations:Circumstantial  Orientation:Full (Time, Place and Person)  Thought Content:WDL  Diagnosis of Schizophrenia or Schizoaffective disorder in past: No   Hallucinations:None  Ideas of Reference:None  Suicidal Thoughts:No  Homicidal Thoughts:No   Sensorium  Memory: Immediate Fair  Judgment: Poor  Insight: Fair   Art therapist  Concentration: Good  Attention Span: Good  Recall: Good  Fund of Knowledge: Good  Language: Good   Psychomotor Activity  Psychomotor Activity: Normal   Assets  Assets: Desire for  Improvement; Housing   Sleep  Sleep: Fair Physical Exam: Physical Exam HENT:     Head: Normocephalic and atraumatic.  Eyes:     Extraocular Movements: Extraocular movements intact.     Pupils: Pupils are equal, round, and reactive to light.  Cardiovascular:     Rate and Rhythm: Regular rhythm. Tachycardia present.  Pulmonary:     Effort: Pulmonary effort is normal.     Breath sounds: Normal breath sounds.  Skin:    Capillary Refill: Capillary refill takes less than 2 seconds.  Neurological:     General: No focal deficit present.     Mental Status: He is alert.  Psychiatric:        Mood and Affect: Mood is anxious.    Review of Systems  Psychiatric/Behavioral:  Positive for substance abuse. Negative for depression, hallucinations, memory loss and suicidal ideas. The patient is nervous/anxious. The patient does not have insomnia.      Blood pressure (!) 137/103, pulse (!) 124, temperature 97.8 F (36.6 C), temperature source Oral, resp. rate 19, SpO2 100 %. There is no height or weight on file to calculate BMI.    [facility] MSE Discharge Disposition for Follow up and Recommendations: Based on my evaluation the patient does not appear to have an emergency medical condition and can be discharged with resources and follow up care in outpatient services for Medication Management and Group Therapy here at [facility]patient [facility] clinic. Patient requested two bus passes and agreed to provide a 10 day refill of psychiatric medications. TTS provided a list of addiction treatment programs and services.   Joaquin Courts, NP-C 08/13/2022, 3:48 PM

## 2022-08-13 NOTE — Progress Notes (Signed)
   08/13/22 1608  [facility] Triage Screening (Walk-ins at [facility] only)  How Did You Hear About [place]? Self  What Is the Reason for Your Visit/Call Today? Patient presents by [employer] voluntary to [facility] requesting assistance with medication managment. Patient denies any S/I, H/I or AVH. Patient has a history significant for MDD and GAD. Patient was last seen on 4/30 at [facility] when he presented with similar symptoms and was prescribed medications (See MAR) at that time but reports this date that he never had them filled. 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 Patient denies any other substance issues.  How Long Has This Been Causing You Problems? 1 wk - 1 month  Have You Recently Had Any Thoughts About Hurting Yourself? No  Are You Planning to Commit Suicide/Harm Yourself At This time? No  Have you Recently Had Thoughts About Hurting Someone [name]? No  Are You Planning To Harm Someone At This Time? No  Explanation: NA  Are you currently experiencing any auditory, visual or other hallucinations? No  Have You Used Any Alcohol or Drugs in the Past 24 Hours? Yes  How long ago did you use Drugs or Alcohol? Earlier this date  What Did You Use and How Much? Less than one tenth of a gram of Methamphetamines earlier this date  Do you have any current medical co-morbidities that require immediate attention? No  Clinician description of patient physical appearance/behavior: Patient is cooperative and pleasant  What Do You Feel Would Help You the Most Today? Medication(s)  If access to [facility] was not available, would you have sought care in the Emergency Department? No  Determination of Need Routine (7 days)  Options For Referral Outpatient Therapy

## 2022-08-19 ENCOUNTER — Other Ambulatory Visit: Payer: Self-pay

## 2022-08-19 ENCOUNTER — Emergency Department (HOSPITAL_COMMUNITY)
Admission: EM | Admit: 2022-08-19 | Discharge: 2022-08-20 | Disposition: A | Discharge status: Home / Self Care | Payer: Commercial Managed Care - HMO | Attending: Emergency Medicine | Admitting: Emergency Medicine

## 2022-08-19 DIAGNOSIS — R451 Restlessness and agitation: Secondary | ICD-10-CM | POA: Insufficient documentation

## 2022-08-19 DIAGNOSIS — R400 Somnolence: Secondary | ICD-10-CM | POA: Diagnosis not present

## 2022-08-19 DIAGNOSIS — F1910 Other psychoactive substance abuse, uncomplicated: Secondary | ICD-10-CM

## 2022-08-19 DIAGNOSIS — F419 Anxiety disorder, unspecified: Secondary | ICD-10-CM | POA: Insufficient documentation

## 2022-08-19 MED ORDER — LACTATED RINGERS IV BOLUS
2000.0000 mL | Freq: Once | INTRAVENOUS | Status: AC
  Administered 2022-08-20: 2000 mL via INTRAVENOUS

## 2022-08-19 MED ORDER — ZIPRASIDONE MESYLATE 20 MG IM SOLR
10.0000 mg | Freq: Once | INTRAMUSCULAR | Status: AC
  Administered 2022-08-19: 10 mg via INTRAMUSCULAR
  Filled 2022-08-19: qty 20

## 2022-08-19 MED ORDER — STERILE WATER FOR INJECTION IJ SOLN
INTRAMUSCULAR | Status: AC
  Administered 2022-08-19: 10 mL
  Filled 2022-08-19: qty 10

## 2022-08-19 MED ORDER — LORAZEPAM 2 MG/ML IJ SOLN: 2.0000 mg | Freq: Once | INTRAMUSCULAR | Status: DC

## 2022-08-19 MED ORDER — LORAZEPAM 2 MG/ML IJ SOLN
2.0000 mg | Freq: Once | INTRAMUSCULAR | Status: AC
  Administered 2022-08-19: 2 mg via INTRAMUSCULAR
  Filled 2022-08-19: qty 1

## 2022-08-19 NOTE — ED Triage Notes (Signed)
Patient BIB EMS for evaluation of drug intoxication and running into traffic.  Patient reports smoking meth for "4 or 5 days."  Pt paranoid and combative on arrival.  Currently in restraints with EMS.  Will not follow commands and continues to attempt to get off stretcher.  Given Versed 5 mg IM PTA

## 2022-08-19 NOTE — ED Provider Notes (Signed)
Ricky Wu   CSN: [id number] Arrival date & time: 08/19/22  2249     History {Add pertinent medical, surgical, social history, OB history to HPI:1} Chief Complaint  Patient presents with   Drug Problem    Ricky Wu is a 37 y.o. male.  37 year old male that presents to the ER with multiple paramedics and [facility] police with him secondary to erratic behavior.  Police were called as patient was running around in the middle of traffic.  On paramedics arrival the patient initially was compliant and got into the ambulance but then started to get very agitated and violent so was given 5 of Versed and relaxed.  Still awake and still tangential noncommunicative with his speech.  Had told paramedics that he has been smoking meth for the last 4 to 5 days and has not slept during that time.  His blood pressure was in the 140s with them, heart rate in 130s, oxygen normal.  Difficult to obtain any history from the patient secondary to his intoxication and mental status.   Drug Problem       Home Medications Prior to Admission medications   Medication Sig Start Date End Date Taking? Authorizing Provider  buPROPion (WELLBUTRIN XL) 150 MG 24 hr tablet Take 1 tablet (150 mg total) by mouth daily. 08/13/22   Bing Neighbors, NP  gabapentin (NEURONTIN) 300 MG capsule Take 1 capsule (300 mg total) by mouth 3 (three) times daily. 08/13/22   Bing Neighbors, NP  hydrOXYzine (ATARAX) 25 MG tablet Take 1 tablet (25 mg total) by mouth 3 (three) times daily as needed for anxiety. 08/13/22   Bing Neighbors, NP  sertraline (ZOLOFT) 50 MG tablet Take 1 tablet (50 mg total) by mouth daily. 08/13/22   Bing Neighbors, NP      Allergies    Patient has no known allergies.    Review of Systems   Review of Systems  Physical Exam Updated Vital Signs There were no vitals taken for this visit. Physical Exam Vitals and nursing Wu  reviewed.  Constitutional:      Appearance: He is well-developed.  HENT:     Head: Normocephalic and atraumatic.  Cardiovascular:     Rate and Rhythm: Normal rate.  Pulmonary:     Effort: Pulmonary effort is normal. No respiratory distress.  Abdominal:     General: There is no distension.  Musculoskeletal:        General: Normal range of motion.     Cervical back: Normal range of motion.  Neurological:     Mental Status: He is alert.  Psychiatric:        Attention and Perception: He is inattentive.        Mood and Affect: Mood is anxious. Affect is labile.        Speech: He is noncommunicative. Speech is rapid and pressured and tangential.        Behavior: Behavior is aggressive and hyperactive.     ED Results / Procedures / Treatments   Labs (all labs ordered are listed, but only abnormal results are displayed) Labs Reviewed  CBC WITH DIFFERENTIAL/PLATELET  COMPREHENSIVE METABOLIC PANEL  CK  CBG MONITORING, ED    EKG None  Radiology No results found.  Procedures Procedures  {Document cardiac monitor, telemetry assessment procedure when appropriate:1}  Medications Ordered in ED Medications  ziprasidone (GEODON) injection 10 mg (has no administration in time range)  LORazepam (ATIVAN)  injection 2 mg (has no administration in time range)  lactated ringers bolus 2,000 mL (has no administration in time range)  sterile water (preservative free) injection (has no administration in time range)    ED Course/ Medical Decision Making/ A&P   {   Click here for ABCD2, HEART and other calculatorsREFRESH Wu before signing :1}                          Medical Decision Making Amount and/or Complexity of Data Reviewed Labs: ordered. ECG/medicine tests: ordered.  Risk Prescription drug management.  Will sedate and hydrate.  Check labs to ensure no rhabdo or AKI or electrolyte abnormalities. ***  {Document critical care time when appropriate:1} {Document review of  labs and clinical decision tools ie heart score, Chads2Vasc2 etc:1}  {Document your independent review of radiology images, and any outside records:1} {Document your discussion with family members, caretakers, and with consultants:1} {Document social determinants of health affecting pt's care:1} {Document your decision making why or why not admission, treatments were needed:1} Final Clinical Impression(s) / ED Diagnoses Final diagnoses:  None    Rx / DC Orders ED Discharge Orders     None

## 2022-08-20 DIAGNOSIS — R451 Restlessness and agitation: Secondary | ICD-10-CM | POA: Diagnosis not present

## 2022-08-20 LAB — CBG MONITORING, ED: Glucose-Capillary: 107 mg/dL — ABNORMAL HIGH (ref 70–99)

## 2022-08-20 LAB — COMPREHENSIVE METABOLIC PANEL
ALT: 71 U/L — ABNORMAL HIGH (ref 0–44)
AST: 31 U/L (ref 15–41)
Albumin: 3.8 g/dL (ref 3.5–5.0)
Alkaline Phosphatase: 78 U/L (ref 38–126)
Anion gap: 15 (ref 5–15)
BUN: 26 mg/dL — ABNORMAL HIGH (ref 6–20)
CO2: 19 mmol/L — ABNORMAL LOW (ref 22–32)
Calcium: 9 mg/dL (ref 8.9–10.3)
Chloride: 99 mmol/L (ref 98–111)
Creatinine, Ser: 1.48 mg/dL — ABNORMAL HIGH (ref 0.61–1.24)
GFR, Estimated: >60 mL/min (ref >60)
Glucose, Bld: 96 mg/dL (ref 70–99)
Potassium: 3.8 mmol/L (ref 3.5–5.1)
Sodium: 133 mmol/L — ABNORMAL LOW (ref 135–145)
Total Bilirubin: 1 mg/dL (ref 0.3–1.2)
Total Protein: 7.3 g/dL (ref 6.5–8.1)

## 2022-08-20 LAB — CBC WITH DIFFERENTIAL/PLATELET
Abs Immature Granulocytes: 0.07 10*3/uL (ref 0.00–0.07)
Basophils Absolute: 0.1 10*3/uL (ref 0.0–0.1)
Basophils Relative: 0 %
Eosinophils Absolute: 0 10*3/uL (ref 0.0–0.5)
Eosinophils Relative: 0 %
HCT: 42.3 % (ref 39.0–52.0)
Hemoglobin: 14.2 g/dL (ref 13.0–17.0)
Immature Granulocytes: 0 %
Lymphocytes Relative: 5 %
Lymphs Abs: 0.9 10*3/uL (ref 0.7–4.0)
MCH: 32 pg (ref 26.0–34.0)
MCHC: 33.6 g/dL (ref 30.0–36.0)
MCV: 95.3 fL (ref 80.0–100.0)
Monocytes Absolute: 1.4 10*3/uL — ABNORMAL HIGH (ref 0.1–1.0)
Monocytes Relative: 7 %
Neutro Abs: 17.6 10*3/uL — ABNORMAL HIGH (ref 1.7–7.7)
Neutrophils Relative %: 88 %
Platelets: 302 10*3/uL (ref 150–400)
RBC: 4.44 MIL/uL (ref 4.22–5.81)
RDW: 13.8 % (ref 11.5–15.5)
WBC: 20.1 10*3/uL — ABNORMAL HIGH (ref 4.0–10.5)
nRBC: 0 % (ref 0.0–0.2)

## 2022-08-20 LAB — CK: Total CK: 365 U/L (ref 49–397)

## 2022-08-20 NOTE — ED Provider Notes (Signed)
Blood pressure 126/88, pulse 72, temperature 97.6 F (36.4 C), temperature source Oral, resp. rate 13, SpO2 100 %.  Assuming care from Dr. Clayborne Dana.  In short, Ricky Wu is a 37 y.o. male with a chief complaint of Drug Problem .  Refer to the original H&P for additional details.  The current plan of care is to follow up after sobering.  08:15 AM  I went to reevaluate the patient.  He awakens to voice and light touch.  He does endorse using methamphetamine yesterday.  Denies any suicidal or homicidal ideation.  States he is currently feeling "calm." Will PO challenge and ambulate.   10:20 AM  Patient up and ambulatory. Awake/alert. Denies thoughts of self harm. Will d/c with resource guide.    Maia Plan, MD 08/20/22 1020

## 2022-08-20 NOTE — Discharge Instructions (Signed)
Substance Abuse Treatment Programs ° °Intensive Outpatient Programs °[facility]     °[address]      °[place], [place]                   °[phone]      ° °[facility] °[address] °[place], [place] °[phone] ° °[facility]     °(Inpatient and outpatient)     °[address]           °[phone]   ° °[facility] °[phone] (Suboxone and Methadone) ° °[address]      °[place], [place] [address]      °[phone]      ° °[address] °[place], [place] °852-3033 ° °[facility] (Outpatient/Inpatient, Chemical)    °(insurance only) [phone]      °       °[facility] (Groups & Residential) °[place], [place] °[phone] ° °   °[facility]     °[address]     °[place], [place]      °[phone]      ° °[facility] (for caregivers and family) °[address] °[place], [place] °336-299-4655 ° ° ° ° ° °Residential Treatment Programs °[facility]      °[address], [place], [place] [address]  °[phone]      ° °[facility] °[address] [place], [place] [address] °[phone] ° °[facility]        °[phone]      ° °[facility] °[phone] ° °[facility]             °[address]                                         °[place], [place]                                                °[phone] or [phone]                              ° °[facility] [place] °[address] °[place], [address] °[phone] ° °[facility]    °[address]      °[place], [place]     °[phone]      ° °[facility] °[address] °, [place] °[phone] ° °[facility]   °[address]     °[place], [place] [address]     °[phone]      °Admissions: 8am-3pm M-F ° °[facility] °[address] °[place],  [place] °336-227-7417 ° °[facility]: Residential Program (90 Days)   °[place], [place]      °336-725-8389 or [phone]    ° °[facility] °[place],  °(Walk in Hours over the weekend or by referral) ° °[facility] °[address], [place],  [address] °([phone] ° °Crisis Mobile: [facility]:  1-877-626-1772 (for crisis response 24 hours a day) °[facility] Hotline:      1-800-256-2452 °Outpatient Psychiatry and Counseling ° °[facility]: Mobile Crisis   Management 24 hours:  1-877-626-1772 ° °[facility] sliding scale fee and walk in schedule: M-F 8am-12pm/1pm-3pm °[address]  °[place], [place] [address] °[phone] ° °[facility] °[address] °[place], [place] [address] °336-703-9650 ° °[facility] ([facility])- new patient walk-in appointments available Monday - Friday 8am -3pm.          °[address] °[place], [place] [address] °336-676-6840 or crisis line- 336-676-6905 ° °[facility]/ Intensive Outpatient Therapy Program °[address] °[place], [place] [address] °336-832-9804 ° °[facility]                  °Crisis Services      °[phone]      °[address]     °[place], [place] [address]                ° °[facility]   °[facility] °[phone] °[address] °[place], [place] [address] ° ° °[facility]          °[address],  °[place], [place] [address]       °[phone] ° °[facility] °[address] °[place], [place] [address] °336-292-1510 ° °[facility]    °[address]    °[place], [place] [address]     °[phone]      ° °Parish McKinney, MD     °[address]     °[place] [address]     °336-282-1251     °  °[facility] °[address] °[place] [address] ° °[facility]     °[address]     °[place], [place]      °336-542-2076      ° °[facility] °Shamsher Ahluwalia, MD °[address] °[place], [place] [address] °[phone] ° °[facility]     °[address]     °[place], [place] [address]     °336-274-1237      ° °[facility] °[address] °[place], [place] [address] °336-854-4450 °Resources for Temporary Residential Assistance/Crisis Centers ° °DAY CENTERS °[facility] °M-F 8am-3pm   °[address] [place], [place] [address]   336-332-0824 °Services include: laundry, barbering, support groups, case management, phone  & computer access, showers, AA/NA mtgs, mental health/substance abuse nurse, job skills class, disability information, VA assistance, spiritual classes, etc.  ° °HOMELESS SHELTERS ° °[facility]     °[facility]   °[address], [place]     °[phone]       °       °[facility] (women and children)       °[address] °[place], [place] [address] °336-275-0820 °[email or web address] for application and process °Application Required ° °[facility]   °[address]    °[place] [address]     °[phone]       °             °[facility] °[address] °,  [address] °336.273.5572 °336-235-0363(schedule application appt.) °Application Required ° °[facility] (women only)    °[address]     °[place],  [address]     °336-884-1039      °  Intake starts 6pm daily °Need valid ID, SSC, & Police report °[facility] [place] °[address] °[place], [place] °336-881-5420 °Application Required ° °[facility] (men only)     °[address]      °[place], [place]     °[phone]      ° °[facility] °(Pregnant women only) °[address] °, [place] °336-275-0206 ° °[facility]      °[address]      °[place], [place] [address]     °[phone]      °       °[facility] °[address] °[place], [place] °336-723-1848 °90 day commitment/SA/Application process ° °[facility](men only)     °[address]     °[place], [place]     °[phone]       °Check-in at 7pm     °       °[facility] °[address] °[place], [place] [address] °336-248-6684 °Men/Women/Women and Children must be there by 7 pm ° °[facility] °[place], [place] °336-722-8721                ° °

## 2022-08-20 NOTE — ED Notes (Signed)
Patient resting quietly.  Will open eyes to voice.  Unable to recall events of last night.  Calm at this time.  Provided with warm blanket at this time

## 2022-08-20 NOTE — ED Notes (Signed)
Patient now calm and cooperative.  Resting with eyes closed.  Repositioned in bed for comfort.

## 2022-08-20 NOTE — ED Notes (Signed)
Pt given water, but not didn't drink it due to sleeping.

## 2022-08-23 ENCOUNTER — Encounter (HOSPITAL_COMMUNITY): Payer: Self-pay

## 2022-08-23 ENCOUNTER — Emergency Department (HOSPITAL_COMMUNITY)
Admission: EM | Admit: 2022-08-23 | Discharge: 2022-08-23 | Disposition: A | Discharge status: Home / Self Care | Payer: Commercial Managed Care - HMO | Attending: Emergency Medicine | Admitting: Emergency Medicine

## 2022-08-23 ENCOUNTER — Other Ambulatory Visit: Payer: Self-pay

## 2022-08-23 DIAGNOSIS — F1510 Other stimulant abuse, uncomplicated: Secondary | ICD-10-CM | POA: Diagnosis not present

## 2022-08-23 DIAGNOSIS — F411 Generalized anxiety disorder: Secondary | ICD-10-CM | POA: Diagnosis not present

## 2022-08-23 DIAGNOSIS — G4700 Insomnia, unspecified: Secondary | ICD-10-CM | POA: Insufficient documentation

## 2022-08-23 DIAGNOSIS — R000 Tachycardia, unspecified: Secondary | ICD-10-CM | POA: Insufficient documentation

## 2022-08-23 DIAGNOSIS — I10 Essential (primary) hypertension: Secondary | ICD-10-CM | POA: Insufficient documentation

## 2022-08-23 DIAGNOSIS — F419 Anxiety disorder, unspecified: Secondary | ICD-10-CM | POA: Diagnosis present

## 2022-08-23 MED ORDER — SERTRALINE HCL 50 MG PO TABS
50.0000 mg | ORAL_TABLET | Freq: Every day | ORAL | Status: DC
  Administered 2022-08-23: 50 mg via ORAL
  Filled 2022-08-23: qty 1

## 2022-08-23 MED ORDER — SERTRALINE HCL 50 MG PO TABS
50.0000 mg | ORAL_TABLET | Freq: Every day | ORAL | 0 refills | Status: AC
Start: 2022-08-23 — End: ?

## 2022-08-23 MED ORDER — LORAZEPAM 1 MG PO TABS
1.0000 mg | ORAL_TABLET | Freq: Once | ORAL | Status: AC
  Administered 2022-08-23: 1 mg via ORAL
  Filled 2022-08-23: qty 1

## 2022-08-23 MED ORDER — GABAPENTIN 300 MG PO CAPS
300.0000 mg | ORAL_CAPSULE | Freq: Once | ORAL | Status: AC
  Administered 2022-08-23: 300 mg via ORAL
  Filled 2022-08-23: qty 1

## 2022-08-23 NOTE — Discharge Instructions (Addendum)
Thank you for letting us take care of you today.  We gave you a dose of the Gabapentin and Zoloft you were previously prescribed in addition to Ativan to help with anxiety. Using GoodRx, I found coupons for your prescription medications. [facility] is cheapest at [facility] with coupon provided (prescription sent from last visit). Zoloft is cheapest at [facility] with coupon provided (new prescription sent to [facility] on paperwork). I provided copies of these.  You should not drive for 6 hours after receiving Ativan in ED tonight. If you develop any new or worsening symptoms such as thoughts of wanting to harm yourself or others, seeing or hearing things that are not there, confusion, or other new, concerning symptoms, please return to nearest ED for re-evaluation.

## 2022-08-23 NOTE — ED Triage Notes (Signed)
Patient BIB EMS for anxiety. Patient states he attempted to pick up his medication today, but was unable to as his Medicaid did not work. Patient reports insomnia and continued anxiety.

## 2022-08-24 NOTE — ED Provider Notes (Signed)
[place] EMERGENCY DEPARTMENT AT [facility] Provider Note   CSN: [id number] Arrival date & time: 08/23/22  2206     History  Chief Complaint  Patient presents with   Anxiety    Ricky Wu is a 37 y.o. male with PMH anxiety, ADHD, depression, HTN, who presents to ED c/p persistent anxiety and insomnia. Pt reports prescribed his regular medications but unable to pick them up due to problem with his Medicaid at the pharmacy. He reports feeling worried about things that are happening. He denies chest pain, shortness of breath, lightheadedness, dizziness, syncope, leg pain or swelling, suicidal, violent, or homicidal ideation, or auditory/visual hallucinations. He states he is currently using alcohol to cope with his anxiety and had about 5 beers today as well as used methamphetamine.       Home Medications Prior to Admission medications   Medication Sig Start Date End Date Taking? Authorizing Provider  buPROPion (WELLBUTRIN XL) 150 MG 24 hr tablet Take 1 tablet (150 mg total) by mouth daily. 08/13/22   Bing Neighbors, NP  gabapentin (NEURONTIN) 300 MG capsule Take 1 capsule (300 mg total) by mouth 3 (three) times daily. 08/13/22   Bing Neighbors, NP  hydrOXYzine (ATARAX) 25 MG tablet Take 1 tablet (25 mg total) by mouth 3 (three) times daily as needed for anxiety. 08/13/22   Bing Neighbors, NP  sertraline (ZOLOFT) 50 MG tablet Take 1 tablet (50 mg total) by mouth daily. 08/23/22   Nakeshia Waldeck, Dow Adolph L, PA-C      Allergies    Patient has no known allergies.    Review of Systems   Review of Systems  All other systems reviewed and are negative.   Physical Exam Updated Vital Signs BP (!) 140/106 (BP Location: Left Arm)   Pulse (!) 108   Temp 97.9 F (36.6 C) (Oral)   Resp 17   Ht 6\' 2"  (1.88 m)   Wt 81.6 kg   SpO2 100%   BMI 23.10 kg/m  Physical Exam Vitals and nursing note reviewed.  Constitutional:      General: He is not in acute distress.     Appearance: Normal appearance. He is not ill-appearing or toxic-appearing.  HENT:     Head: Normocephalic and atraumatic.     Mouth/Throat:     Mouth: Mucous membranes are moist.  Eyes:     Conjunctiva/sclera: Conjunctivae normal.  Cardiovascular:     Rate and Rhythm: Regular rhythm. Tachycardia present.     Heart sounds: No murmur heard. Pulmonary:     Effort: Pulmonary effort is normal. No respiratory distress.     Breath sounds: Normal breath sounds. No stridor. No wheezing, rhonchi or rales.  Abdominal:     General: Abdomen is flat. There is no distension.     Palpations: Abdomen is soft.     Tenderness: There is no abdominal tenderness. There is no guarding or rebound.  Musculoskeletal:        General: Normal range of motion.     Cervical back: Normal range of motion and neck supple. No rigidity.     Right lower leg: No edema.     Left lower leg: No edema.  Skin:    General: Skin is warm and dry.     Capillary Refill: Capillary refill takes less than 2 seconds.     Coloration: Skin is not jaundiced or pale.     Findings: No rash.  Neurological:     General: No  focal deficit present.     Mental Status: He is alert and oriented to person, place, and time.  Psychiatric:        Attention and Perception: He does not perceive auditory or visual hallucinations.        Mood and Affect: Mood is anxious. Affect is labile and blunt.        Speech: He is communicative. Speech is rapid and pressured. Speech is not delayed, slurred or tangential.        Behavior: Behavior is not agitated, slowed, aggressive, hyperactive or combative. Behavior is cooperative.        Thought Content: Thought content is not delusional. Thought content does not include homicidal or suicidal ideation. Thought content does not include homicidal or suicidal plan.     ED Results / Procedures / Treatments   Labs (all labs ordered are listed, but only abnormal results are displayed) Labs Reviewed - No data to  display  EKG None  Radiology No results found.  Procedures Procedures    Medications Ordered in ED Medications  gabapentin (NEURONTIN) capsule 300 mg (300 mg Oral Given 08/23/22 2326)  LORazepam (ATIVAN) tablet 1 mg (1 mg Oral Given 08/23/22 2325)    ED Course/ Medical Decision Making/ A&P                             Medical Decision Making Risk Prescription drug management.   Medical Decision Making:   Ricky Wu is a 37 y.o. male who presented to the ED today with anxiety detailed above.    Patient's presentation is complicated by their history of substance abuse, off medications, multiple comorbidities.  Complete initial physical exam performed, notably the patient was in NAD. Tachycardic with regular rhythm. LCTA. Anxious with blunt affect. Rapid and pressured speech but alert and oriented. Does not appear to be responding to internal stimuli. No SI or HI.    Reviewed and confirmed nursing documentation for past medical history, family history, social history.    Initial Assessment:   With the patient's presentation, differential diagnosis includes but is not limited to ACS, arrhythmia, anxiety, panic disorder, manic episode, depressive episode, polysubstance abuse, alcohol abuse, alcohol withdrawal, metabolic disturbance.   This is most consistent with an acute complicated illness  Initial Plan:  Labs/imaging/social work consult, all further workup declined by patient Medication management Objective evaluation as below reviewed   Final Assessment and Plan:   37 year old male presents to ED for evaluation of anxiety. He reports unable to get prescriptions due to Medicaid issue and off chronic medications including anti-depressant, Gabapentin. Reports increased anxiety and insomnia. Reports self-medicating with meth and alcohol. Tachycardic with rapid and pressured speech. Nonfocal neuro exam. Answering questions appropriately. Has decision making capacity.   Non-tremulous, apart from tachycardia no signs of possible withdrawal and tachycardia may be due to anxiety. Offered further workup to pt who declined. Offered assistance with finding affordable medication options. Pt provided with GoodRx coupons for medication. Zoloft prescription sent in to cheapest pharmacy at previous dose. Pt declined social work consult for medication assistance/substance abuse resources. Provided with resource guides as attached. Pt stated he wanted to leave ED to smoke. Discharged in stable condition. Given strict ED return precautions.    Clinical Impression:  1. Anxiety   2. GAD (generalized anxiety disorder)   3. Methamphetamine abuse [facility])      Discharge  Final Clinical Impression(s) / ED Diagnoses Final diagnoses:  Anxiety  Methamphetamine abuse (HCC)    Rx / DC Orders ED Discharge Orders          Ordered    sertraline (ZOLOFT) 50 MG tablet  Daily        08/23/22 2255              Richardson Dopp 08/24/22 2152    Charlynne Pander, MD 08/24/22 2306

## 2022-09-01 ENCOUNTER — Emergency Department (HOSPITAL_COMMUNITY)
Admission: EM | Admit: 2022-09-01 | Discharge: 2022-09-01 | Disposition: A | Discharge status: Home / Self Care | Payer: Commercial Managed Care - HMO | Attending: Emergency Medicine | Admitting: Emergency Medicine

## 2022-09-01 ENCOUNTER — Encounter (HOSPITAL_COMMUNITY): Payer: Self-pay | Admitting: Emergency Medicine

## 2022-09-01 ENCOUNTER — Other Ambulatory Visit: Payer: Self-pay

## 2022-09-01 DIAGNOSIS — M79672 Pain in left foot: Secondary | ICD-10-CM | POA: Insufficient documentation

## 2022-09-01 DIAGNOSIS — M79671 Pain in right foot: Secondary | ICD-10-CM | POA: Diagnosis present

## 2022-09-01 MED ORDER — GABAPENTIN 300 MG PO CAPS
300.0000 mg | ORAL_CAPSULE | Freq: Once | ORAL | Status: AC
  Administered 2022-09-01: 300 mg via ORAL
  Filled 2022-09-01: qty 1

## 2022-09-01 MED ORDER — SERTRALINE HCL 50 MG PO TABS
50.0000 mg | ORAL_TABLET | ORAL | Status: AC
  Administered 2022-09-01: 50 mg via ORAL
  Filled 2022-09-01: qty 1

## 2022-09-01 NOTE — ED Provider Notes (Signed)
[place] EMERGENCY DEPARTMENT AT [facility] Provider Note   CSN: [id number] Arrival date & time: 09/01/22  0028     History  Chief Complaint  Patient presents with   Foot Pain    Ricky Wu is a 37 y.o. male.  The history is provided by the patient.  Foot Pain This is a chronic problem. The current episode started more than 1 week ago. The problem occurs constantly. The problem has not changed since onset.Pertinent negatives include no chest pain, no abdominal pain, no headaches and no shortness of breath. Nothing aggravates the symptoms. Nothing relieves the symptoms. He has tried nothing for the symptoms. The treatment provided no relief.  Patient has callouses and pain secondary to ambulation.       Home Medications Prior to Admission medications   Medication Sig Start Date End Date Taking? Authorizing Provider  buPROPion (WELLBUTRIN XL) 150 MG 24 hr tablet Take 1 tablet (150 mg total) by mouth daily. 08/13/22   Bing Neighbors, NP  gabapentin (NEURONTIN) 300 MG capsule Take 1 capsule (300 mg total) by mouth 3 (three) times daily. 08/13/22   Bing Neighbors, NP  hydrOXYzine (ATARAX) 25 MG tablet Take 1 tablet (25 mg total) by mouth 3 (three) times daily as needed for anxiety. 08/13/22   Bing Neighbors, NP  sertraline (ZOLOFT) 50 MG tablet Take 1 tablet (50 mg total) by mouth daily. 08/23/22   Gowens, Dow Adolph L, PA-C      Allergies    Patient has no known allergies.    Review of Systems   Review of Systems  Constitutional:  Negative for fever.  HENT:  Negative for congestion and drooling.   Eyes:  Negative for redness.  Respiratory:  Negative for shortness of breath.   Cardiovascular:  Negative for chest pain.  Gastrointestinal:  Negative for abdominal pain.  Neurological:  Negative for headaches.  All other systems reviewed and are negative.   Physical Exam Updated Vital Signs BP (!) 155/121 (BP Location: Left Arm)   Pulse (!) 118   Temp  98 F (36.7 C) (Oral)   Resp 20   Ht 6\' 2"  (1.88 m)   Wt 81.2 kg   SpO2 97%   BMI 22.98 kg/m  Physical Exam Vitals and nursing note reviewed.  Constitutional:      General: He is not in acute distress.    Appearance: Normal appearance. He is well-developed. He is not diaphoretic.  HENT:     Head: Normocephalic and atraumatic.     Nose: Nose normal.  Eyes:     Conjunctiva/sclera: Conjunctivae normal.     Pupils: Pupils are equal, round, and reactive to light.  Cardiovascular:     Rate and Rhythm: Normal rate and regular rhythm.  Pulmonary:     Effort: Pulmonary effort is normal.     Breath sounds: Normal breath sounds. No wheezing or rales.  Abdominal:     General: Bowel sounds are normal.     Palpations: Abdomen is soft.     Tenderness: There is no abdominal tenderness. There is no guarding or rebound.  Musculoskeletal:        General: Normal range of motion.     Cervical back: Normal range of motion and neck supple.       Feet:     Comments: 3+ dorsalis pedis patient is ambulating without difficulty.  No skin breaskdown    Skin:    General: Skin is warm and dry.  Capillary Refill: Capillary refill takes less than 2 seconds.  Neurological:     General: No focal deficit present.     Mental Status: He is alert and oriented to person, place, and time.     Deep Tendon Reflexes: Reflexes normal.  Psychiatric:        Mood and Affect: Mood normal.        Behavior: Behavior normal.     ED Results / Procedures / Treatments   Labs (all labs ordered are listed, but only abnormal results are displayed) Labs Reviewed - No data to display  EKG None  Radiology No results found.  Procedures Procedures    Medications Ordered in ED Medications  sertraline (ZOLOFT) tablet 50 mg (has no administration in time range)    ED Course/ Medical Decision Making/ A&P                             Medical Decision Making Patient with ongoing foot pain.  Also needs a dose of  his zoloft.  Is eating and drinking in the room   Amount and/or Complexity of Data Reviewed Independent Historian: EMS    Details: See above  External Data Reviewed: notes.    Details: Previous notes reviewed   Risk Prescription drug management. Risk Details: Foot issues are chronic and patient is stable for discharge. Dose of home medication given.  No SI or HI no AH or VH stable for discharge.      Final Clinical Impression(s) / ED Diagnoses Final diagnoses:  Foot pain, right  Foot pain, left   Return for intractable cough, coughing up blood, fevers > 100.4 unrelieved by medication, shortness of breath, intractable vomiting, chest pain, shortness of breath, weakness, numbness, changes in speech, facial asymmetry, abdominal pain, passing out, Inability to tolerate liquids or food, cough, altered mental status or any concerns. No signs of systemic illness or infection. The patient is nontoxic-appearing on exam and vital signs are within normal limits.  I have reviewed the triage vital signs and the nursing notes. Pertinent labs & imaging results that were available during my care of the patient were reviewed by me and considered in my medical decision making (see chart for details). After history, exam, and medical workup I feel the patient has been appropriately medically screened and is safe for discharge home. Pertinent diagnoses were discussed with the patient. Patient was given return precautions   Rx / DC Orders ED Discharge Orders     None         Donnovan Stamour, MD 09/01/22 4098

## 2022-09-01 NOTE — ED Notes (Signed)
  Patient refused post op shoes

## 2022-09-01 NOTE — ED Triage Notes (Addendum)
  Patient BIB [facility] for bilateral foot pain.  Patient states he has trench foot and having a hard time ambulating.  Patient endorses drinking several beers and using meth this evening.  Pain 8/10.

## 2022-09-01 NOTE — ED Notes (Signed)
  Patient requesting food and drink.  Told he has to wait to see EDP first.

## 2022-10-07 ENCOUNTER — Encounter (HOSPITAL_COMMUNITY): Payer: Self-pay

## 2022-10-07 ENCOUNTER — Emergency Department (HOSPITAL_COMMUNITY)
Admission: EM | Admit: 2022-10-07 | Discharge: 2022-10-07 | Disposition: A | Discharge status: Home / Self Care | Payer: Commercial Managed Care - HMO | Source: Home / Self Care | Attending: Emergency Medicine | Admitting: Emergency Medicine

## 2022-10-07 DIAGNOSIS — F419 Anxiety disorder, unspecified: Secondary | ICD-10-CM | POA: Insufficient documentation

## 2022-10-07 DIAGNOSIS — I10 Essential (primary) hypertension: Secondary | ICD-10-CM | POA: Diagnosis not present

## 2022-10-07 DIAGNOSIS — F1910 Other psychoactive substance abuse, uncomplicated: Secondary | ICD-10-CM | POA: Diagnosis not present

## 2022-10-07 MED ORDER — LORAZEPAM 1 MG PO TABS
1.0000 mg | ORAL_TABLET | Freq: Once | ORAL | Status: AC
  Administered 2022-10-07: 1 mg via ORAL
  Filled 2022-10-07: qty 1

## 2022-10-07 NOTE — Discharge Instructions (Signed)
You are seen today for worsening anxiety.  You are given something for anxiety.  You should avoid illicit drug use and alcohol use.  You were provided resources for outpatient management regarding both your drug abuse and psychiatric services.

## 2022-10-07 NOTE — ED Triage Notes (Signed)
Pt arrived POV for anxiety & panic attack, pt reports recently started Zolft & Gabapentin and feels the medication is not working, has stressors as his mom is in a SNF and not doing well. Last alcoholic drinks was 2 hrs PTA, last IV meth use was yesterday evening.

## 2022-10-07 NOTE — ED Provider Notes (Signed)
Ricky Wu Provider Note   CSN: [id number] Arrival date & time: 10/07/22  0114     History  Chief Complaint  Patient presents with   Anxiety    Ricky Wu is a 37 y.o. male.  HPI     This is a 37 year old male who presents with concerns for increasing anxiety.  Reports history of generalized anxiety disorder.  He just started Zoloft and gabapentin.  He states that he is visiting his mother up here in SNF and does not have a place to stay.  This is increasing his anxiety.  He does not have any SI or HI.  Does endorse ongoing drug use and alcohol use which "I know is not helping anything."  Last alcohol use prior to arrival.  Last methamphetamine use yesterday.  Home Medications Prior to Admission medications   Medication Sig Start Date End Date Taking? Authorizing Provider  buPROPion (WELLBUTRIN XL) 150 MG 24 hr tablet Take 1 tablet (150 mg total) by mouth daily. 08/13/22   Bing Neighbors, NP  gabapentin (NEURONTIN) 300 MG capsule Take 1 capsule (300 mg total) by mouth 3 (three) times daily. 08/13/22   Bing Neighbors, NP  hydrOXYzine (ATARAX) 25 MG tablet Take 1 tablet (25 mg total) by mouth 3 (three) times daily as needed for anxiety. 08/13/22   Bing Neighbors, NP  sertraline (ZOLOFT) 50 MG tablet Take 1 tablet (50 mg total) by mouth daily. 08/23/22   Gowens, Dow Adolph L, PA-C      Allergies    Patient has no known allergies.    Review of Systems   Review of Systems  Psychiatric/Behavioral:  Negative for suicidal ideas. The patient is nervous/anxious.   All other systems reviewed and are negative.   Physical Exam Updated Vital Signs BP (!) 145/108   Pulse 97   Temp 98.1 F (36.7 C) (Oral)   Resp 16   Ht 1.88 m (6\' 2" )   Wt 79.4 kg   SpO2 100%   BMI 22.47 kg/m  Physical Exam Vitals and nursing note reviewed.  Constitutional:      Appearance: He is well-developed. He is not ill-appearing.  HENT:     Head:  Normocephalic and atraumatic.  Eyes:     Pupils: Pupils are equal, round, and reactive to light.  Cardiovascular:     Rate and Rhythm: Normal rate and regular rhythm.  Pulmonary:     Effort: Pulmonary effort is normal. No respiratory distress.  Abdominal:     Palpations: Abdomen is soft.  Musculoskeletal:     Cervical back: Neck supple.  Lymphadenopathy:     Cervical: No cervical adenopathy.  Skin:    General: Skin is warm and dry.  Neurological:     Mental Status: He is alert and oriented to person, place, and time.  Psychiatric:     Comments: Anxious but nontoxic     ED Results / Procedures / Treatments   Labs (all labs ordered are listed, but only abnormal results are displayed) Labs Reviewed - No data to display  EKG None  Radiology No results found.  Procedures Procedures    Medications Ordered in ED Medications  LORazepam (ATIVAN) tablet 1 mg (1 mg Oral Given 10/07/22 0200)    ED Course/ Medical Decision Making/ A&P                             Medical  Decision Making Risk Prescription drug management.   This patient presents to the ED for concern of anxiety, this involves an extensive number of treatment options, and is a complaint that carries with it a high risk of complications and morbidity.  I considered the following differential and admission for this acute, potentially life threatening condition.  The differential diagnosis includes generalized anxiety, panic attack, increased social stressors  MDM:    This is a 37 year old male who presents with concern for increasing anxiety.  Currently homeless.  Has been self-medicating with alcohol and drugs.  He is nontoxic.  Slightly tachycardic.  He denies SI or HI.  He is not currently following with a psychiatrist.  Discussed with him that I will give him a dose of Ativan.  Ideally he needs to establish psychiatric care as an outpatient.  Will give resources.  Additionally we discussed the downfalls of  alcohol and drug abuse especially in the setting of ongoing anxiety.  (Labs, imaging, consults)  Labs: I Ordered, and personally interpreted labs.  The pertinent results include: None  Imaging Studies ordered: I ordered imaging studies including none I independently visualized and interpreted imaging. I agree with the radiologist interpretation  Additional history obtained from chart review.  External records from outside source obtained and reviewed including prior evaluations  Cardiac Monitoring: The patient was maintained on a cardiac monitor.  If on the cardiac monitor, I personally viewed and interpreted the cardiac monitored which showed an underlying rhythm of: Sinus rhythm  Reevaluation: After the interventions noted above, I reevaluated the patient and found that they have :improved  Social Determinants of Health:  housing instability  Disposition: Discharge  Co morbidities that complicate the patient evaluation  Past Medical History:  Diagnosis Date   ADHD (attention deficit hyperactivity disorder)    Anhedonia    Anxiety    Anxiety disorder    Confusion    Depression    Disorganized thought process    GERD (gastroesophageal reflux disease)    Hypertension    Penis disorder    Sleep apnea    mild no cpap or bipap     Medicines Meds ordered this encounter  Medications   LORazepam (ATIVAN) tablet 1 mg    I have reviewed the patients home medicines and have made adjustments as needed  Problem List / ED Course: Problem List Items Addressed This Visit   None Visit Diagnoses     Anxiety    -  Primary   Relevant Medications   LORazepam (ATIVAN) tablet 1 mg (Completed)   Polysubstance abuse (HCC)                       Final Clinical Impression(s) / ED Diagnoses Final diagnoses:  Anxiety  Polysubstance abuse (HCC)    Rx / DC Orders ED Discharge Orders     None         Kameren Baade, Mayer Masker, MD 10/07/22 [phone]

## 2022-10-10 ENCOUNTER — Ambulatory Visit (HOSPITAL_COMMUNITY)
Admission: EM | Admit: 2022-10-10 | Discharge: 2022-10-10 | Discharge status: Against Medical Advice | Payer: Commercial Managed Care - HMO
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
## 2022-10-10 NOTE — BH Assessment (Addendum)
The patient finished the initial triage and was directed to the waiting area to be seen by the [facility] provider for a comprehensive medical evaluation. After waiting for a while, the patient approached the registration desk and expressed a desire to retrieve personal items, indicating a need to leave promptly. This Clinician advised/encouraged the patient to remain and meet with the provider, but the patient declined and insisted on leaving. Security assisted in returning the patient's personal belongings, after which the patient left the premises. During triage, the patient had signed a Waiver of Medical Screening Exam form.

## 2022-10-20 ENCOUNTER — Encounter (HOSPITAL_COMMUNITY): Payer: Self-pay

## 2022-10-20 ENCOUNTER — Other Ambulatory Visit: Payer: Self-pay

## 2022-10-20 ENCOUNTER — Emergency Department (HOSPITAL_COMMUNITY)
Admission: EM | Admit: 2022-10-20 | Discharge: 2022-10-20 | Disposition: A | Discharge status: Home / Self Care | Payer: Commercial Managed Care - HMO | Attending: Emergency Medicine | Admitting: Emergency Medicine

## 2022-10-20 ENCOUNTER — Emergency Department (HOSPITAL_COMMUNITY): Payer: Commercial Managed Care - HMO

## 2022-10-20 DIAGNOSIS — S2242XA Multiple fractures of ribs, left side, initial encounter for closed fracture: Secondary | ICD-10-CM | POA: Diagnosis not present

## 2022-10-20 DIAGNOSIS — F419 Anxiety disorder, unspecified: Secondary | ICD-10-CM | POA: Insufficient documentation

## 2022-10-20 DIAGNOSIS — R0781 Pleurodynia: Secondary | ICD-10-CM | POA: Diagnosis present

## 2022-10-20 MED ORDER — HYDROXYZINE HCL 25 MG PO TABS: 25.0000 mg | ORAL_TABLET | Freq: Once | ORAL | Status: DC

## 2022-10-20 NOTE — ED Notes (Signed)
Sandwich and sprite given. 

## 2022-10-20 NOTE — ED Notes (Signed)
Incentive spirometer provided with instructions

## 2022-10-20 NOTE — Discharge Instructions (Signed)
Please use the provided incentive spirometer hourly for the next few days then every few hours for the next few weeks.  This will help maintain your lung function.  You may take over-the-counter medication as needed for your rib fractures.  Please follow-up with behavioral health for further evaluation of your underlying anxiety.
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
## 2022-11-06 ENCOUNTER — Emergency Department (HOSPITAL_COMMUNITY)
Admission: EM | Admit: 2022-11-06 | Discharge: 2022-11-06 | Disposition: A | Discharge status: Home / Self Care | Payer: Commercial Managed Care - HMO | Attending: Emergency Medicine | Admitting: Emergency Medicine

## 2022-11-06 ENCOUNTER — Other Ambulatory Visit: Payer: Self-pay

## 2022-11-06 ENCOUNTER — Encounter (HOSPITAL_COMMUNITY): Payer: Self-pay

## 2022-11-06 DIAGNOSIS — F419 Anxiety disorder, unspecified: Secondary | ICD-10-CM

## 2022-11-06 DIAGNOSIS — E860 Dehydration: Secondary | ICD-10-CM | POA: Insufficient documentation

## 2022-11-06 DIAGNOSIS — F1010 Alcohol abuse, uncomplicated: Secondary | ICD-10-CM

## 2022-11-06 DIAGNOSIS — R000 Tachycardia, unspecified: Secondary | ICD-10-CM | POA: Insufficient documentation

## 2022-11-06 DIAGNOSIS — F1510 Other stimulant abuse, uncomplicated: Secondary | ICD-10-CM | POA: Insufficient documentation

## 2022-11-06 LAB — I-STAT CHEM 8, ED
BUN: 21 mg/dL — ABNORMAL HIGH (ref 6–20)
Calcium, Ion: 1.13 mmol/L — ABNORMAL LOW (ref 1.15–1.40)
Chloride: 100 mmol/L (ref 98–111)
Creatinine, Ser: 0.8 mg/dL (ref 0.61–1.24)
Glucose, Bld: 109 mg/dL — ABNORMAL HIGH (ref 70–99)
HCT: 49 % (ref 39.0–52.0)
Hemoglobin: 16.7 g/dL (ref 13.0–17.0)
Potassium: 4.4 mmol/L (ref 3.5–5.1)
Sodium: 136 mmol/L (ref 135–145)
TCO2: 27 mmol/L (ref 22–32)

## 2022-11-06 MED ORDER — LORAZEPAM 2 MG/ML IJ SOLN
1.0000 mg | Freq: Once | INTRAMUSCULAR | Status: AC
  Administered 2022-11-06: 1 mg via INTRAVENOUS
  Filled 2022-11-06: qty 1

## 2022-11-06 MED ORDER — LACTATED RINGERS IV BOLUS
1000.0000 mL | Freq: Once | INTRAVENOUS | Status: AC
  Administered 2022-11-06: 1000 mL via INTRAVENOUS

## 2022-11-06 MED ORDER — CHLORDIAZEPOXIDE HCL 25 MG PO CAPS: ORAL_CAPSULE | ORAL | 0 refills | Status: AC

## 2022-11-06 NOTE — ED Provider Notes (Signed)
[place] EMERGENCY DEPARTMENT AT [facility] Provider Note   CSN: [id number] Arrival date & time: 11/06/22  4742     History  Chief Complaint  Patient presents with   Anxiety    Ricky Wu is a 37 y.o. male.  HPI 37 year old male history of anxiety disorder and depression presents today stating that he feels like his anxiety has been worsened.  He has been drinking alcohol and taking methamphetamines over the past 24 hours.  He states that he feels anxious.  His last alcohol intake was 2 AM.  He says it does drink daily.  He denies fever, chills, chest pain, nausea vomiting or diarrhea.  He has had decreased p.o. intake.     Home Medications Prior to Admission medications   Medication Sig Start Date End Date Taking? Authorizing Provider  chlordiazePOXIDE (LIBRIUM) 25 MG capsule 50mg  PO TID x 1D, then 25-50mg  PO BID X 1D, then 25-50mg  PO QD X 1D 11/06/22  Yes Kenora Spayd, Duwayne Heck, MD  buPROPion (WELLBUTRIN XL) 150 MG 24 hr tablet Take 1 tablet (150 mg total) by mouth daily. 08/13/22   Bing Neighbors, NP  gabapentin (NEURONTIN) 300 MG capsule Take 1 capsule (300 mg total) by mouth 3 (three) times daily. 08/13/22   Bing Neighbors, NP  hydrOXYzine (ATARAX) 25 MG tablet Take 1 tablet (25 mg total) by mouth 3 (three) times daily as needed for anxiety. 08/13/22   Bing Neighbors, NP  sertraline (ZOLOFT) 50 MG tablet Take 1 tablet (50 mg total) by mouth daily. 08/23/22   Gowens, Dow Adolph L, PA-C      Allergies    Patient has no known allergies.    Review of Systems   Review of Systems  Physical Exam Updated Vital Signs BP (!) 153/107 Comment: nurse notified  Pulse (!) 111   Temp 98.4 F (36.9 C) (Oral)   Resp 18   Ht 1.88 m (6\' 2" )   Wt 81.6 kg   SpO2 100%   BMI 23.11 kg/m  Physical Exam Vitals and nursing note reviewed.  Constitutional:      Comments: Unkempt  HENT:     Head: Normocephalic.     Right Ear: External ear normal.     Left Ear: External ear  normal.     Nose: Nose normal.     Mouth/Throat:     Mouth: Mucous membranes are dry.  Eyes:     Pupils: Pupils are equal, round, and reactive to light.  Cardiovascular:     Rate and Rhythm: Regular rhythm. Tachycardia present.  Pulmonary:     Effort: Pulmonary effort is normal.     Breath sounds: Normal breath sounds.  Abdominal:     General: Abdomen is flat.     Palpations: Abdomen is soft.  Musculoskeletal:        General: Normal range of motion.     Cervical back: Normal range of motion.  Skin:    General: Skin is warm.     Capillary Refill: Capillary refill takes less than 2 seconds.  Neurological:     General: No focal deficit present.     Mental Status: He is alert.  Psychiatric:        Mood and Affect: Mood normal.     ED Results / Procedures / Treatments   Labs (all labs ordered are listed, but only abnormal results are displayed) Labs Reviewed  I-STAT CHEM 8, ED - Abnormal; Notable for the following components:  Result Value   BUN 21 (*)    Glucose, Bld 109 (*)    Calcium, Ion 1.13 (*)    All other components within normal limits    EKG EKG Interpretation Date/Time:  Sunday November 06 2022 07:05:21 EDT Ventricular Rate:  119 PR Interval:  178 QRS Duration:  91 QT Interval:  312 QTC Calculation: 439 R Axis:   55  Text Interpretation: Sinus tachycardia Confirmed by Layne Dilauro ([id number]) on 11/06/2022 7:59:12 AM  Radiology No results found.  Procedures Procedures    Medications Ordered in ED Medications  lactated ringers bolus 1,000 mL (1,000 mLs Intravenous New Bag/Given 11/06/22 0822)  LORazepam (ATIVAN) injection 1 mg (1 mg Intravenous Given 11/06/22 0823)  LORazepam (ATIVAN) injection 1 mg (1 mg Intravenous Given 11/06/22 0920)    ED Course/ Medical Decision Making/ A&P Clinical Course as of 11/06/22 0921  Sun Nov 06, 2022  0831 I-STAT reviewed interpreted mildly decreased ionized calcium otherwise within normal limits [DR]    Clinical  Course User Index [DR] Reniyah Gootee, MD                                 Medical Decision Making Risk Prescription drug management.   37 year old male history of anxiety presents today with some tachycardia.  Patient gives history of recent methamphetamine and alcohol use.  He does drink daily and is at risk of withdrawal.  Patient is tachycardic and hypertensive here. Plan IV fluids, IV Ativan, and will check i-STAT Patient voices no homicidal suicidal ideation or psychotic features.  He states he does have family that could likely come and get him. Patient takint po well BP and hr decreased Will give additional dose ativan Tolerating liquids well.  Heart rate has decreased.  Blood pressure has decreased. We have discussed outpatient detox resources.  Will give Librium. Patient is not entirely clear whether he wishes to stop at this time but thinks he may want to give it a try.  We discussed outpatient follow-up for anxiety, cessation of methamphetamines, and possible detox from alcohol. Patient appears stable for discharge        Final Clinical Impression(s) / ED Diagnoses Final diagnoses:  Anxiety  Dehydration  Alcohol abuse  Methamphetamine abuse (HCC)    Rx / DC Orders ED Discharge Orders          Ordered    chlordiazePOXIDE (LIBRIUM) 25 MG capsule        08 /11/24 0921              Margarita Grizzle, MD 11/06/22 [phone]

## 2022-11-06 NOTE — ED Triage Notes (Signed)
Pt states he has been very anxious since yesterday. Pt states he is short of breath and exhausted bc his main method of transportation is walking. Pt is supposed to take zoloft but hasn't taken it in the last month bc he lost his meds.

## 2022-12-25 ENCOUNTER — Other Ambulatory Visit: Payer: Self-pay

## 2022-12-25 ENCOUNTER — Emergency Department (HOSPITAL_COMMUNITY)
Admission: EM | Admit: 2022-12-25 | Discharge: 2022-12-25 | Disposition: A | Discharge status: Home / Self Care | Payer: Commercial Managed Care - HMO | Attending: Emergency Medicine | Admitting: Emergency Medicine

## 2022-12-25 DIAGNOSIS — R400 Somnolence: Secondary | ICD-10-CM | POA: Diagnosis not present

## 2022-12-25 DIAGNOSIS — Y905 Blood alcohol level of 100-119 mg/100 ml: Secondary | ICD-10-CM | POA: Insufficient documentation

## 2022-12-25 DIAGNOSIS — F10920 Alcohol use, unspecified with intoxication, uncomplicated: Secondary | ICD-10-CM | POA: Insufficient documentation

## 2022-12-25 LAB — CBC WITH DIFFERENTIAL/PLATELET
Abs Immature Granulocytes: 0.03 10*3/uL (ref 0.00–0.07)
Basophils Absolute: 0 10*3/uL (ref 0.0–0.1)
Basophils Relative: 0 %
Eosinophils Absolute: 0.2 10*3/uL (ref 0.0–0.5)
Eosinophils Relative: 2 %
HCT: 33.8 % — ABNORMAL LOW (ref 39.0–52.0)
Hemoglobin: 11.3 g/dL — ABNORMAL LOW (ref 13.0–17.0)
Immature Granulocytes: 0 %
Lymphocytes Relative: 23 %
Lymphs Abs: 2 10*3/uL (ref 0.7–4.0)
MCH: 32.4 pg (ref 26.0–34.0)
MCHC: 33.4 g/dL (ref 30.0–36.0)
MCV: 96.8 fL (ref 80.0–100.0)
Monocytes Absolute: 1.1 10*3/uL — ABNORMAL HIGH (ref 0.1–1.0)
Monocytes Relative: 12 %
Neutro Abs: 5.5 10*3/uL (ref 1.7–7.7)
Neutrophils Relative %: 63 %
Platelets: 223 10*3/uL (ref 150–400)
RBC: 3.49 MIL/uL — ABNORMAL LOW (ref 4.22–5.81)
RDW: 13.6 % (ref 11.5–15.5)
WBC: 8.7 10*3/uL (ref 4.0–10.5)
nRBC: 0 % (ref 0.0–0.2)

## 2022-12-25 LAB — BASIC METABOLIC PANEL
Anion gap: 10 (ref 5–15)
BUN: 8 mg/dL (ref 6–20)
CO2: 23 mmol/L (ref 22–32)
Calcium: 7.9 mg/dL — ABNORMAL LOW (ref 8.9–10.3)
Chloride: 102 mmol/L (ref 98–111)
Creatinine, Ser: 0.58 mg/dL — ABNORMAL LOW (ref 0.61–1.24)
GFR, Estimated: >60 mL/min (ref >60)
Glucose, Bld: 83 mg/dL (ref 70–99)
Potassium: 3.3 mmol/L — ABNORMAL LOW (ref 3.5–5.1)
Sodium: 135 mmol/L (ref 135–145)

## 2022-12-25 LAB — ETHANOL: Alcohol, Ethyl (B): 115 mg/dL — ABNORMAL HIGH (ref <10)

## 2022-12-25 NOTE — ED Notes (Signed)
This pt was discharged by previous shift. This nurse did not set eyes on pt. Discharging out of system to clean up.

## 2022-12-25 NOTE — ED Triage Notes (Signed)
[facility] state they found patient on [address] passed out. Gave him .5mg  of narcan IM. Patient is now arousable. He also got NS. Pt states he is drunk but denies any use of drugs. He is currently A&OX4. He has a Hx of drug use.   BP 116/72  HR 70 RR 16  CBG 190

## 2022-12-25 NOTE — Discharge Instructions (Signed)
Return for any problem.  ?

## 2022-12-25 NOTE — ED Provider Notes (Addendum)
[place] EMERGENCY DEPARTMENT AT [facility] Provider Note   CSN: [id number] Arrival date & time: 12/25/22  1323     History  Chief Complaint  Patient presents with   Drug Overdose    Ricky Wu is a 37 y.o. male.  37 year old male with prior medical history as detailed below presents for evaluation.  Patient was found some unresponsive near [address] [place].  EMS apparently administered 0.5 mg of Narcan IM.  This improved his mentation.  On arrival to the ED the patient reports recent alcohol consumption.  He denies other drug use.  Patient is otherwise without complaint.  The history is provided by the patient and medical records.       Home Medications Prior to Admission medications   Medication Sig Start Date End Date Taking? Authorizing Provider  buPROPion (WELLBUTRIN XL) 150 MG 24 hr tablet Take 1 tablet (150 mg total) by mouth daily. 08/13/22   Bing Neighbors, NP  chlordiazePOXIDE (LIBRIUM) 25 MG capsule 50mg  PO TID x 1D, then 25-50mg  PO BID X 1D, then 25-50mg  PO QD X 1D 11/06/22   Margarita Grizzle, MD  gabapentin (NEURONTIN) 300 MG capsule Take 1 capsule (300 mg total) by mouth 3 (three) times daily. 08/13/22   Bing Neighbors, NP  hydrOXYzine (ATARAX) 25 MG tablet Take 1 tablet (25 mg total) by mouth 3 (three) times daily as needed for anxiety. 08/13/22   Bing Neighbors, NP  sertraline (ZOLOFT) 50 MG tablet Take 1 tablet (50 mg total) by mouth daily. 08/23/22   Gowens, Dow Adolph L, PA-C      Allergies    Patient has no known allergies.    Review of Systems   Review of Systems  All other systems reviewed and are negative.   Physical Exam Updated Vital Signs BP 122/89 (BP Location: Right Arm)   Pulse 78   Temp 98.2 F (36.8 C) (Oral)   Resp 20   SpO2 98%  Physical Exam Vitals and nursing note reviewed.  Constitutional:      General: He is not in acute distress.    Appearance: Normal appearance. He is well-developed.     Comments:  Alert, mildly somnolent, appears intoxicated  HENT:     Head: Normocephalic and atraumatic.  Eyes:     Conjunctiva/sclera: Conjunctivae normal.     Pupils: Pupils are equal, round, and reactive to light.  Cardiovascular:     Rate and Rhythm: Normal rate and regular rhythm.     Heart sounds: Normal heart sounds.  Pulmonary:     Effort: Pulmonary effort is normal. No respiratory distress.     Breath sounds: Normal breath sounds.  Abdominal:     General: There is no distension.     Palpations: Abdomen is soft.     Tenderness: There is no abdominal tenderness.  Musculoskeletal:        General: No deformity. Normal range of motion.     Cervical back: Normal range of motion and neck supple.  Skin:    General: Skin is warm and dry.  Neurological:     General: No focal deficit present.     Mental Status: He is alert and oriented to person, place, and time.     ED Results / Procedures / Treatments   Labs (all labs ordered are listed, but only abnormal results are displayed) Labs Reviewed  ETHANOL  CBC WITH DIFFERENTIAL/PLATELET  BASIC METABOLIC PANEL    EKG None  Radiology No results  found.  Procedures Procedures    Medications Ordered in ED Medications - No data to display  ED Course/ Medical Decision Making/ A&P                                 Medical Decision Making Amount and/or Complexity of Data Reviewed Labs: ordered.    Medical Screen Complete  This patient presented to the ED with complaint of AMS.  This complaint involves an extensive number of treatment options. The initial differential diagnosis includes, but is not limited to, likely EtOH intoxication  This presentation is: Acute, Chronic, Self-Limited, Previously Undiagnosed, Uncertain Prognosis, Complicated, Systemic Symptoms, and Threat to Life/Bodily Function  Patient presents with likely alcohol intoxication.  Obtain screening labs are without significant abnormality.  Patient observed  in the ED for several hours.  At time of discharge he is improved.  Importance of close follow-up is stressed.  Strict return precautions given and understood.  Additional history obtained:  External records from outside sources obtained and reviewed including prior ED visits and prior Inpatient records.    Problem List / ED Course:  ETOH intoxication   Reevaluation:  After the interventions noted above, I reevaluated the patient and found that they have: improved  Disposition:  After consideration of the diagnostic results and the patients response to treatment, I feel that the patent would benefit from close outpatient followup.          Final Clinical Impression(s) / ED Diagnoses Final diagnoses:  Alcoholic intoxication without complication [facility])    Rx / DC Orders ED Discharge Orders     None         Wynetta Fines, MD 12/25/22 1626    Wynetta Fines, MD 12/25/22 1827

## 2023-01-06 ENCOUNTER — Other Ambulatory Visit: Payer: Self-pay | Admitting: Physician Assistant

## 2023-01-06 DIAGNOSIS — F411 Generalized anxiety disorder: Secondary | ICD-10-CM

## 2023-03-09 ENCOUNTER — Other Ambulatory Visit: Payer: Self-pay

## 2023-03-09 ENCOUNTER — Encounter (HOSPITAL_COMMUNITY): Payer: Self-pay

## 2023-03-09 ENCOUNTER — Emergency Department (HOSPITAL_COMMUNITY)
Admission: EM | Admit: 2023-03-09 | Discharge: 2023-03-09 | Discharge status: Against Medical Advice | Payer: Commercial Managed Care - HMO | Attending: Emergency Medicine | Admitting: Emergency Medicine

## 2023-03-09 DIAGNOSIS — Z5900 Homelessness unspecified: Secondary | ICD-10-CM | POA: Insufficient documentation

## 2023-03-09 DIAGNOSIS — Z5321 Procedure and treatment not carried out due to patient leaving prior to being seen by health care provider: Secondary | ICD-10-CM | POA: Insufficient documentation

## 2023-03-09 DIAGNOSIS — X58XXXA Exposure to other specified factors, initial encounter: Secondary | ICD-10-CM | POA: Insufficient documentation

## 2023-03-09 DIAGNOSIS — S91002A Unspecified open wound, left ankle, initial encounter: Secondary | ICD-10-CM | POA: Diagnosis present

## 2023-03-09 NOTE — ED Triage Notes (Signed)
Pt reports that he has a wound on medial aspect of his left ankle x 3 days. Pt is homeless and reports that he walks a lot.

## 2023-04-18 ENCOUNTER — Encounter (HOSPITAL_COMMUNITY): Payer: Self-pay | Admitting: Emergency Medicine

## 2023-04-18 ENCOUNTER — Other Ambulatory Visit: Payer: Self-pay

## 2023-04-18 ENCOUNTER — Emergency Department (HOSPITAL_COMMUNITY)
Admission: EM | Admit: 2023-04-18 | Discharge: 2023-04-18 | Disposition: A | Discharge status: Home / Self Care | Payer: Commercial Managed Care - HMO | Attending: Emergency Medicine | Admitting: Emergency Medicine

## 2023-04-18 DIAGNOSIS — S60142A Contusion of left ring finger with damage to nail, initial encounter: Secondary | ICD-10-CM | POA: Diagnosis present

## 2023-04-18 DIAGNOSIS — X398XXA Other exposure to forces of nature, initial encounter: Secondary | ICD-10-CM

## 2023-04-18 DIAGNOSIS — X31XXXA Exposure to excessive natural cold, initial encounter: Secondary | ICD-10-CM | POA: Diagnosis not present

## 2023-04-18 DIAGNOSIS — S6010XA Contusion of unspecified finger with damage to nail, initial encounter: Secondary | ICD-10-CM

## 2023-04-18 NOTE — ED Provider Notes (Signed)
[facility]-EMERGENCY DEPT [facility] Emergency Department Provider Note MRN:  [id number]  Arrival date & time: 04/18/23     Chief Complaint   Cold Exposure   History of Present Illness   Ricky Wu is a 38 y.o. year-old male presents to the ED with chief complaint of being cold.  He states that he has been out walking around in the cold tonight.  Doesn't have anywhere to stay.  He states that he started to get concerned about frostbite and came to the ER to warm up.  He states that he is primarily concerned about warming up, but also asks about hie left fingernail that he says is in the process of falling off.  History provided by patient.   Review of Systems  Pertinent positive and negative review of systems noted in HPI.    Physical Exam   Vitals:   04/18/23 0418 04/18/23 0432  BP: (!) 159/112 (!) 163/116  Pulse: 96 (!) 101  Resp: 18 16  Temp:  (!) 97.4 F (36.3 C)  SpO2: 100% 100%    CONSTITUTIONAL:  non toxic-appearing, NAD NEURO:  Alert and oriented x 3, CN 3-12 grossly intact EYES:  eyes equal and reactive ENT/NECK:  Supple, no stridor  CARDIO:  normal rate, regular rhythm, appears well-perfused  PULM:  No respiratory distress, CTAB GI/GU:  non-distended,  MSK/SPINE:  No gross deformities, no edema, moves all extremities  SKIN:  no rash, atraumatic, old appearing subungual hematoma to left ring finger nail   *Additional and/or pertinent findings included in MDM below  Diagnostic and Interventional Summary    EKG Interpretation Date/Time:    Ventricular Rate:    PR Interval:    QRS Duration:    QT Interval:    QTC Calculation:   R Axis:      Text Interpretation:         Labs Reviewed - No data to display  No orders to display    Medications - No data to display   Procedures  /  Critical Care Procedures  ED Course and Medical Decision Making  I have reviewed the triage vital signs, the nursing notes, and pertinent available records from  the EMR.  Social Determinants Affecting Complexity of Care: Patient has no clinically significant social determinants affecting this chief complaint..   ED Course:    Medical Decision Making Patient because it is freezing cold outside.  He states that he just needed somewhere to come warm up.    He's feeling warmer now.  I've given him some extra socks.  He's had some food and beef broth.    Patient stable for discharge.         Consultants: No consultations were needed in caring for this patient.   Treatment and Plan: Emergency department workup does not suggest an emergent condition requiring admission or immediate intervention beyond  what has been performed at this time. The patient is safe for discharge and has  been instructed to return immediately for worsening symptoms, change in  symptoms or any other concerns    Final Clinical Impressions(s) / ED Diagnoses     ICD-10-CM   1. Exposure to weather condition, initial encounter  X39.8XXA     2. Subungual hematoma of digit of hand, initial encounter  S60.10XA       ED Discharge Orders     None         Discharge Instructions Discussed with and Provided to Patient:   Discharge Instructions  None      Roxy Horseman, PA-C 04/18/23 0625    Melene Plan, DO 04/18/23 [phone]

## 2023-04-18 NOTE — ED Notes (Signed)
Unable to obtain temp at this time, pt cold

## 2023-04-18 NOTE — ED Notes (Signed)
Provided pt with beef broth, sprite and sandwich. Provided pt with warm blankets.

## 2023-04-18 NOTE — ED Triage Notes (Signed)
Pt reports he has been outside the cold for awhile due to being homeless, concerned for frostbite to fingers and toes, would like to get warm, and expresses concern for left ringer fingernail

## 2023-07-26 ENCOUNTER — Other Ambulatory Visit (HOSPITAL_COMMUNITY): Payer: Self-pay

## 2023-08-07 ENCOUNTER — Other Ambulatory Visit: Payer: Self-pay

## 2023-08-07 ENCOUNTER — Emergency Department (HOSPITAL_COMMUNITY)
Admission: EM | Admit: 2023-08-07 | Discharge: 2023-08-07 | Disposition: A | Discharge status: Home / Self Care | Payer: MEDICAID | Attending: Emergency Medicine | Admitting: Emergency Medicine

## 2023-08-07 DIAGNOSIS — S50812A Abrasion of left forearm, initial encounter: Secondary | ICD-10-CM | POA: Diagnosis not present

## 2023-08-07 DIAGNOSIS — S50811A Abrasion of right forearm, initial encounter: Secondary | ICD-10-CM | POA: Diagnosis not present

## 2023-08-07 DIAGNOSIS — T754XXA Electrocution, initial encounter: Secondary | ICD-10-CM | POA: Diagnosis not present

## 2023-08-07 DIAGNOSIS — Z23 Encounter for immunization: Secondary | ICD-10-CM | POA: Diagnosis not present

## 2023-08-07 DIAGNOSIS — S0001XA Abrasion of scalp, initial encounter: Secondary | ICD-10-CM | POA: Insufficient documentation

## 2023-08-07 DIAGNOSIS — W868XXA Exposure to other electric current, initial encounter: Secondary | ICD-10-CM | POA: Diagnosis not present

## 2023-08-07 DIAGNOSIS — S59911A Unspecified injury of right forearm, initial encounter: Secondary | ICD-10-CM | POA: Diagnosis present

## 2023-08-07 DIAGNOSIS — W19XXXA Unspecified fall, initial encounter: Secondary | ICD-10-CM

## 2023-08-07 MED ORDER — TETANUS-DIPHTH-ACELL PERTUSSIS 5-2.5-18.5 LF-MCG/0.5 IM SUSY
0.5000 mL | PREFILLED_SYRINGE | Freq: Once | INTRAMUSCULAR | Status: AC
Start: 2023-08-07 — End: 2023-08-07
  Administered 2023-08-07: 0.5 mL via INTRAMUSCULAR
  Filled 2023-08-07: qty 0.5

## 2023-08-07 NOTE — Discharge Instructions (Signed)
 Have your blood pressure checked again in the next week.  Take Tylenol  for any pain

## 2023-08-07 NOTE — ED Provider Notes (Signed)
 [place] EMERGENCY DEPARTMENT AT [facility] Provider Note   CSN: [id number] Arrival date & time: 08/07/23  0941     History  Chief Complaint  Patient presents with   Arm Injury    Pt arrives with police who used force to arrest him. Pt reports he has L and R arm injuries from being taken town, has laceration on R side of head not bleeding at this time. Etoh abuse, last drink today. Chronic sob    CHRISTPHOR Wu is a 38 y.o. male.  Patient was arrested by the police.  They brought him in because he has multiple abrasions and he was tased.  The history is provided by the patient and the police. No language interpreter was used.  Fall This is a new problem. The current episode started 3 to 5 hours ago. The problem occurs rarely. The problem has been resolved. Pertinent negatives include no chest pain, no abdominal pain and no headaches. Nothing aggravates the symptoms. Nothing relieves the symptoms. He has tried nothing for the symptoms.       Home Medications Prior to Admission medications   Medication Sig Start Date End Date Taking? Authorizing Provider  buPROPion  (WELLBUTRIN  XL) 150 MG 24 hr tablet Take 1 tablet (150 mg total) by mouth daily. 08/13/22   Buena Carmine, NP  chlordiazePOXIDE  (LIBRIUM ) 25 MG capsule 50mg  PO TID x 1D, then 25-50mg  PO BID X 1D, then 25-50mg  PO QD X 1D 11/06/22   Auston Blush, MD  gabapentin  (NEURONTIN ) 300 MG capsule Take 1 capsule (300 mg total) by mouth 3 (three) times daily. 08/13/22   Buena Carmine, NP  hydrOXYzine  (ATARAX ) 25 MG tablet Take 1 tablet (25 mg total) by mouth 3 (three) times daily as needed for anxiety. 08/13/22   Buena Carmine, NP  sertraline  (ZOLOFT ) 50 MG tablet Take 1 tablet (50 mg total) by mouth daily. 08/23/22   Gowens, Mariah L, PA-C      Allergies    Patient has no known allergies.    Review of Systems   Review of Systems  Constitutional:  Negative for appetite change and fatigue.  HENT:   Negative for congestion, ear discharge and sinus pressure.   Eyes:  Negative for discharge.  Respiratory:  Negative for cough.   Cardiovascular:  Negative for chest pain.  Gastrointestinal:  Negative for abdominal pain and diarrhea.  Genitourinary:  Negative for frequency and hematuria.  Musculoskeletal:  Negative for back pain.  Skin:  Negative for rash.  Neurological:  Negative for seizures and headaches.  Psychiatric/Behavioral:  Negative for hallucinations.     Physical Exam Updated Vital Signs BP (!) 160/116 (BP Location: Right Arm)   Pulse 89   Temp (!) 97.5 F (36.4 C) (Oral)   Resp 18   Ht 6\' 2"  (1.88 m)   Wt 90.7 kg   SpO2 100%   BMI 25.68 kg/m  Physical Exam Vitals and nursing note reviewed.  Constitutional:      Appearance: He is well-developed.  HENT:     Head: Normocephalic.     Nose: Nose normal.  Eyes:     General: No scleral icterus.    Conjunctiva/sclera: Conjunctivae normal.  Neck:     Thyroid : No thyromegaly.  Cardiovascular:     Rate and Rhythm: Normal rate and regular rhythm.     Heart sounds: No murmur heard.    No friction rub. No gallop.  Pulmonary:     Breath sounds: No stridor.  No wheezing or rales.  Chest:     Chest wall: No tenderness.  Abdominal:     General: There is no distension.     Tenderness: There is no abdominal tenderness. There is no rebound.  Musculoskeletal:        General: Normal range of motion.     Cervical back: Neck supple.  Lymphadenopathy:     Cervical: No cervical adenopathy.  Skin:    Findings: No erythema or rash.     Comments: Abrasion to arms and scalp  Neurological:     Mental Status: He is alert and oriented to person, place, and time.     Motor: No abnormal muscle tone.     Coordination: Coordination normal.  Psychiatric:        Behavior: Behavior normal.     ED Results / Procedures / Treatments   Labs (all labs ordered are listed, but only abnormal results are displayed) Labs Reviewed - No data  to display  EKG None  Radiology No results found.  Procedures Procedures    Medications Ordered in ED Medications  Tdap (BOOSTRIX ) injection 0.5 mL (has no administration in time range)    ED Course/ Medical Decision Making/ A&P                                 Medical Decision Making Risk Prescription drug management.   Patient with nonsuturable abrasions to forearms and head.  These abrasions will be cleaned and dressed.  He also has hypertension.  Patient will follow-up to have his blood pressure rechecked in the next week        Final Clinical Impression(s) / ED Diagnoses Final diagnoses:  Fall, initial encounter    Rx / DC Orders ED Discharge Orders     None         Cheyenne Cotta, MD 08/07/23 [phone]

## 2023-08-07 NOTE — ED Notes (Signed)
 Cleaned abrasions on elbows and face with NS, bandage applied to left elbow.
